# Patient Record
Sex: Female | Born: 1975 | Race: White | Hispanic: No | Marital: Married | Smoking: Never smoker
Health system: Southern US, Community
[De-identification: ages and names within clinical notes are randomized; demographics above are authoritative.]

## PROBLEM LIST (undated history)

## (undated) ENCOUNTER — Emergency Department: Admission: EM | Discharge status: Absent without leave | Payer: BC Managed Care – PPO

## (undated) DIAGNOSIS — F329 Major depressive disorder, single episode, unspecified: Secondary | ICD-10-CM

## (undated) DIAGNOSIS — R259 Unspecified abnormal involuntary movements: Secondary | ICD-10-CM

## (undated) DIAGNOSIS — N183 Chronic kidney disease, stage 3 unspecified: Secondary | ICD-10-CM

## (undated) DIAGNOSIS — H547 Unspecified visual loss: Secondary | ICD-10-CM

## (undated) DIAGNOSIS — R251 Tremor, unspecified: Secondary | ICD-10-CM

## (undated) DIAGNOSIS — G43909 Migraine, unspecified, not intractable, without status migrainosus: Secondary | ICD-10-CM

## (undated) DIAGNOSIS — F32A Depression, unspecified: Secondary | ICD-10-CM

## (undated) DIAGNOSIS — I10 Essential (primary) hypertension: Secondary | ICD-10-CM

## (undated) DIAGNOSIS — E119 Type 2 diabetes mellitus without complications: Secondary | ICD-10-CM

## (undated) HISTORY — PX: OTHER SURGICAL HISTORY: SHX169

## (undated) HISTORY — PX: TUBAL LIGATION: SHX77

## (undated) HISTORY — PX: FRACTURE SURGERY: SHX138

---

## 2004-03-11 ENCOUNTER — Ambulatory Visit: Payer: Self-pay | Admitting: Chiropractic Medicine

## 2005-09-08 ENCOUNTER — Ambulatory Visit: Payer: Self-pay | Admitting: Obstetrics and Gynecology

## 2006-02-20 ENCOUNTER — Encounter: Payer: Self-pay | Admitting: Maternal & Fetal Medicine

## 2006-02-24 ENCOUNTER — Ambulatory Visit: Payer: Self-pay | Admitting: Obstetrics and Gynecology

## 2006-03-01 ENCOUNTER — Ambulatory Visit: Payer: Self-pay | Admitting: Obstetrics and Gynecology

## 2006-03-02 ENCOUNTER — Ambulatory Visit: Payer: Self-pay | Admitting: Obstetrics and Gynecology

## 2006-03-03 ENCOUNTER — Ambulatory Visit: Payer: Self-pay | Admitting: Obstetrics and Gynecology

## 2007-07-23 ENCOUNTER — Encounter: Payer: Self-pay | Admitting: Obstetrics and Gynecology

## 2007-07-30 ENCOUNTER — Encounter: Payer: Self-pay | Admitting: Maternal and Fetal Medicine

## 2007-08-06 ENCOUNTER — Encounter: Payer: Self-pay | Admitting: Maternal & Fetal Medicine

## 2007-08-13 ENCOUNTER — Encounter: Payer: Self-pay | Admitting: Maternal & Fetal Medicine

## 2007-09-03 ENCOUNTER — Encounter: Payer: Self-pay | Admitting: Obstetrics and Gynecology

## 2007-09-27 ENCOUNTER — Encounter: Payer: Self-pay | Admitting: Maternal and Fetal Medicine

## 2007-10-01 ENCOUNTER — Encounter: Payer: Self-pay | Admitting: Maternal & Fetal Medicine

## 2007-10-08 ENCOUNTER — Encounter: Payer: Self-pay | Admitting: Maternal & Fetal Medicine

## 2007-10-15 ENCOUNTER — Encounter: Payer: Self-pay | Admitting: Maternal & Fetal Medicine

## 2007-10-22 ENCOUNTER — Encounter: Payer: Self-pay | Admitting: Maternal & Fetal Medicine

## 2007-11-19 ENCOUNTER — Observation Stay: Payer: Self-pay | Admitting: Obstetrics and Gynecology

## 2008-08-16 ENCOUNTER — Ambulatory Visit: Payer: Self-pay | Admitting: Family Medicine

## 2010-09-03 ENCOUNTER — Emergency Department: Payer: Self-pay | Admitting: Emergency Medicine

## 2011-10-14 DIAGNOSIS — E119 Type 2 diabetes mellitus without complications: Secondary | ICD-10-CM | POA: Insufficient documentation

## 2012-03-12 ENCOUNTER — Emergency Department: Payer: Self-pay | Admitting: Emergency Medicine

## 2012-03-12 LAB — CK TOTAL AND CKMB (NOT AT ARMC): CK, Total: 49 U/L (ref 21–215)

## 2012-03-12 LAB — BASIC METABOLIC PANEL
Anion Gap: 10 (ref 7–16)
BUN: 13 mg/dL (ref 7–18)
Chloride: 101 mmol/L (ref 98–107)
Glucose: 345 mg/dL — ABNORMAL HIGH (ref 65–99)
Osmolality: 284 (ref 275–301)
Potassium: 4.2 mmol/L (ref 3.5–5.1)

## 2012-03-12 LAB — CBC
HCT: 38.2 % (ref 35.0–47.0)
HGB: 13.2 g/dL (ref 12.0–16.0)
MCHC: 34.5 g/dL (ref 32.0–36.0)
RBC: 4.46 10*6/uL (ref 3.80–5.20)
WBC: 8.4 10*3/uL (ref 3.6–11.0)

## 2012-03-12 LAB — TROPONIN I: Troponin-I: <0.02 ng/mL

## 2012-03-13 LAB — URINALYSIS, COMPLETE
Bilirubin,UR: NEGATIVE
Ketone: NEGATIVE
Leukocyte Esterase: NEGATIVE
Nitrite: POSITIVE
Ph: 5 (ref 4.5–8.0)
Protein: NEGATIVE
RBC,UR: NONE SEEN /HPF (ref 0–5)
WBC UR: 1 /HPF (ref 0–5)

## 2012-10-09 ENCOUNTER — Emergency Department: Payer: Self-pay | Admitting: Emergency Medicine

## 2012-10-12 LAB — BETA STREP CULTURE(ARMC)

## 2013-09-23 ENCOUNTER — Emergency Department: Payer: Self-pay | Admitting: Emergency Medicine

## 2013-12-29 ENCOUNTER — Emergency Department: Payer: Self-pay | Admitting: Emergency Medicine

## 2013-12-29 LAB — TROPONIN I: Troponin-I: <0.02 ng/mL

## 2013-12-29 LAB — BASIC METABOLIC PANEL
Anion Gap: 9 (ref 7–16)
BUN: 9 mg/dL (ref 7–18)
CALCIUM: 8.5 mg/dL (ref 8.5–10.1)
CHLORIDE: 106 mmol/L (ref 98–107)
CREATININE: 0.94 mg/dL (ref 0.60–1.30)
Co2: 24 mmol/L (ref 21–32)
EGFR (African American): >60
EGFR (Non-African Amer.): >60
Glucose: 384 mg/dL — ABNORMAL HIGH (ref 65–99)
OSMOLALITY: 292 (ref 275–301)
POTASSIUM: 3.9 mmol/L (ref 3.5–5.1)
Sodium: 139 mmol/L (ref 136–145)

## 2013-12-29 LAB — CBC
HCT: 39.8 % (ref 35.0–47.0)
HGB: 13 g/dL (ref 12.0–16.0)
MCH: 29.2 pg (ref 26.0–34.0)
MCHC: 32.7 g/dL (ref 32.0–36.0)
MCV: 89 fL (ref 80–100)
Platelet: 231 10*3/uL (ref 150–440)
RBC: 4.45 10*6/uL (ref 3.80–5.20)
RDW: 12.9 % (ref 11.5–14.5)
WBC: 8.3 10*3/uL (ref 3.6–11.0)

## 2014-04-23 ENCOUNTER — Ambulatory Visit: Payer: Self-pay | Admitting: Family Medicine

## 2015-05-01 ENCOUNTER — Ambulatory Visit (INDEPENDENT_AMBULATORY_CARE_PROVIDER_SITE_OTHER): Payer: BLUE CROSS/BLUE SHIELD

## 2015-05-01 ENCOUNTER — Ambulatory Visit
Admission: EM | Admit: 2015-05-01 | Discharge: 2015-05-01 | Disposition: A | Discharge status: Home / Self Care | Payer: BLUE CROSS/BLUE SHIELD | Attending: Family Medicine | Admitting: Family Medicine

## 2015-05-01 ENCOUNTER — Encounter: Payer: Self-pay | Admitting: Emergency Medicine

## 2015-05-01 DIAGNOSIS — J019 Acute sinusitis, unspecified: Secondary | ICD-10-CM | POA: Diagnosis not present

## 2015-05-01 DIAGNOSIS — J209 Acute bronchitis, unspecified: Secondary | ICD-10-CM

## 2015-05-01 DIAGNOSIS — J029 Acute pharyngitis, unspecified: Secondary | ICD-10-CM

## 2015-05-01 HISTORY — DX: Essential (primary) hypertension: I10

## 2015-05-01 HISTORY — DX: Type 2 diabetes mellitus without complications: E11.9

## 2015-05-01 LAB — RAPID INFLUENZA A&B ANTIGENS (ARMC ONLY)
INFLUENZA A (ARMC): NOT DETECTED
INFLUENZA B (ARMC): NOT DETECTED

## 2015-05-01 LAB — RAPID STREP SCREEN (MED CTR MEBANE ONLY): STREPTOCOCCUS, GROUP A SCREEN (DIRECT): NEGATIVE

## 2015-05-01 MED ORDER — IPRATROPIUM-ALBUTEROL 0.5-2.5 (3) MG/3ML IN SOLN
3.0000 mL | Freq: Four times a day (QID) | RESPIRATORY_TRACT | Status: DC
  Administered 2015-05-01: 3 mL via RESPIRATORY_TRACT

## 2015-05-01 MED ORDER — SALINE SPRAY 0.65 % NA SOLN: 2.0000 | NASAL | Status: DC

## 2015-05-01 MED ORDER — ALBUTEROL SULFATE HFA 108 (90 BASE) MCG/ACT IN AERS: 1.0000 | INHALATION_SPRAY | Freq: Four times a day (QID) | RESPIRATORY_TRACT | Status: DC | PRN

## 2015-05-01 MED ORDER — FLUTICASONE PROPIONATE 50 MCG/ACT NA SUSP: 1.0000 | Freq: Two times a day (BID) | NASAL | Status: DC

## 2015-05-01 MED ORDER — BENZONATATE 200 MG PO CAPS: 200.0000 mg | ORAL_CAPSULE | Freq: Three times a day (TID) | ORAL | Status: AC | PRN

## 2015-05-01 MED ORDER — DOXYCYCLINE HYCLATE 50 MG PO CAPS: 50.0000 mg | ORAL_CAPSULE | Freq: Two times a day (BID) | ORAL | Status: AC

## 2015-05-01 NOTE — Discharge Instructions (Signed)
Acute Bronchitis Bronchitis is inflammation of the airways that extend from the windpipe into the lungs (bronchi). The inflammation often causes mucus to develop. This leads to a cough, which is the most common symptom of bronchitis.  In acute bronchitis, the condition usually develops suddenly and goes away over time, usually in a couple weeks. Smoking, allergies, and asthma can make bronchitis worse. Repeated episodes of bronchitis may cause further lung problems.  CAUSES Acute bronchitis is most often caused by the same virus that causes a cold. The virus can spread from person to person (contagious) through coughing, sneezing, and touching contaminated objects. SIGNS AND SYMPTOMS  1. Cough.  2. Fever.  3. Coughing up mucus.  4. Body aches.  5. Chest congestion.  6. Chills.  7. Shortness of breath.  8. Sore throat.  DIAGNOSIS  Acute bronchitis is usually diagnosed through a physical exam. Your health care provider will also ask you questions about your medical history. Tests, such as chest X-rays, are sometimes done to rule out other conditions.  TREATMENT  Acute bronchitis usually goes away in a couple weeks. Oftentimes, no medical treatment is necessary. Medicines are sometimes given for relief of fever or cough. Antibiotic medicines are usually not needed but may be prescribed in certain situations. In some cases, an inhaler may be recommended to help reduce shortness of breath and control the cough. A cool mist vaporizer may also be used to help thin bronchial secretions and make it easier to clear the chest.  HOME CARE INSTRUCTIONS  Get plenty of rest.   Drink enough fluids to keep your urine clear or pale yellow (unless you have a medical condition that requires fluid restriction). Increasing fluids may help thin your respiratory secretions (sputum) and reduce chest congestion, and it will prevent dehydration.   Take medicines only as directed by your health care  provider.  If you were prescribed an antibiotic medicine, finish it all even if you start to feel better.  Avoid smoking and secondhand smoke. Exposure to cigarette smoke or irritating chemicals will make bronchitis worse. If you are a smoker, consider using nicotine gum or skin patches to help control withdrawal symptoms. Quitting smoking will help your lungs heal faster.   Reduce the chances of another bout of acute bronchitis by washing your hands frequently, avoiding people with cold symptoms, and trying not to touch your hands to your mouth, nose, or eyes.   Keep all follow-up visits as directed by your health care provider.  SEEK MEDICAL CARE IF: Your symptoms do not improve after 1 week of treatment.  SEEK IMMEDIATE MEDICAL CARE IF:  You develop an increased fever or chills.   You have chest pain.   You have severe shortness of breath.  You have bloody sputum.   You develop dehydration.  You faint or repeatedly feel like you are going to pass out.  You develop repeated vomiting.  You develop a severe headache. MAKE SURE YOU:   Understand these instructions.  Will watch your condition.  Will get help right away if you are not doing well or get worse.   This information is not intended to replace advice given to you by your health care provider. Make sure you discuss any questions you have with your health care provider.   Document Released: 05/26/2004 Document Revised: 05/09/2014 Document Reviewed: 10/09/2012 Elsevier Interactive Patient Education 2016 [employer]  Pharyngitis Pharyngitis is redness, pain, and swelling (inflammation) of your pharynx.  CAUSES  Pharyngitis is usually caused  by infection. Most of the time, these infections are from viruses (viral) and are part of a cold. However, sometimes pharyngitis is caused by bacteria (bacterial). Pharyngitis can also be caused by allergies. Viral pharyngitis may be spread from person to person by  coughing, sneezing, and personal items or utensils (cups, forks, spoons, toothbrushes). Bacterial pharyngitis may be spread from person to person by more intimate contact, such as kissing.  SIGNS AND SYMPTOMS  Symptoms of pharyngitis include:  9. Sore throat.  10. Tiredness (fatigue).  11. Low-grade fever.  12. Headache. 13. Joint pain and muscle aches. 14. Skin rashes. 15. Swollen lymph nodes. 16. Plaque-like film on throat or tonsils (often seen with bacterial pharyngitis). DIAGNOSIS  Your health care provider will ask you questions about your illness and your symptoms. Your medical history, along with a physical exam, is often all that is needed to diagnose pharyngitis. Sometimes, a rapid strep test is done. Other lab tests may also be done, depending on the suspected cause.  TREATMENT  Viral pharyngitis will usually get better in 3-4 days without the use of medicine. Bacterial pharyngitis is treated with medicines that kill germs (antibiotics).  HOME CARE INSTRUCTIONS   Drink enough water and fluids to keep your urine clear or pale yellow.   Only take over-the-counter or prescription medicines as directed by your health care provider:   If you are prescribed antibiotics, make sure you finish them even if you start to feel better.   Do not take aspirin.   Get lots of rest.   Gargle with 8 oz of salt water ( tsp of salt per 1 qt of water) as often as every 1-2 hours to soothe your throat.   Throat lozenges (if you are not at risk for choking) or sprays may be used to soothe your throat. SEEK MEDICAL CARE IF:   You have large, tender lumps in your neck.  You have a rash.  You cough up green, yellow-brown, or bloody spit. SEEK IMMEDIATE MEDICAL CARE IF:   Your neck becomes stiff.  You drool or are unable to swallow liquids.  You vomit or are unable to keep medicines or liquids down.  You have severe pain that does not go away with the use of recommended  medicines.  You have trouble breathing (not caused by a stuffy nose). MAKE SURE YOU:   Understand these instructions.  Will watch your condition.  Will get help right away if you are not doing well or get worse.   This information is not intended to replace advice given to you by your health   Metered Dose Inhaler (No Spacer Used) Inhaled medicines are the basis of treatment for asthma and other breathing problems. Inhaled medicine can only be effective if used properly. Good technique assures that the medicine reaches the lungs. Metered dose inhalers (MDIs) are used to deliver a variety of inhaled medicines. These include quick relief or rescue medicines (such as bronchodilators) and controller medicines (such as corticosteroids). The medicine is delivered by pushing down on a metal canister to release a set amount of spray. If you are using different kinds of inhalers, use your quick relief medicine to open the airways 10-15 minutes before using a steroid, if instructed to do so by your health care provider. If you are unsure which inhalers to use and the order of using them, ask your health care provider, nurse, or respiratory therapist. HOW TO USE THE INHALER 17. Remove the cap from the inhaler. 18.  If you are using the inhaler for the first time, you will need to prime it. Shake the inhaler for 5 seconds and release four puffs into the air, away from your face. Ask your health care provider or pharmacist if you have questions about priming your inhaler. 19. Shake the inhaler for 5 seconds before each breath in (inhalation). 20. Position the inhaler so that the top of the canister faces up. 21. Put your index finger on the top of the medicine canister. Your thumb supports the bottom of the inhaler. 22. Open your mouth. 23. Either place the inhaler between your teeth and place your lips tightly around the mouthpiece, or hold the inhaler 1-2 inches away from your open mouth. If you are  unsure of which technique to use, ask your health care provider. 24. Breathe out (exhale) normally and as completely as possible. 25. Press the canister down with the index finger to release the medicine. 26. At the same time as the canister is pressed, inhale deeply and slowly until your lungs are completely filled. This should take 4-6 seconds. Keep your tongue down. 27. Hold the medicine in your lungs for 5-10 seconds (10 seconds is best). This helps the medicine get into the small airways of your lungs. 28. Breathe out slowly, through pursed lips. Whistling is an example of pursed lips. 29. Wait at least 1 minute between puffs. Continue with the above steps until you have taken the number of puffs your health care provider has ordered. Do not use the inhaler more than your health care provider directs you to. 30. Replace the cap on the inhaler. 31. Follow the directions from your health care provider or the inhaler insert for cleaning the inhaler. If you are using a steroid inhaler, after your last puff, rinse your mouth with water, gargle, and spit out the water. Do not swallow the water. AVOID:  Inhaling before or after starting the spray of medicine. It takes practice to coordinate your breathing with triggering the spray.  Inhaling through the nose (rather than the mouth) when triggering the spray. HOW TO DETERMINE IF YOUR INHALER IS FULL OR NEARLY EMPTY You cannot know when an inhaler is empty by shaking it. Some inhalers are now being made with dose counters. Ask your health care provider for a prescription that has a dose counter if you feel you need that extra help. If your inhaler does not have a counter, ask your health care provider to help you determine the date you need to refill your inhaler. Write the refill date on a calendar or your inhaler canister. Refill your inhaler 7-10 days before it runs out. Be sure to keep an adequate supply of medicine. This includes making sure it has  not expired, and making sure you have a spare inhaler. SEEK MEDICAL CARE IF:  Symptoms are only partially relieved with your inhaler.  You are having trouble using your inhaler.  You experience an increase in phlegm. SEEK IMMEDIATE MEDICAL CARE IF:  You feel little or no relief with your inhalers. You are still wheezing and feeling shortness of breath, tightness in your chest, or both.  You have dizziness, headaches, or a fast heart rate.  You have chills, fever, or night sweats.  There is a noticeable increase in phlegm production, or there is blood in the phlegm. MAKE SURE YOU:  Understand these instructions.  Will watch your condition.  Will get help right away if you are not doing well or get worse.  This information is not intended to replace advice given to you by your health care provider. Make sure you discuss any questions you have with your health care provider.   Document Released: 02/13/2007 Document Revised: 05/09/2014 Document Reviewed: 10/04/2012 Elsevier Interactive Patient Education 2016 [employer] Otitis Media With Effusion Otitis media with effusion is the presence of fluid in the middle ear. This is a common problem in children, which often follows ear infections. It may be present for weeks or longer after the infection. Unlike an acute ear infection, otitis media with effusion refers only to fluid behind the ear drum and not infection. Children with repeated ear and sinus infections and allergy problems are the most likely to get otitis media with effusion. CAUSES  The most frequent cause of the fluid buildup is dysfunction of the eustachian tubes. These are the tubes that drain fluid in the ears to the back of the nose (nasopharynx). SYMPTOMS  32. The main symptom of this condition is hearing loss. As a result, you or your child may: 1. Listen to the TV at a loud volume. 2. Not respond to questions. 3. Ask "what" often when spoken to. 4. Mistake or  confuse one sound or word for another. 33. There may be a sensation of fullness or pressure but usually not pain. DIAGNOSIS   Your health care provider will diagnose this condition by examining you or your child's ears.  Your health care provider may test the pressure in you or your child's ear with a tympanometer.  A hearing test may be conducted if the problem persists. TREATMENT   Treatment depends on the duration and the effects of the effusion.  Antibiotics, decongestants, nose drops, and cortisone-type drugs (tablets or nasal spray) may not be helpful.  Children with persistent ear effusions may have delayed language or behavioral problems. Children at risk for developmental delays in hearing, learning, and speech may require referral to a specialist earlier than children not at risk.  You or your child's health care provider may suggest a referral to an ear, nose, and throat surgeon for treatment. The following may help restore normal hearing:  Drainage of fluid.  Placement of ear tubes (tympanostomy tubes).  Removal of adenoids (adenoidectomy). HOME CARE INSTRUCTIONS   Avoid secondhand smoke.  Infants who are breastfed are less likely to have this condition.  Avoid feeding infants while they are lying flat.  Avoid known environmental allergens.  Avoid people who are sick. SEEK MEDICAL CARE IF:   Hearing is not better in 3 months.  Hearing is worse.  Ear pain.  Drainage from the ear.  Dizziness. MAKE SURE YOU:   Understand these instructions.  Will watch your condition.  Will get help right away if you are not doing well or get worse.   This information is not intended to replace advice given to you by your health care provider. Make sure you discuss any questions you have with your health care provider.   Document Released: 05/26/2004 Document Revised: 05/09/2014 Document Reviewed: 11/13/2012 Elsevier Interactive Patient Education 2016 [employer] Sinusitis, Adult Sinusitis is redness, soreness, and inflammation of the paranasal sinuses. Paranasal sinuses are air pockets within the bones of your face. They are located beneath your eyes, in the middle of your forehead, and above your eyes. In healthy paranasal sinuses, mucus is able to drain out, and air is able to circulate through them by way of your nose. However, when your paranasal sinuses are inflamed, mucus and air  can become trapped. This can allow bacteria and other germs to grow and cause infection. Sinusitis can develop quickly and last only a short time (acute) or continue over a long period (chronic). Sinusitis that lasts for more than 12 weeks is considered chronic. CAUSES Causes of sinusitis include: 34. Allergies. 35. Structural abnormalities, such as displacement of the cartilage that separates your nostrils (deviated septum), which can decrease the air flow through your nose and sinuses and affect sinus drainage. 36. Functional abnormalities, such as when the small hairs (cilia) that line your sinuses and help remove mucus do not work properly or are not present. SIGNS AND SYMPTOMS Symptoms of acute and chronic sinusitis are the same. The primary symptoms are pain and pressure around the affected sinuses. Other symptoms include:  Upper toothache.  Earache.  Headache.  Bad breath.  Decreased sense of smell and taste.  A cough, which worsens when you are lying flat.  Fatigue.  Fever.  Thick drainage from your nose, which often is green and may contain pus (purulent).  Swelling and warmth over the affected sinuses. DIAGNOSIS Your health care provider will perform a physical exam. During your exam, your health care provider may perform any of the following to help determine if you have acute sinusitis or chronic sinusitis:  Look in your nose for signs of abnormal growths in your nostrils (nasal polyps).  Tap over the affected sinus to check for signs of  infection.  View the inside of your sinuses using an imaging device that has a light attached (endoscope). If your health care provider suspects that you have chronic sinusitis, one or more of the following tests may be recommended:  Allergy tests.  Nasal culture. A sample of mucus is taken from your nose, sent to a lab, and screened for bacteria.  Nasal cytology. A sample of mucus is taken from your nose and examined by your health care provider to determine if your sinusitis is related to an allergy. TREATMENT Most cases of acute sinusitis are related to a viral infection and will resolve on their own within 10 days. Sometimes, medicines are prescribed to help relieve symptoms of both acute and chronic sinusitis. These may include pain medicines, decongestants, nasal steroid sprays, or saline sprays. However, for sinusitis related to a bacterial infection, your health care provider will prescribe antibiotic medicines. These are medicines that will help kill the bacteria causing the infection. Rarely, sinusitis is caused by a fungal infection. In these cases, your health care provider will prescribe antifungal medicine. For some cases of chronic sinusitis, surgery is needed. Generally, these are cases in which sinusitis recurs more than 3 times per year, despite other treatments. HOME CARE INSTRUCTIONS  Drink plenty of water. Water helps thin the mucus so your sinuses can drain more easily.  Use a humidifier.  Inhale steam 3-4 times a day (for example, sit in the bathroom with the shower running).  Apply a warm, moist washcloth to your face 3-4 times a day, or as directed by your health care provider.  Use saline nasal sprays to help moisten and clean your sinuses.  Take medicines only as directed by your health care provider.  If you were prescribed either an antibiotic or antifungal medicine, finish it all even if you start to feel better. SEEK IMMEDIATE MEDICAL CARE IF:  You have  increasing pain or severe headaches.  You have nausea, vomiting, or drowsiness.  You have swelling around your face.  You have vision problems.  You  have a stiff neck.  You have difficulty breathing.   This information is not intended to replace advice given to you by your health care provider. Make sure you discuss any questions you have with your health care provider.   Document Released: 04/18/2005 Document Revised: 05/09/2014 Document Reviewed: 05/03/2011 [employer] Interactive Patient Education [employer] care provider. Make sure you discuss any questions you have with your health care provider.   Document Released: 04/18/2005 Document Revised: 02/06/2013 Document Reviewed: 12/24/2012 [employer] Interactive Patient Education [employer]

## 2015-05-01 NOTE — ED Notes (Signed)
Cough, SOB, for 2 days

## 2015-05-01 NOTE — ED Provider Notes (Addendum)
CSN: [id number]     Arrival date & time 05/01/15  1707 History   First MD Initiated Contact with Patient 05/01/15 1850     Chief Complaint  Patient presents with  . Cough   (Consider location/radiation/quality/duration/timing/severity/associated sxs/prior Treatment) HPI Comments: Married caucasian female here for evaluation of shortness of breath after coughing.  FS 250-300 the past couple of mornings.  Usually 150-175 taking insuline as prescribed  Cough nonproductive dry, body aches, frontal headache.  wheezing only today denied asthma history Diarrhea Tuesday now resolvedPSHx BTL, c-sections x 2 works as Conservation officer, nature at [employer] Also on a blood pressure medication not 100% sure but thinks it is lisinopril 10mg  po daily  The history is provided by the patient and a relative.    Past Medical History  Diagnosis Date  . Diabetes mellitus without complication (HCC)   . Hypertension    Past Surgical History  Procedure Laterality Date  . Cesarean section    . Tubiligation     History reviewed. No pertinent family history. Social History  Substance Use Topics  . Smoking status: Never Smoker   . Smokeless tobacco: Never Used  . Alcohol Use: No   OB History    No data available     Review of Systems  Constitutional: Negative for fever, chills, diaphoresis, activity change, appetite change, fatigue and unexpected weight change.  HENT: Positive for congestion, ear pain, nosebleeds, postnasal drip, rhinorrhea, sinus pressure and sore throat. Negative for dental problem, drooling, ear discharge, facial swelling, hearing loss, mouth sores, sneezing, tinnitus, trouble swallowing and voice change.   Eyes: Negative for photophobia, pain, discharge, redness, itching and visual disturbance.  Respiratory: Positive for cough and wheezing. Negative for choking, chest tightness, shortness of breath and stridor.   Cardiovascular: Negative for chest pain, palpitations and leg swelling.  Gastrointestinal:  Positive for diarrhea. Negative for nausea, vomiting, abdominal pain, constipation, blood in stool and abdominal distention.  Endocrine: Negative for cold intolerance and heat intolerance.  Genitourinary: Negative for dysuria, hematuria and difficulty urinating.  Musculoskeletal: Negative for myalgias, back pain, joint swelling, arthralgias, gait problem, neck pain and neck stiffness.  Skin: Negative for color change, pallor, rash and wound.  Allergic/Immunologic: Positive for immunocompromised state. Negative for environmental allergies and food allergies.  Neurological: Positive for headaches. Negative for dizziness, tremors, seizures, syncope, facial asymmetry, speech difficulty, weakness, light-headedness and numbness.  Hematological: Negative for adenopathy. Does not bruise/bleed easily.  Psychiatric/Behavioral: Negative for behavioral problems, confusion, sleep disturbance and agitation.    Allergies  Review of patient's allergies indicates no known allergies.  Home Medications   Prior to Admission medications   Medication Sig Start Date End Date Taking? Authorizing Provider  insulin glargine (LANTUS) 100 UNIT/ML injection Inject into the skin at bedtime.   Yes Historical Provider, MD  insulin lispro (HUMALOG) 100 UNIT/ML injection Inject into the skin 3 (three) times daily before meals.   Yes Historical Provider, MD  albuterol (PROVENTIL HFA;VENTOLIN HFA) 108 (90 Base) MCG/ACT inhaler Inhale 1-2 puffs into the lungs every 6 (six) hours as needed for wheezing or shortness of breath. 05/01/15   Barbaraann Barthel, NP  benzonatate (TESSALON) 200 MG capsule Take 1 capsule (200 mg total) by mouth 3 (three) times daily as needed for cough. 05/01/15 05/15/15  Barbaraann Barthel, NP  doxycycline (VIBRAMYCIN) 50 MG capsule Take 1 capsule (50 mg total) by mouth 2 (two) times daily. 05/01/15 05/10/15  Barbaraann Barthel, NP  fluticasone (FLONASE) 50 MCG/ACT nasal spray Place 1  spray into both nostrils 2  (two) times daily. 05/01/15   Barbaraann Barthel, NP  sodium chloride (OCEAN) 0.65 % SOLN nasal spray Place 2 sprays into both nostrils every 2 (two) hours while awake. 05/01/15 06/01/15  Barbaraann Barthel, NP   Meds Ordered and Administered this Visit   Medications  ipratropium-albuterol (DUONEB) 0.5-2.5 (3) MG/3ML nebulizer solution 3 mL (3 mLs Nebulization Given 05/01/15 1903)    BP 156/79 mmHg  Pulse 76  Temp(Src) 98 F (36.7 C)  Resp 18  Ht 5\' 7"  (1.702 m)  Wt 224 lb 14.4 oz (102.014 kg)  BMI 35.22 kg/m2  SpO2 100%  LMP 04/13/2015 No data found.   Physical Exam  Constitutional: She is oriented to person, place, and time. She appears well-developed and well-nourished. She is active and cooperative.  Non-toxic appearance. She does not have a sickly appearance. She appears ill. No distress.  HENT:  Head: Normocephalic and atraumatic.  Right Ear: Hearing, external ear and ear canal normal. A middle ear effusion is present.  Left Ear: Hearing, external ear and ear canal normal. A middle ear effusion is present.  Nose: Mucosal edema and rhinorrhea present. No nose lacerations, sinus tenderness, nasal deformity, septal deviation or nasal septal hematoma. No epistaxis.  No foreign bodies. Right sinus exhibits frontal sinus tenderness. Right sinus exhibits no maxillary sinus tenderness. Left sinus exhibits frontal sinus tenderness. Left sinus exhibits no maxillary sinus tenderness.  Mouth/Throat: Uvula is midline and mucous membranes are normal. Mucous membranes are not pale, not dry and not cyanotic. She does not have dentures. No oral lesions. No trismus in the jaw. Normal dentition. No dental abscesses, uvula swelling, lacerations or dental caries. Posterior oropharyngeal edema and posterior oropharyngeal erythema present. No oropharyngeal exudate or tonsillar abscesses.  Tonsils enlarged erythema edema 2+ bilaterally; cobblestoning posterior pharynx; nasal congestion yellow discharge  bilaterally turbinates; bilateral TMs with air fluid level slight opacity  Eyes: Conjunctivae, EOM and lids are normal. Pupils are equal, round, and reactive to light. Right eye exhibits no chemosis, no discharge, no exudate and no hordeolum. No foreign body present in the right eye. Left eye exhibits no chemosis, no discharge, no exudate and no hordeolum. No foreign body present in the left eye. Right conjunctiva is not injected. Right conjunctiva has no hemorrhage. Left conjunctiva is not injected. Left conjunctiva has no hemorrhage. No scleral icterus. Right eye exhibits normal extraocular motion and no nystagmus. Left eye exhibits normal extraocular motion and no nystagmus. Right pupil is round and reactive. Left pupil is round and reactive. Pupils are equal.  Neck: Trachea normal and normal range of motion. Neck supple. No tracheal tenderness, no spinous process tenderness and no muscular tenderness present. No rigidity. No tracheal deviation, no edema, no erythema and normal range of motion present. No thyroid mass and no thyromegaly present.  Cardiovascular: Normal rate, regular rhythm, S1 normal, S2 normal, normal heart sounds and intact distal pulses.  PMI is not displaced.  Exam reveals no gallop and no friction rub.   No murmur heard. Pulmonary/Chest: Effort normal. No accessory muscle usage or stridor. No respiratory distress. She has decreased breath sounds in the right lower field and the left lower field. She has no wheezes. She has no rhonchi. She has no rales. She exhibits no tenderness.  Negative egophany all fields; BLL decreased breath sounds prior to duoneb improved after duoneb administration; intermittent cough with talking and deep breathing prior to duoneb  Abdominal: Soft. She exhibits no distension.  Musculoskeletal:  Normal range of motion. She exhibits no edema or tenderness.       Right shoulder: Normal.       Left shoulder: Normal.       Right hip: Normal.       Left hip:  Normal.       Right knee: Normal.       Left knee: Normal.       Cervical back: Normal.       Right hand: Normal.       Left hand: Normal.  Lymphadenopathy:       Head (right side): No submental, no submandibular, no tonsillar, no preauricular, no posterior auricular and no occipital adenopathy present.       Head (left side): No submental, no submandibular, no tonsillar, no preauricular, no posterior auricular and no occipital adenopathy present.    She has no cervical adenopathy.       Right cervical: No superficial cervical, no deep cervical and no posterior cervical adenopathy present.      Left cervical: No superficial cervical, no deep cervical and no posterior cervical adenopathy present.  Neurological: She is alert and oriented to person, place, and time. She has normal strength. She is not disoriented. She displays no atrophy and no tremor. No cranial nerve deficit or sensory deficit. She exhibits normal muscle tone. She displays no seizure activity. Coordination and gait normal. GCS eye subscore is 4. GCS verbal subscore is 5. GCS motor subscore is 6.  Skin: Skin is warm, dry and intact. No abrasion, no bruising, no burn, no ecchymosis, no laceration, no lesion, no petechiae and no rash noted. She is not diaphoretic. No cyanosis or erythema. No pallor. Nails show no clubbing.  Psychiatric: She has a normal mood and affect. Her speech is normal and behavior is normal. Judgment and thought content normal. Cognition and memory are normal.  Nursing note and vitals reviewed.  Daughter in room with patient; urine smell in room; patient wearing mask ED Course  Procedures (including critical care time)  Labs Review Labs Reviewed  R[facility])  RAPID INFLUENZA A&B ANTIGENS ([facility] ONLY)  CULTURE, GROUP A STREP ([facility] ONLY)    Imaging Review Dg Chest 2 View  05/01/2015  CLINICAL DATA:  Pt with chest pain and cough x two days. No hx of trauma, injury, surgery or asthma.  EXAM: CHEST  2 VIEW COMPARISON:  12/29/2013 FINDINGS: The heart size and mediastinal contours are within normal limits. Both lungs are clear. The visualized skeletal structures are unremarkable. IMPRESSION: No active cardiopulmonary disease. Electronically Signed   By: Norva PavlovElizabeth  Brown M.D.   On: 05/01/2015 19:23    1900 duoneb 3ml po administered by CMA Riesa PopeHelen Jeffries, chest xray pending.  1950 patient feeling better after duoneb sp02 100% room air HR 78 cough frequency decreased speaking in full sentences with children and spouse in room.  Discussed rapid flu and strep negative.  Throat culture results typically available in 48 hours will call once available.  Chest xray negative for pneumonia/fluid given copy of radiology report and discussed with patient.  Patient verbalized understanding of information/instructions, agreed with plan of crea and had no further questions at this time.    MDM   1. Acute bronchitis due to Haemophilus influenzae   2. Acute rhinosinusitis   3. Acute pharyngitis, unspecified pharyngitis type    Patient notified rapid strep and flu negative.  Suspect Viral illness: no evidence of invasive bacterial infection, non toxic and well  hydrated.  This is most likely self limiting viral infection.  I do not see where any further testing or imaging is necessary at this time.   I will suggest supportive care, rest, good hygiene and encourage the patient to take adequate fluids.   Notified patient staff will call with culture results once available next 48+ hours.  flonase 1 spray each nostril BID prn, nasal saline 1-2 sprays each nostril prn q2h, tylenol  po QID prn.  Discussed honey with lemon and salt water gargles for comfort also.  The patient is to return to clinic or EMERGENCY ROOM if symptoms worsen or change significantly e.g. fever, lethargy, SOB, wheezing.  Exitcare handout on viral illness given to patient.  Work excuse given 28-30 Dec.  Patient verbalized  agreement and understanding of treatment plan and had no further questions at this time.    Patient does not like nose sprays but sill try flonase 1 spray each nostril BID and nasal saline 2 sprays each nostril q2h prn congestion.  If no improvement x 48 hours use to start doxycycline  po BID x 10 days.  No evidence of systemic bacterial infection, non toxic and well hydrated.  I do not see where any further testing or imaging is necessary at this time.   I will suggest supportive care, rest, good hygiene and encourage the patient to take adequate fluids.  The patient is to return to clinic or EMERGENCY ROOM if symptoms worsen or change significantly.  Exitcare handout on sinusitis given to patient.  Patient verbalized agreement and understanding of treatment plan and had no further questions at this time.   P2:  Hand washing and cover cough  Will cover for atypical bronchitis with doxycycline  po BID.  Tessalon pearles  po TID prn and albuterol 1-2 puffs po q4-6h prn protracted cough/wheezing/chest tightness.  Holding on prednisone at this time due to diabetes on insulin with elevated blood sugars > 200 already.  Bronchitis simple, community acquired, may have started as viral (probably respiratory syncytial, parainfluenza, influenza, or adenovirus), but now evidence of acute purulent bronchitis with resultant bronchial edema and mucus formation.  Viruses are the most common cause of bronchial inflammation in otherwise healthy adults with acute bronchitis.  The appearance of sputum is not predictive of whether a bacterial infection is present.  Purulent sputum is most often caused by viral infections.  There are a small portion of those caused by non-viral agents being Mycoplamsa pneumonia.  Microscopic examination or C&S of sputum in the healthy adult with acute bronchitis is generally not helpful (usually negative or normal respiratory flora) other considerations being cough from upper  respiratory tract infections, sinusitis or allergic syndromes (mild asthma or viral pneumonia).  Differential Diagnosis:  reactive airway disease (asthma, allergic aspergillosis (eosinophilia), chronic bronchitis, respiratory infection (Sinusitis, Common cold, pneumonia), congestive heart failure, reflux esophagitis, bronchogenic tumor, aspiration syndromes and/or exposure irritants/tobacco smoke.  In this case, there is no evidence of any invasive bacterial illness.  Most likely viral etiology so will hold on antibiotic treatment.  Advise supportive care with rest, encourage fluids, good hygiene and watch for any worsening symptoms.  If they were to develop:  come back to the office or go to the emergency room if after hours. Without high fever, severe dyspnea, lack of physical findings or other risk factors, I will hold on CBC at this time.  I discussed that approximately 50% of patients with acute bronchitis have a cough that lasts up to three  weeks, and 25% for over a month.  Tylenol, one to two tablets every four hours as needed for fever or myalgias.   No aspirin.  Patient instructed to follow up in one week or sooner if symptoms worsen. Patient verbalized agreement and understanding of treatment plan and had no further questions at this time.  P2:  hand washing and cover cough  School/work excuse note given to patient for 72 hours as had missed work 28-30 Dec.  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  Has Rx for doxycycline for atypical bronchitis and sinusitis.  Discussed with patient if sore throat not improving with prescribed therapy x 48 hours and strep positive may need to add penicillin to her regimen.    Usually no specific medical treatment is needed if a virus is causing the sore throat.  The throat most often gets better on its own within 5 to 7 days.  Antibiotic medicine does not cure viral pharyngitis.   For acute pharyngitis caused by  bacteria, your healthcare provider will prescribe an antibiotic.  Marland Kitchen Do not smoke.  Marland Kitchen Avoid secondhand smoke and other air pollutants.  . Use a cool mist humidifier to add moisture to the air.  . Get plenty of rest.  . You may want to rest your throat by talking less and eating a diet that is mostly liquid or soft for a day or two.   Marland Kitchen Nonprescription throat lozenges and mouthwashes should help relieve the soreness.   . Gargling with warm saltwater and drinking warm liquids may help.  (You can make a saltwater solution by adding 1/4 teaspoon of salt to 8 ounces, or 240 mL, of warm water.)  . A nonprescription pain reliever such as aspirin, acetaminophen, or ibuprofen may ease general aches and pains.   FOLLOW UP with clinic provider if no improvements in the next 7-10 days.  exitcare handout on pharyngitis given to patient.  Patient verbalized understanding of instructions and agreed with plan of care. P2:  Hand washing and diet.     Barbaraann Barthel, NP 05/01/15 2038  05 May 2015 at 1020 Patient notified via telephone throat culture positive strep agalactiae.  Patient reported she started doxycycline and symptoms improved feeling much better.  Patient verbalized understanding of information/instructions, agreed with plan of care and had no further questions at this time.  Barbaraann Barthel, NP 05/05/15 1021

## 2015-05-06 ENCOUNTER — Telehealth: Payer: Self-pay | Admitting: *Deleted

## 2015-05-06 LAB — CULTURE, GROUP A STREP (THRC)

## 2015-05-06 NOTE — ED Notes (Signed)
Patient called, no answer, voicemail not available to leave message.

## 2015-06-28 ENCOUNTER — Ambulatory Visit
Admission: EM | Admit: 2015-06-28 | Discharge: 2015-06-28 | Disposition: A | Discharge status: Home / Self Care | Payer: BLUE CROSS/BLUE SHIELD | Attending: Family Medicine | Admitting: Family Medicine

## 2015-06-28 ENCOUNTER — Encounter: Payer: Self-pay | Admitting: Gynecology

## 2015-06-28 DIAGNOSIS — L02212 Cutaneous abscess of back [any part, except buttock]: Secondary | ICD-10-CM

## 2015-06-28 MED ORDER — SULFAMETHOXAZOLE-TRIMETHOPRIM 800-160 MG PO TABS: 1.0000 | ORAL_TABLET | Freq: Two times a day (BID) | ORAL | Status: DC

## 2015-06-28 NOTE — ED Provider Notes (Signed)
CSN: [id number]     Arrival date & time 06/28/15  2725 History   First MD Initiated Contact with Patient 06/28/15 1138     Chief Complaint  Patient presents with  . Recurrent Skin Infections   (Consider location/radiation/quality/duration/timing/severity/associated sxs/prior Treatment) Patient is a 40 y.o. female presenting with abscess. The history is provided by the patient.  Abscess Location:  Torso Torso abscess location:  Lower back Abscess quality: fluctuance, painful, redness and warmth   Abscess quality: not draining and not weeping   Red streaking: no   Duration:  3 days Progression:  Worsening Pain details:    Quality:  Throbbing   Severity:  Moderate   Duration:  2 days   Timing:  Constant Chronicity:  New Context: diabetes   Context: not immunosuppression, not injected drug use, not insect bite/sting and not skin injury   Relieved by:  Nothing Ineffective treatments:  Warm water soaks Associated symptoms: no anorexia, no fatigue, no fever, no headaches, no nausea and no vomiting   Risk factors: no family hx of MRSA and no hx of MRSA     Past Medical History  Diagnosis Date  . Diabetes mellitus without complication (HCC)   . Hypertension    Past Surgical History  Procedure Laterality Date  . Cesarean section    . Tubiligation     No family history on file. Social History  Substance Use Topics  . Smoking status: Never Smoker   . Smokeless tobacco: Never Used  . Alcohol Use: No   OB History    No data available     Review of Systems  Constitutional: Negative for fever and fatigue.  Gastrointestinal: Negative for nausea, vomiting and anorexia.  Neurological: Negative for headaches.    Allergies  Review of patient's allergies indicates no known allergies.  Home Medications   Prior to Admission medications   Medication Sig Start Date End Date Taking? Authorizing Provider  albuterol (PROVENTIL HFA;VENTOLIN HFA) 108 (90 Base) MCG/ACT inhaler Inhale  1-2 puffs into the lungs every 6 (six) hours as needed for wheezing or shortness of breath. 05/01/15  Yes Barbaraann Barthel, NP  fluticasone (FLONASE) 50 MCG/ACT nasal spray Place 1 spray into both nostrils 2 (two) times daily. 05/01/15  Yes Barbaraann Barthel, NP  insulin glargine (LANTUS) 100 UNIT/ML injection Inject into the skin at bedtime.   Yes Historical Provider, MD  insulin lispro (HUMALOG) 100 UNIT/ML injection Inject into the skin 3 (three) times daily before meals.   Yes Historical Provider, MD  metFORMIN (GLUCOPHAGE) 500 MG tablet Take 500 mg by mouth 1 day or 1 dose. 06/14/15 06/13/16  Historical Provider, MD  sodium chloride (OCEAN) 0.65 % SOLN nasal spray Place 2 sprays into both nostrils every 2 (two) hours while awake. 05/01/15 06/01/15  Barbaraann Barthel, NP  sulfamethoxazole-trimethoprim (BACTRIM DS,SEPTRA DS) 800-160 MG tablet Take 1 tablet by mouth 2 (two) times daily. 06/28/15   Payton Mccallum, MD  traMADol (ULTRAM) 50 MG tablet Take 1 tablet (50 mg total) by mouth 2 (two) times daily. 06/29/15   Charlesetta Ivory Menshew, PA-C   Meds Ordered and Administered this Visit  Medications - No data to display  BP 138/74 mmHg  Pulse 81  Temp(Src) 98.1 F (36.7 C) (Oral)  Resp 18  Ht  (1.702 m)  Wt 233 lb (105.688 kg)  BMI 36.48 kg/m2  SpO2 97%  LMP 06/03/2015 No data found.   Physical Exam  Constitutional: She appears well-developed and well-nourished.  No distress.  Skin: She is not diaphoretic. There is erythema.  3x7cm subcutaneous, fluctuant mass on left lower back, skin warmth, tenderness to palpation and blanchable erythema  Nursing note and vitals reviewed.   ED Course  Procedures (including critical care time)  Labs Review Labs Reviewed  CULTURE, ROUTINE-ABSCESS    Imaging Review No results found.   Visual Acuity Review  Right Eye Distance:   Left Eye Distance:   Bilateral Distance:    Right Eye Near:   Left Eye Near:    Bilateral Near:          MDM   1. Abscess of back    Discharge Medication List as of 06/28/2015 12:12 PM    START taking these medications   Details  sulfamethoxazole-trimethoprim (BACTRIM DS,SEPTRA DS) 800-160 MG tablet Take 1 tablet by mouth 2 (two) times daily., Starting 06/28/2015, Until Discontinued, Normal       1. diagnosis reviewed with patient; informed consent obtained for I&D; area cleaned, prepped and anesthetized with 1% lidocaine with epinephrine; small skin incision ( approx 3mm) made with #11 blade with subsequent expression of purulent material; sample obtained for culture 2. rx as per orders above; reviewed possible side effects, interactions, risks and benefits  3. Recommend supportive treatment with warm compresses to affected area 4. Follow-up prn if symptoms worsen or don't improve    Payton Mccallum, MD 07/02/15 1333

## 2015-06-28 NOTE — ED Notes (Signed)
Patient c/o boil on back x couple days.

## 2015-06-28 NOTE — Discharge Instructions (Signed)

## 2015-06-29 ENCOUNTER — Encounter: Payer: Self-pay | Admitting: Emergency Medicine

## 2015-06-29 ENCOUNTER — Emergency Department
Admission: EM | Admit: 2015-06-29 | Discharge: 2015-06-29 | Disposition: A | Discharge status: Home / Self Care | Payer: BLUE CROSS/BLUE SHIELD | Attending: Student | Admitting: Student

## 2015-06-29 DIAGNOSIS — E119 Type 2 diabetes mellitus without complications: Secondary | ICD-10-CM | POA: Diagnosis not present

## 2015-06-29 DIAGNOSIS — Z792 Long term (current) use of antibiotics: Secondary | ICD-10-CM | POA: Diagnosis not present

## 2015-06-29 DIAGNOSIS — I10 Essential (primary) hypertension: Secondary | ICD-10-CM | POA: Insufficient documentation

## 2015-06-29 DIAGNOSIS — Z794 Long term (current) use of insulin: Secondary | ICD-10-CM | POA: Diagnosis not present

## 2015-06-29 DIAGNOSIS — Z7951 Long term (current) use of inhaled steroids: Secondary | ICD-10-CM | POA: Insufficient documentation

## 2015-06-29 DIAGNOSIS — Z79899 Other long term (current) drug therapy: Secondary | ICD-10-CM | POA: Insufficient documentation

## 2015-06-29 DIAGNOSIS — L02212 Cutaneous abscess of back [any part, except buttock]: Secondary | ICD-10-CM | POA: Diagnosis present

## 2015-06-29 DIAGNOSIS — L02222 Furuncle of back [any part, except buttock]: Secondary | ICD-10-CM | POA: Insufficient documentation

## 2015-06-29 MED ORDER — TRAMADOL HCL 50 MG PO TABS: 50.0000 mg | ORAL_TABLET | Freq: Two times a day (BID) | ORAL | Status: DC

## 2015-06-29 NOTE — ED Notes (Signed)
States she was seen yesterday with abscess area to lower back  States it was lanced yesterday but no packing noted on arrival. Large red area noted to left lower back .  Having increased pain today

## 2015-06-29 NOTE — ED Notes (Signed)
C/o boil to back. NAD

## 2015-06-29 NOTE — Discharge Instructions (Signed)
Abscess An abscess is an infected area that contains a collection of pus and debris.It can occur in almost any part of the body. An abscess is also known as a furuncle or boil. CAUSES  An abscess occurs when tissue gets infected. This can occur from blockage of oil or sweat glands, infection of hair follicles, or a minor injury to the skin. As the body tries to fight the infection, pus collects in the area and creates pressure under the skin. This pressure causes pain. People with weakened immune systems have difficulty fighting infections and get certain abscesses more often.  SYMPTOMS Usually an abscess develops on the skin and becomes a painful mass that is red, warm, and tender. If the abscess forms under the skin, you may feel a moveable soft area under the skin. Some abscesses break open (rupture) on their own, but most will continue to get worse without care. The infection can spread deeper into the body and eventually into the bloodstream, causing you to feel ill.  DIAGNOSIS  Your caregiver will take your medical history and perform a physical exam. A sample of fluid may also be taken from the abscess to determine what is causing your infection. TREATMENT  Your caregiver may prescribe antibiotic medicines to fight the infection. However, taking antibiotics alone usually does not cure an abscess. Your caregiver may need to make a small cut (incision) in the abscess to drain the pus. In some cases, gauze is packed into the abscess to reduce pain and to continue draining the area. HOME CARE INSTRUCTIONS   Only take over-the-counter or prescription medicines for pain, discomfort, or fever as directed by your caregiver.  If you were prescribed antibiotics, take them as directed. Finish them even if you start to feel better.  If gauze is used, follow your caregiver's directions for changing the gauze.  To avoid spreading the infection:  Keep your draining abscess covered with a  bandage.  Wash your hands well.  Do not share personal care items, towels, or whirlpools with others.  Avoid skin contact with others.  Keep your skin and clothes clean around the abscess.  Keep all follow-up appointments as directed by your caregiver. SEEK MEDICAL CARE IF:   You have increased pain, swelling, redness, fluid drainage, or bleeding.  You have muscle aches, chills, or a general ill feeling.  You have a fever. MAKE SURE YOU:   Understand these instructions.  Will watch your condition.  Will get help right away if you are not doing well or get worse.   This information is not intended to replace advice given to you by your health care provider. Make sure you discuss any questions you have with your health care provider.   Document Released: 01/26/2005 Document Revised: 10/18/2011 Document Reviewed: 07/01/2011 Elsevier Interactive Patient Education 2016 [employer].   Continue to dose the antibiotic as prescribed. Apply warm compresses to promote healing. Follow-up with the general surgeon as scheduled, on Thursday.

## 2015-06-29 NOTE — ED Provider Notes (Signed)
[facility] Emergency Department Provider Note ____________________________________________  Time seen: 1750  I have reviewed the triage vital signs and the nursing notes.  HISTORY  Chief Complaint  Abscess  HPI Vickie Montoya is a 40 y.o. female presents to the ED one day status post local incision and drainage for a abscess to the lower back. Patient was seen and evaluated [place] urgent care yesterday following a three-day onset of a abscess to the lower back.She claims that she was placed on Bactrim and wound culture was collected. She reports today because she was not given any pain medicine and notes some increased discomfort to the area. She rates the pain at 8/10 in triage.  Past Medical History  Diagnosis Date  . Diabetes mellitus without complication (HCC)   . Hypertension     There are no active problems to display for this patient.   Past Surgical History  Procedure Laterality Date  . Cesarean section    . Tubiligation      Current Outpatient Rx  Name  Route  Sig  Dispense  Refill  . albuterol (PROVENTIL HFA;VENTOLIN HFA) 108 (90 Base) MCG/ACT inhaler   Inhalation   Inhale 1-2 puffs into the lungs every 6 (six) hours as needed for wheezing or shortness of breath.   1 Inhaler   0   . fluticasone (FLONASE) 50 MCG/ACT nasal spray   Each Nare   Place 1 spray into both nostrils 2 (two) times daily.   16 g   0   . insulin glargine (LANTUS) 100 UNIT/ML injection   Subcutaneous   Inject into the skin at bedtime.         . insulin lispro (HUMALOG) 100 UNIT/ML injection   Subcutaneous   Inject into the skin 3 (three) times daily before meals.         Marland Kitchen EXPIRED: sodium chloride (OCEAN) 0.65 % SOLN nasal spray   Each Nare   Place 2 sprays into both nostrils every 2 (two) hours while awake.      0   . sulfamethoxazole-trimethoprim (BACTRIM DS,SEPTRA DS) 800-160 MG tablet   Oral   Take 1 tablet by mouth 2 (two) times daily.   20  tablet   0   . traMADol (ULTRAM) 50 MG tablet   Oral   Take 1 tablet (50 mg total) by mouth 2 (two) times daily.   10 tablet   0     Allergies Review of patient's allergies indicates no known allergies.  History reviewed. No pertinent family history.  Social History Social History  Substance Use Topics  . Smoking status: Never Smoker   . Smokeless tobacco: Never Used  . Alcohol Use: No   Review of Systems  Constitutional: Negative for fever. Eyes: Negative for visual changes. ENT: Negative for sore throat. Cardiovascular: Negative for chest pain. Respiratory: Negative for shortness of breath. Gastrointestinal: Negative for abdominal pain, vomiting and diarrhea. Genitourinary: Negative for dysuria. Musculoskeletal: Negative for back pain. Skin: Negative for rash. Abscess/cellulitis as above Neurological: Negative for headaches, focal weakness or numbness. ____________________________________________  PHYSICAL EXAM:  VITAL SIGNS: ED Triage Vitals  Enc Vitals Group     BP 06/29/15 1703 157/92 mmHg     Pulse Rate 06/29/15 1703 87     Resp 06/29/15 1703 20     Temp 06/29/15 1703 98.3 F (36.8 C)     Temp Source 06/29/15 1703 Oral     SpO2 06/29/15 1703 98 %  Weight 06/29/15 1703 234 lb (106.142 kg)     Height 06/29/15 1703  (1.702 m)     Head Cir --      Peak Flow --      Pain Score 06/29/15 1704 8     Pain Loc --      Pain Edu? --      Excl. in GC? --    Constitutional: Alert and oriented. Well appearing and in no distress. Head: Normocephalic and atraumatic. Eyes: Conjunctivae are normal. PERRL. Normal extraocular movements Hematological/Lymphatic/Immunological: No cervical lymphadenopathy. Cardiovascular: Normal rate, regular rhythm.  Respiratory: Normal respiratory effort.  Musculoskeletal: Nontender with normal range of motion in all extremities.  Neurologic:  Normal gait without ataxia. Normal speech and language. No gross focal neurologic  deficits are appreciated. Skin:  Skin is warm, dry and intact. No rash noted. Lower back with a firm, erythematous, deep area of induration, without appreciable fluctuance, pointing, or drainage. Area of firmness measures about 3 x 6 cm Psychiatric: Mood and affect are normal. Patient exhibits appropriate insight and judgment. ____________________________________________  INITIAL IMPRESSION / ASSESSMENT AND PLAN / ED COURSE  Patient with a firm, indurated,local cellulitic lesion to the lower back status post I&D. There is no fluctuance to suggest a central abscess at this time. Patient is on the Bactrim and review of the preliminary culture result reveals staph aureus infection. Given  her presentation, the final result will likely reveal an MRSA as the primary etiology. Bactrim is the appropriate antibiotic in this presentation. Patient is encouraged to apply warm compresses to the area to promote healing. She will follow-up with the general surgeon on Thursday as scheduled. Prescription for Ultram #10 is provided for interim pain relief. A work note suggesting equal and intermittent since that is also provided at the patient's request. ____________________________________________  FINAL CLINICAL IMPRESSION(S) / ED DIAGNOSES  Final diagnoses:  Boil, back      Lissa Hoard, PA-C 06/29/15 1848  Gayla Doss, MD 06/29/15 [id number]

## 2015-07-01 LAB — CULTURE, ROUTINE-ABSCESS: Special Requests: NORMAL

## 2015-07-02 ENCOUNTER — Ambulatory Visit (INDEPENDENT_AMBULATORY_CARE_PROVIDER_SITE_OTHER): Payer: BLUE CROSS/BLUE SHIELD | Admitting: General Surgery

## 2015-07-02 ENCOUNTER — Encounter: Payer: Self-pay | Admitting: General Surgery

## 2015-07-02 VITALS — BP 137/90 | HR 90 | Temp 98.7°F | Ht 67.0 in | Wt 220.0 lb

## 2015-07-02 DIAGNOSIS — L02212 Cutaneous abscess of back [any part, except buttock]: Secondary | ICD-10-CM | POA: Insufficient documentation

## 2015-07-02 NOTE — Patient Instructions (Signed)
Please see follow-up appointments below.  This area will continue to drain, keep a dressing over this area except when showering. When you shower, remove the dressing, take your shower like normal being careful not to pull packing but clean around area with soap and water only. Then reapply a new dressing.   If the packing falls out over the weekend, don't get scared we will replaced this on Monday.  If you develop increased redness, fever >100.5, or Nausea and vomiting; call our office immediately or go to the Emergency Room if we are closed.

## 2015-07-02 NOTE — Progress Notes (Signed)
Patient ID: Vickie Montoya, female   DOB: 14-May-1975, 40 y.o.   MRN: [id number]  CC: Back Abscess   HPI Vickie Montoya is a 40 y.o. female  Presents clinic for evaluation of a left lower back abscess. Patient states been there for approximately one week. She's been seen by urgent care for this we will culture the abscess and placed on antibiotics. She states that the area spontaneously started draining 2 days ago and does feel somewhat better but continues to cause her discomfort. She denies any fevers, chills, nausea, vomiting, diarrhea, constipation. She's had numerous abscesses in the past forward never 1 this bad.  HPI  Past Medical History  Diagnosis Date  . Diabetes mellitus without complication (HCC)   . Hypertension     Past Surgical History  Procedure Laterality Date  . Cesarean section    . Tubiligation    . Tubal ligation      Family History  Problem Relation Age of Onset  . Family history unknown: Yes    Social History Social History  Substance Use Topics  . Smoking status: Never Smoker   . Smokeless tobacco: Never Used  . Alcohol Use: No    No Known Allergies  Current Outpatient Prescriptions  Medication Sig Dispense Refill  . albuterol (PROVENTIL HFA;VENTOLIN HFA) 108 (90 Base) MCG/ACT inhaler Inhale 1-2 puffs into the lungs every 6 (six) hours as needed for wheezing or shortness of breath. 1 Inhaler 0  . fluticasone (FLONASE) 50 MCG/ACT nasal spray Place 1 spray into both nostrils 2 (two) times daily. 16 g 0  . insulin glargine (LANTUS) 100 UNIT/ML injection Inject 60 Units into the skin at bedtime.     . insulin lispro (HUMALOG) 100 UNIT/ML injection Inject into the skin 3 (three) times daily before meals. Sliding scale    . metFORMIN (GLUCOPHAGE) 500 MG tablet Take 500 mg by mouth 1 day or 1 dose.    . sulfamethoxazole-trimethoprim (BACTRIM DS,SEPTRA DS) 800-160 MG tablet Take 1 tablet by mouth 2 (two) times daily. 20 tablet 0  . traMADol (ULTRAM) 50 MG  tablet Take 1 tablet (50 mg total) by mouth 2 (two) times daily. 10 tablet 0   No current facility-administered medications for this visit.     Review of Systems A multi-point review of systems was asked and was negative except for the findings documented in the history of present illness  Physical Exam Blood pressure 137/90, pulse 90, temperature 98.7 F (37.1 C), temperature source Oral, height  (1.702 m), weight 99.791 kg (220 lb), last menstrual period 06/03/2015. CONSTITUTIONAL:  No acute distress. EYES: Pupils are equal, round, and reactive to light, Sclera are non-icteric. EARS, NOSE, MOUTH AND THROAT: The oropharynx is clear. The oral mucosa is pink and moist. Hearing is intact to voice. LYMPH NODES:  Lymph nodes in the neck are normal. RESPIRATORY:  Lungs are clear. There is normal respiratory effort, with equal breath sounds bilaterally, and without pathologic use of accessory muscles. CARDIOVASCULAR: Heart is regular without murmurs, gallops, or rubs. GI: The abdomen is  Large, soft, nontender, and nondistended. There are no palpable masses. There is no hepatosplenomegaly. There are normal bowel sounds in all quadrants. GU: Rectal deferred.   MUSCULOSKELETAL: Normal muscle strength and tone. No cyanosis or edema.   SKIN:  Left lower back abscess visualized. The abscess itself measured approximately 4 x 5 cm and was tensely other was draining areas. Very tender to palpation with blanching erythema. NEUROLOGIC: Motor and sensation  is grossly normal. Cranial nerves are grossly intact. PSYCH:  Oriented to person, place and time. Affect is normal.  Data Reviewed  culture from the prior abscess drainage growing Bactrim sensitive staph aureus I have personally reviewed the patient's imaging, laboratory findings and medical records.    Assessment     left lower back abscess     Plan     40 year old female with a left lower back abscess. Given the presentation of this today  discussed that incision and drainage was indicated. Patient voiced understanding and agreed. Informed consent was obtained prior to drainage.   The area of the abscess was cleaned with Betadine and sterilely draped. Using sterile technique the area was localized using a 50-50 mixture of lidocaine and new tube. Once the area was localized a cruciate incision was made with 11 blade scalpel and copious amount of purulent fluid was able to be expressed. Once no further purulent fluid was express the area was then irrigated with normal saline and then packed with quarter-inch iodoform gauze. A sterile dressing was placed over this. Patient will return to clinic tomorrow for a wound check and partial removal of her packing. Attention return to clinic on Monday morning for additional wound check and for packing removal.   Counseled patient to continue her Bactrim as well as discussed the signs and symptoms of worsening infection and report to urgent care where the emergency room over the weekend should they occur.      Time spent with the patient was 60 minutes, with more than 50% of the time spent in face-to-face education, counseling and care coordination.     Ricarda Frame, MD FACS General Surgeon 07/02/2015, 3:22 PM

## 2015-07-03 ENCOUNTER — Ambulatory Visit: Payer: BLUE CROSS/BLUE SHIELD

## 2015-07-03 VITALS — BP 167/84 | HR 80 | Temp 98.1°F | Ht 67.0 in | Wt 222.4 lb

## 2015-07-03 DIAGNOSIS — Z5189 Encounter for other specified aftercare: Secondary | ICD-10-CM

## 2015-07-06 ENCOUNTER — Ambulatory Visit (INDEPENDENT_AMBULATORY_CARE_PROVIDER_SITE_OTHER): Payer: BLUE CROSS/BLUE SHIELD | Admitting: General Surgery

## 2015-07-06 ENCOUNTER — Encounter: Payer: Self-pay | Admitting: General Surgery

## 2015-07-06 VITALS — BP 161/110 | HR 87 | Temp 98.4°F | Wt 222.0 lb

## 2015-07-06 DIAGNOSIS — L02212 Cutaneous abscess of back [any part, except buttock]: Secondary | ICD-10-CM

## 2015-07-06 MED ORDER — SULFAMETHOXAZOLE-TRIMETHOPRIM 800-160 MG PO TABS: 1.0000 | ORAL_TABLET | Freq: Two times a day (BID) | ORAL | Status: DC

## 2015-07-06 NOTE — Patient Instructions (Addendum)
Please change your dressing daily and as needed. We will see you next week.  Please remember to take four more days of antibiotic which was sent to your pharmacy.

## 2015-07-06 NOTE — Progress Notes (Signed)
Outpatient Surgical Follow Up  07/06/2015  Vickie Montoya is an 40 y.o. female.   Chief Complaint  Patient presents with  . Follow-up    I&D    HPI: 40 year old female returns to clinic for follow-up of recent left lower back abscess I&D. Patient states the area feels much better than it did before the I&D. She did have a subjective fever last night however it is broken. Otherwise she denies any other complaints. She denies any chills, chest pain, shortness of breath, abdominal pain, diarrhea, constipation. She states the packing fell out over the weekend was not replaced. Continues to have some drainage every day.  Past Medical History  Diagnosis Date  . Diabetes mellitus without complication (HCC)   . Hypertension     Past Surgical History  Procedure Laterality Date  . Cesarean section    . Tubiligation    . Tubal ligation      Family History  Problem Relation Age of Onset  . Hyperlipidemia Mother     Social History:  reports that she has never smoked. She has never used smokeless tobacco. She reports that she uses illicit drugs. She reports that she does not drink alcohol.  Allergies: No Known Allergies  Medications reviewed.    ROS A multipoint review of systems was completed, all pertinent positives and negatives are documented in the history of present illness remainder negative.   BP 161/110 mmHg  Pulse 87  Temp(Src) 98.4 F (36.9 C) (Oral)  Wt 100.699 kg (222 lb)  LMP 06/03/2015  Physical Exam  Gen.: No acute distress Chest: Clear to sedation Heart: Regular rhythm Abdomen soft and nontender Skin: Previous I&D site evaluated now with only. I&D hyperemia. No spreading erythema and drainage is serosanguineous. Skin opening measures 5 mm and hyperemia measures 3 mm in all directions around the skin opening.   No results found for this or any previous visit (from the past 48 hour(s)). No results found.  Assessment/Plan:  1. Abscess of lower  back 40 year old female 4 days status post I&D of left lower back abscess. Much improved. Discussed with patient need to extend antibiotics to a total of a 14 day course. She is to continue daily wound care until drainage stops. Discussed leading soap and water run over the area and then bandaging after showers daily basis. Patient voiced understanding we'll follow up in clinic in 1 week for additional wound check.     Ricarda Frameharles Cashius Grandstaff, MD FACS General Surgeon  07/06/2015,8:28 AM

## 2015-07-06 NOTE — Progress Notes (Signed)
Patient seen in office. Redness is much better than Thursday when this procedure was done. Approximately 4 inches of packing removed from incision. Incision has continued to drain nicely. Asked patient to please change this dressing twice daily and when visibly soiled as dressing was soaked upon her arrival. She verbalizes understanding of this. Talked with patient at great length about the importance of hygiene and step by step how I would like her to shower while she has this and after this area has healed in order to not develop another abscess.  She will return to clinic on 3/6 and see Dr. Tonita CongWoodham.  Encouraged to call with any questions or concerns prior to this appointment.

## 2015-07-10 ENCOUNTER — Ambulatory Visit: Payer: Self-pay | Admitting: Surgery

## 2015-07-13 ENCOUNTER — Encounter: Payer: Self-pay | Admitting: Surgery

## 2015-07-13 ENCOUNTER — Ambulatory Visit (INDEPENDENT_AMBULATORY_CARE_PROVIDER_SITE_OTHER): Payer: BLUE CROSS/BLUE SHIELD | Admitting: Surgery

## 2015-07-13 VITALS — BP 156/98 | HR 90 | Temp 98.3°F | Wt 224.0 lb

## 2015-07-13 DIAGNOSIS — L0390 Cellulitis, unspecified: Secondary | ICD-10-CM | POA: Diagnosis not present

## 2015-07-13 DIAGNOSIS — L0291 Cutaneous abscess, unspecified: Secondary | ICD-10-CM

## 2015-07-13 MED ORDER — SULFAMETHOXAZOLE-TRIMETHOPRIM 800-160 MG PO TABS: 1.0000 | ORAL_TABLET | Freq: Two times a day (BID) | ORAL | Status: DC

## 2015-07-13 NOTE — Progress Notes (Signed)
40 year old female with morbid obesity and insulin-dependent diabetes status post an I&D of skin abscesses by Dr. Tonita CongWoodham She is doing well and no fevers no chills  PE: NAD  Are still some induration in the lower back erythema. No evidence of undrained abscesses no evidence of necrotizing infection  A/P assistant cellulitis in the back will give her an round of antibiotics 2 weeks with Bactrim. Previous culture showed MSSA and but given her risk factors and will cover her with Bactrim. She'll have a return appointment with Dr. Tonita CongWoodham in 2 weeks

## 2015-07-22 ENCOUNTER — Ambulatory Visit (INDEPENDENT_AMBULATORY_CARE_PROVIDER_SITE_OTHER): Payer: BLUE CROSS/BLUE SHIELD | Admitting: General Surgery

## 2015-07-22 ENCOUNTER — Encounter: Payer: Self-pay | Admitting: General Surgery

## 2015-07-22 VITALS — BP 169/108 | HR 91 | Temp 98.3°F | Ht 67.0 in | Wt 225.8 lb

## 2015-07-22 DIAGNOSIS — Z5189 Encounter for other specified aftercare: Secondary | ICD-10-CM

## 2015-07-22 NOTE — Progress Notes (Signed)
Outpatient Surgical Follow Up  07/22/2015  Vickie Montoya is an 40 y.o. female.   Chief Complaint  Patient presents with  . Routine Post Op    I&D of Left Lower Back    HPI: Patient returns for follow-up from an I&D of a left lower back abscess. She reports feeling better. She denies any fevers, chills, nausea, vomiting, diarrhea, constipation. The area has had minimal to no drainage from the last several days and is no longer painful.  Past Medical History  Diagnosis Date  . Diabetes mellitus without complication (HCC)   . Hypertension     Past Surgical History  Procedure Laterality Date  . Cesarean section    . Tubiligation    . Tubal ligation      Family History  Problem Relation Age of Onset  . Hyperlipidemia Mother     Social History:  reports that she has never smoked. She has never used smokeless tobacco. She reports that she uses illicit drugs. She reports that she does not drink alcohol.  Allergies: No Known Allergies  Medications reviewed.    ROS Multipoint review of systems was completed, all pertinent positives and negatives are documented within the history of present illness and remainder are negative.   BP 169/108 mmHg  Pulse 91  Temp(Src) 98.3 F (36.8 C) (Oral)  Ht 5\' 7"  (1.702 m)  Wt 102.422 kg (225 lb 12.8 oz)  BMI 35.36 kg/m2  LMP 07/01/2015  Physical Exam Gen.: No acute distress Chest: Clear to auscultation Heart: Regular rate and rhythm Abdomen: Soft, nontender Skin: Left lower quadrant back abscess drainage site examined. No spreading erythema or purulent drainage. There is a moist scab over the area of I&D without evidence of purulence.    No results found for this or any previous visit (from the past 48 hour(s)). No results found.  Assessment/Plan:  1. Wound check, abscess 40 year old female now 3 weeks status post I&D of left lower back abscess. Doing well. Instructed her to complete the antibiotics she's been prescribed.  She'll follow up on an as-needed basis at the area does not continue to improve.     Ricarda Frameharles [name], MD FACS General Surgeon  07/22/2015,10:58 AM

## 2015-07-22 NOTE — Patient Instructions (Addendum)
Your blood pressure has been extremely high the last 2 visits, you will need to get established with a physician. [place] [facility], there number is [phone]. I am not sure if [facility] [place] is taking new patients at this time, but their number is [phone].

## 2015-07-23 ENCOUNTER — Encounter: Payer: Self-pay | Admitting: General Surgery

## 2015-07-23 ENCOUNTER — Telehealth: Payer: Self-pay

## 2015-07-23 NOTE — Telephone Encounter (Signed)
Patient's FMLA Forms from [employer] were filled out and faxed.

## 2015-09-23 ENCOUNTER — Other Ambulatory Visit: Payer: Self-pay

## 2015-09-23 ENCOUNTER — Encounter: Payer: Self-pay | Admitting: Emergency Medicine

## 2015-09-23 ENCOUNTER — Emergency Department
Admission: EM | Admit: 2015-09-23 | Discharge: 2015-09-23 | Disposition: A | Discharge status: Home / Self Care | Payer: BLUE CROSS/BLUE SHIELD | Attending: Emergency Medicine | Admitting: Emergency Medicine

## 2015-09-23 DIAGNOSIS — R739 Hyperglycemia, unspecified: Secondary | ICD-10-CM

## 2015-09-23 DIAGNOSIS — I10 Essential (primary) hypertension: Secondary | ICD-10-CM | POA: Insufficient documentation

## 2015-09-23 DIAGNOSIS — Z7984 Long term (current) use of oral hypoglycemic drugs: Secondary | ICD-10-CM | POA: Diagnosis not present

## 2015-09-23 DIAGNOSIS — E1165 Type 2 diabetes mellitus with hyperglycemia: Secondary | ICD-10-CM | POA: Insufficient documentation

## 2015-09-23 DIAGNOSIS — R42 Dizziness and giddiness: Secondary | ICD-10-CM | POA: Diagnosis present

## 2015-09-23 DIAGNOSIS — Z794 Long term (current) use of insulin: Secondary | ICD-10-CM | POA: Insufficient documentation

## 2015-09-23 DIAGNOSIS — R110 Nausea: Secondary | ICD-10-CM

## 2015-09-23 DIAGNOSIS — Z79899 Other long term (current) drug therapy: Secondary | ICD-10-CM | POA: Diagnosis not present

## 2015-09-23 LAB — BASIC METABOLIC PANEL
ANION GAP: 7 (ref 5–15)
BUN: 15 mg/dL (ref 6–20)
CO2: 27 mmol/L (ref 22–32)
Calcium: 9.3 mg/dL (ref 8.9–10.3)
Chloride: 98 mmol/L — ABNORMAL LOW (ref 101–111)
Creatinine, Ser: 0.89 mg/dL (ref 0.44–1.00)
GFR calc Af Amer: >60 mL/min (ref >60)
GLUCOSE: 524 mg/dL — AB (ref 65–99)
POTASSIUM: 4.4 mmol/L (ref 3.5–5.1)
Sodium: 132 mmol/L — ABNORMAL LOW (ref 135–145)

## 2015-09-23 LAB — CBC
HEMATOCRIT: 38.7 % (ref 35.0–47.0)
HEMOGLOBIN: 12.7 g/dL (ref 12.0–16.0)
MCH: 28.6 pg (ref 26.0–34.0)
MCHC: 32.8 g/dL (ref 32.0–36.0)
MCV: 87.2 fL (ref 80.0–100.0)
Platelets: 221 10*3/uL (ref 150–440)
RBC: 4.44 MIL/uL (ref 3.80–5.20)
RDW: 14.2 % (ref 11.5–14.5)
WBC: 7 10*3/uL (ref 3.6–11.0)

## 2015-09-23 LAB — URINALYSIS COMPLETE WITH MICROSCOPIC (ARMC ONLY)
BACTERIA UA: NONE SEEN
Bilirubin Urine: NEGATIVE
KETONES UR: NEGATIVE mg/dL
LEUKOCYTES UA: NEGATIVE
NITRITE: NEGATIVE
Protein, ur: 100 mg/dL — AB
SPECIFIC GRAVITY, URINE: 1.023 (ref 1.005–1.030)
Squamous Epithelial / LPF: NONE SEEN
pH: 5 (ref 5.0–8.0)

## 2015-09-23 LAB — POCT PREGNANCY, URINE: Preg Test, Ur: NEGATIVE

## 2015-09-23 LAB — GLUCOSE, CAPILLARY
GLUCOSE-CAPILLARY: 475 mg/dL — AB (ref 65–99)
GLUCOSE-CAPILLARY: 470 mg/dL — AB (ref 65–99)
GLUCOSE-CAPILLARY: 299 mg/dL — AB (ref 65–99)
GLUCOSE-CAPILLARY: 267 mg/dL — AB (ref 65–99)
Glucose-Capillary: 243 mg/dL — ABNORMAL HIGH (ref 65–99)

## 2015-09-23 MED ORDER — INSULIN ASPART 100 UNIT/ML ~~LOC~~ SOLN
8.0000 [IU] | Freq: Once | SUBCUTANEOUS | Status: AC
  Administered 2015-09-23: 8 [IU] via INTRAVENOUS
  Filled 2015-09-23: qty 8

## 2015-09-23 MED ORDER — SODIUM CHLORIDE 0.9 % IV BOLUS (SEPSIS)
1000.0000 mL | Freq: Once | INTRAVENOUS | Status: AC
  Administered 2015-09-23: 1000 mL via INTRAVENOUS

## 2015-09-23 MED ORDER — ONDANSETRON HCL 4 MG/2ML IJ SOLN
4.0000 mg | Freq: Once | INTRAMUSCULAR | Status: AC
  Administered 2015-09-23: 4 mg via INTRAVENOUS
  Filled 2015-09-23: qty 2

## 2015-09-23 MED ORDER — ONDANSETRON 4 MG PO TBDP: 4.0000 mg | ORAL_TABLET | Freq: Three times a day (TID) | ORAL | Status: DC | PRN

## 2015-09-23 NOTE — Discharge Instructions (Signed)
Hyperglycemia °Hyperglycemia occurs when the glucose (sugar) in your blood is too high. Hyperglycemia can happen for many reasons, but it most often happens to people who do not know they have diabetes or are not managing their diabetes properly.  °CAUSES  °Whether you have diabetes or not, there are other causes of hyperglycemia. Hyperglycemia can occur when you have diabetes, but it can also occur in other situations that you might not be as aware of, such as: °Diabetes °· If you have diabetes and are having problems controlling your blood glucose, hyperglycemia could occur because of some of the following reasons: °· Not following your meal plan. °· Not taking your diabetes medications or not taking it properly. °· Exercising less or doing less activity than you normally do. °· Being sick. °Pre-diabetes °· This cannot be ignored. Before people develop Type 2 diabetes, they almost always have "pre-diabetes." This is when your blood glucose levels are higher than normal, but not yet high enough to be diagnosed as diabetes. Research has shown that some long-term damage to the body, especially the heart and circulatory system, may already be occurring during pre-diabetes. If you take action to manage your blood glucose when you have pre-diabetes, you may delay or prevent Type 2 diabetes from developing. °Stress °· If you have diabetes, you may be "diet" controlled or on oral medications or insulin to control your diabetes. However, you may find that your blood glucose is higher than usual in the hospital whether you have diabetes or not. This is often referred to as "stress hyperglycemia." Stress can elevate your blood glucose. This happens because of hormones put out by the body during times of stress. If stress has been the cause of your high blood glucose, it can be followed regularly by your caregiver. That way he/she can make sure your hyperglycemia does not continue to get worse or progress to  diabetes. °Steroids °· Steroids are medications that act on the infection fighting system (immune system) to block inflammation or infection. One side effect can be a rise in blood glucose. Most people can produce enough extra insulin to allow for this rise, but for those who cannot, steroids make blood glucose levels go even higher. It is not unusual for steroid treatments to "uncover" diabetes that is developing. It is not always possible to determine if the hyperglycemia will go away after the steroids are stopped. A special blood test called an A1c is sometimes done to determine if your blood glucose was elevated before the steroids were started. °SYMPTOMS °· Thirsty. °· Frequent urination. °· Dry mouth. °· Blurred vision. °· Tired or fatigue. °· Weakness. °· Sleepy. °· Tingling in feet or leg. °DIAGNOSIS  °Diagnosis is made by monitoring blood glucose in one or all of the following ways: °· A1c test. This is a chemical found in your blood. °· Fingerstick blood glucose monitoring. °· Laboratory results. °TREATMENT  °First, knowing the cause of the hyperglycemia is important before the hyperglycemia can be treated. Treatment may include, but is not be limited to: °· Education. °· Change or adjustment in medications. °· Change or adjustment in meal plan. °· Treatment for an illness, infection, etc. °· More frequent blood glucose monitoring. °· Change in exercise plan. °· Decreasing or stopping steroids. °· Lifestyle changes. °HOME CARE INSTRUCTIONS  °· Test your blood glucose as directed. °· Exercise regularly. Your caregiver will give you instructions about exercise. Pre-diabetes or diabetes which comes on with stress is helped by exercising. °· Eat wholesome,   balanced meals. Eat often and at regular, fixed times. Your caregiver or nutritionist will give you a meal plan to guide your sugar intake. °· Being at an ideal weight is important. If needed, losing as little as 10 to 15 pounds may help improve blood  glucose levels. °SEEK MEDICAL CARE IF:  °· You have questions about medicine, activity, or diet. °· You continue to have symptoms (problems such as increased thirst, urination, or weight gain). °SEEK IMMEDIATE MEDICAL CARE IF:  °· You are vomiting or have diarrhea. °· Your breath smells fruity. °· You are breathing faster or slower. °· You are very sleepy or incoherent. °· You have numbness, tingling, or pain in your feet or hands. °· You have chest pain. °· Your symptoms get worse even though you have been following your caregiver's orders. °· If you have any other questions or concerns. °  °This information is not intended to replace advice given to you by your health care provider. Make sure you discuss any questions you have with your health care provider. °  °Document Released: 10/12/2000 Document Revised: 07/11/2011 Document Reviewed: 12/23/2014 °Elsevier Interactive Patient Education ©2016 [employer] ° °Nausea, Adult °Nausea means you feel sick to your stomach or need to throw up (vomit). It may be a sign of a more serious problem. If nausea gets worse, you may throw up. If you throw up a lot, you may lose too much body fluid (dehydration). °HOME CARE  °· Get plenty of rest. °· Ask your doctor how to replace body fluid losses (rehydrate). °· Eat small amounts of food. Sip liquids more often. °· Take all medicines as told by your doctor. °GET HELP RIGHT AWAY IF: °· You have a fever. °· You pass out (faint). °· You keep throwing up or have blood in your throw up. °· You are very weak, have dry lips or a dry mouth, or you are very thirsty (dehydrated). °· You have dark or bloody poop (stool). °· You have very bad chest or belly (abdominal) pain. °· You do not get better after 2 days, or you get worse. °· You have a headache. °MAKE SURE YOU: °· Understand these instructions. °· Will watch your condition. °· Will get help right away if you are not doing well or get worse. °  °This information is not intended to  replace advice given to you by your health care provider. Make sure you discuss any questions you have with your health care provider. °  °Document Released: 04/07/2011 Document Revised: 07/11/2011 Document Reviewed: 04/07/2011 °Elsevier Interactive Patient Education ©2016 [employer] ° °

## 2015-09-23 NOTE — ED Provider Notes (Signed)
[facility] Emergency Department Provider Note  Time seen: 8:49 PM  I have reviewed the triage vital signs and the nursing notes.   HISTORY  Chief Complaint Dizziness and Nausea    HPI Vickie Montoya is a 40 y.o. female with a past medical history of diabetes, hypertension, presents the emergency department with dizziness, lightheadedness occasional nausea and elevated blood glucose. According to the patient for the past 3 or 4 days her blood sugars have been running in the 4-500 range. She has been intermittently nauseated but denies any vomiting. She does state an episode of diarrhea 2 days ago but denies any currently. States she has occasionally felt lightheaded over the past several days. Denies any headache, focal weakness or numbness, upper respiratory symptoms, dysuria, or fever. Describes her nausea is mild currently.     Past Medical History  Diagnosis Date  . Diabetes mellitus without complication (HCC)   . Hypertension     Patient Active Problem List   Diagnosis Date Noted  . Cellulitis and abscess 07/13/2015  . Abscess of lower back 07/02/2015    Past Surgical History  Procedure Laterality Date  . Cesarean section    . Tubiligation    . Tubal ligation      Current Outpatient Rx  Name  Route  Sig  Dispense  Refill  . albuterol (PROVENTIL HFA;VENTOLIN HFA) 108 (90 Base) MCG/ACT inhaler   Inhalation   Inhale 1-2 puffs into the lungs every 6 (six) hours as needed for wheezing or shortness of breath.   1 Inhaler   0   . fluticasone (FLONASE) 50 MCG/ACT nasal spray   Each Nare   Place 1 spray into both nostrils 2 (two) times daily.   16 g   0   . insulin glargine (LANTUS) 100 UNIT/ML injection   Subcutaneous   Inject 60 Units into the skin at bedtime.          . insulin lispro (HUMALOG) 100 UNIT/ML injection   Subcutaneous   Inject into the skin 3 (three) times daily before meals. Sliding scale         . metFORMIN  (GLUCOPHAGE) 500 MG tablet   Oral   Take 500 mg by mouth 1 day or 1 dose.         . sulfamethoxazole-trimethoprim (BACTRIM DS,SEPTRA DS) 800-160 MG tablet   Oral   Take 1 tablet by mouth 2 (two) times daily.   28 tablet   0   . traMADol (ULTRAM) 50 MG tablet   Oral   Take 1 tablet (50 mg total) by mouth 2 (two) times daily.   10 tablet   0     Allergies Review of patient's allergies indicates no known allergies.  Family History  Problem Relation Age of Onset  . Hyperlipidemia Mother     Social History Social History  Substance Use Topics  . Smoking status: Never Smoker   . Smokeless tobacco: Never Used  . Alcohol Use: No    Review of Systems Constitutional: Negative for fever. Cardiovascular: Negative for chest pain. Respiratory: Negative for shortness of breath. Gastrointestinal: Negative for abdominal pain Musculoskeletal: Negative for back pain. Neurological: Negative for headache 10-point ROS otherwise negative.  ____________________________________________   PHYSICAL EXAM:  VITAL SIGNS: ED Triage Vitals  Enc Vitals Group     BP 09/23/15 1813 173/102 mmHg     Pulse Rate 09/23/15 1813 83     Resp 09/23/15 1813 20     Temp  09/23/15 1813 98 F (36.7 C)     Temp Source 09/23/15 1813 Oral     SpO2 09/23/15 1813 100 %     Weight 09/23/15 1813 225 lb (102.059 kg)     Height 09/23/15 1813 5\' 7"  (1.702 m)     Head Cir --      Peak Flow --      Pain Score --      Pain Loc --      Pain Edu? --      Excl. in GC? --     Constitutional: Alert and oriented. Well appearing and in no distress. Eyes: Normal exam ENT   Head: Normocephalic and atraumatic.   Mouth/Throat: Dry mucous membranes. Cardiovascular: Normal rate, regular rhythm. No murmur Respiratory: Normal respiratory effort without tachypnea nor retractions. Breath sounds are clear  Gastrointestinal: Soft and nontender. No distention.   Musculoskeletal: Nontender with normal range of  motion in all extremities.  Neurologic:  Normal speech and language. No gross focal neurologic deficits Skin:  Skin is warm, dry and intact.  Psychiatric: Mood and affect are normal.   ____________________________________________    EKG  EKG reviewed and interpreted by myself shows normal sinus rhythm at 84 bpm, narrow QRS, normal axis, normal intervals, nonspecific but no concerning ST changes.  ____________________________________________   INITIAL IMPRESSION / ASSESSMENT AND PLAN / ED COURSE  Pertinent labs & imaging results that were available during my care of the patient were reviewed by me and considered in my medical decision making (see chart for details).  Patient presents the emergency department with nausea, elevated blood glucose and lightheadedness. Patient's blood glucose is 524 in the emergency department. Labs otherwise within normal limits including an anion gap of 7, urinalysis pending. I suspect the patient's lightheadedness is likely due to her hyperglycemia over the past few days. We will treat with IV insulin, doses Zofran, and 2 L of normal saline. I have added on a hepatic function panel as well as troponin. Patient denies any chest pain or abdominal pain.  Patient's labs are largely within normal limits besides her hyperglycemia. Urinalysis shows red blood cells, patient is currently on her menstrual cycle. Patient states she is feeling much better after fluids. Blood glucose is currently 299. We will give the remaining 500 cc of fluid to complete the 2 L bolus and discharge the patient home with primary care follow-up with Dr. Elease HashimotoMaloney. Patient is agreeable plan.  ____________________________________________   FINAL CLINICAL IMPRESSION(S) / ED DIAGNOSES  Hyperglycemia Nausea   Minna AntisKevin Malvern Kadlec, MD 09/23/15 2251

## 2015-09-23 NOTE — ED Notes (Signed)
Patient to ER for dizziness, lightheadedness, and nausea last few days. Denies any known fevers, but has had chills.

## 2015-11-26 ENCOUNTER — Emergency Department
Admission: EM | Admit: 2015-11-26 | Discharge: 2015-11-26 | Disposition: A | Discharge status: Home / Self Care | Payer: BLUE CROSS/BLUE SHIELD | Attending: Emergency Medicine | Admitting: Emergency Medicine

## 2015-11-26 ENCOUNTER — Encounter: Payer: Self-pay | Admitting: Emergency Medicine

## 2015-11-26 DIAGNOSIS — E1165 Type 2 diabetes mellitus with hyperglycemia: Secondary | ICD-10-CM | POA: Diagnosis not present

## 2015-11-26 DIAGNOSIS — Z794 Long term (current) use of insulin: Secondary | ICD-10-CM | POA: Diagnosis not present

## 2015-11-26 DIAGNOSIS — I10 Essential (primary) hypertension: Secondary | ICD-10-CM | POA: Diagnosis not present

## 2015-11-26 DIAGNOSIS — R739 Hyperglycemia, unspecified: Secondary | ICD-10-CM

## 2015-11-26 DIAGNOSIS — Z79899 Other long term (current) drug therapy: Secondary | ICD-10-CM | POA: Diagnosis not present

## 2015-11-26 DIAGNOSIS — F1290 Cannabis use, unspecified, uncomplicated: Secondary | ICD-10-CM | POA: Insufficient documentation

## 2015-11-26 DIAGNOSIS — R358 Other polyuria: Secondary | ICD-10-CM | POA: Diagnosis present

## 2015-11-26 HISTORY — DX: Depression, unspecified: F32.A

## 2015-11-26 HISTORY — DX: Major depressive disorder, single episode, unspecified: F32.9

## 2015-11-26 LAB — URINALYSIS COMPLETE WITH MICROSCOPIC (ARMC ONLY)
BILIRUBIN URINE: NEGATIVE
Ketones, ur: NEGATIVE mg/dL
LEUKOCYTES UA: NEGATIVE
NITRITE: NEGATIVE
PH: 5 (ref 5.0–8.0)
Protein, ur: 100 mg/dL — AB
Specific Gravity, Urine: 1.022 (ref 1.005–1.030)

## 2015-11-26 LAB — GLUCOSE, CAPILLARY: GLUCOSE-CAPILLARY: 330 mg/dL — AB (ref 65–99)

## 2015-11-26 LAB — CBC
HCT: 40.7 % (ref 35.0–47.0)
HEMOGLOBIN: 13.6 g/dL (ref 12.0–16.0)
MCH: 29.5 pg (ref 26.0–34.0)
MCHC: 33.3 g/dL (ref 32.0–36.0)
MCV: 88.4 fL (ref 80.0–100.0)
Platelets: 266 10*3/uL (ref 150–440)
RBC: 4.61 MIL/uL (ref 3.80–5.20)
RDW: 12.9 % (ref 11.5–14.5)
WBC: 7.7 10*3/uL (ref 3.6–11.0)

## 2015-11-26 LAB — BASIC METABOLIC PANEL
ANION GAP: 9 (ref 5–15)
BUN: 26 mg/dL — ABNORMAL HIGH (ref 6–20)
CALCIUM: 9.4 mg/dL (ref 8.9–10.3)
CO2: 23 mmol/L (ref 22–32)
Chloride: 103 mmol/L (ref 101–111)
Creatinine, Ser: 0.96 mg/dL (ref 0.44–1.00)
GLUCOSE: 343 mg/dL — AB (ref 65–99)
Potassium: 4.1 mmol/L (ref 3.5–5.1)
SODIUM: 135 mmol/L (ref 135–145)

## 2015-11-26 MED ORDER — SODIUM CHLORIDE 0.9 % IV BOLUS (SEPSIS)
1000.0000 mL | INTRAVENOUS | Status: AC
  Administered 2015-11-26: 1000 mL via INTRAVENOUS

## 2015-11-26 MED ORDER — METFORMIN HCL 500 MG PO TABS: 500.0000 mg | ORAL_TABLET | Freq: Two times a day (BID) | ORAL | 2 refills | Status: DC

## 2015-11-26 NOTE — ED Provider Notes (Signed)
[facility] Emergency Department Provider Note  ____________________________________________   First MD Initiated Contact with Patient 11/26/15 1940     (approximate)  I have reviewed the triage vital signs and the nursing notes.   HISTORY  Chief Complaint Hyperglycemia    HPI Vickie Montoya is a 40 y.o. female with uncontrolled diabetes who presents at the recommendation of her new primary care provider, Dr. Donna Bernard, for management of hyperglycemia.  The patient has known diabetes for years but has gone untreated.  Gradually she has lost her vision in the left eye and over the last approx 2 weeks has had pain and decreased vision in the right eye.  She saw an ophthalmologist in [place] who said her diabetes was causing her vision loss and that she needs to have a primary care doctor or endocrinologist manage her diabetes.  She set up an appointment with Dr. Rennis Harding at [facility] primary care in [place].  She had her appointment today and some lab work was performed showing an A1c of greater than 12 and a blood sugar greater than 400.  According to the patient, Dr. Rennis Harding told her that she should go to the emergency department and that we would handle whatever medication she needs and that she will have a follow-up appointment in 3 weeks with their clinic.  Other than her gradual onset vision loss which is severe, he has no symptoms.  Specifically she denies fever/chills, chest pain, shortness of breath, nausea, vomiting, abdominal pain, dysuria.  She does urinate multiple times a day and more than she used to but this has also been gradual in onset.   Past Medical History:  Diagnosis Date  . Depression   . Diabetes mellitus without complication (HCC)   . Hypertension     Patient Active Problem List   Diagnosis Date Noted  . Cellulitis and abscess 07/13/2015  . Abscess of lower back 07/02/2015    Past Surgical History:  Procedure Laterality Date  .  CESAREAN SECTION    . TUBAL LIGATION    . tubiligation      Prior to Admission medications   Medication Sig Start Date End Date Taking? Authorizing Provider  albuterol (PROVENTIL HFA;VENTOLIN HFA) 108 (90 Base) MCG/ACT inhaler Inhale 1-2 puffs into the lungs every 6 (six) hours as needed for wheezing or shortness of breath. 05/01/15  Yes Barbaraann Barthel, NP  insulin glargine (LANTUS) 100 UNIT/ML injection Inject 60 Units into the skin at bedtime.    Yes Historical Provider, MD  insulin lispro (HUMALOG) 100 UNIT/ML injection Inject into the skin 3 (three) times daily before meals. Sliding scale   Yes Historical Provider, MD  lisinopril (PRINIVIL,ZESTRIL) 10 MG tablet Take 10 mg by mouth daily.   Yes Historical Provider, MD  metFORMIN (GLUCOPHAGE) 500 MG tablet Take 1 tablet (500 mg total) by mouth 2 (two) times daily with a meal. 11/26/15   Loleta Rose, MD    Allergies Review of patient's allergies indicates no known allergies.  Family History  Problem Relation Age of Onset  . Hyperlipidemia Mother     Social History Social History  Substance Use Topics  . Smoking status: Never Smoker  . Smokeless tobacco: Never Used  . Alcohol use No    Review of Systems Constitutional: No fever/chills Eyes: Gradual onset vision loss over weeks ENT: No sore throat. Cardiovascular: Denies chest pain. Respiratory: Denies shortness of breath. Gastrointestinal: No abdominal pain.  No nausea, no vomiting.  No diarrhea.  No constipation. Genitourinary: Negative for dysuria. +Polyuria Musculoskeletal: Negative for back pain. Skin: Negative for rash. Neurological: Negative for headaches, focal weakness or numbness.  10-point ROS otherwise negative.  ____________________________________________   PHYSICAL EXAM:  VITAL SIGNS: ED Triage Vitals  Enc Vitals Group     BP 11/26/15 1906 (!) 179/108     Pulse Rate 11/26/15 1706 78     Resp 11/26/15 1706 18     Temp 11/26/15 1706 98.6 F (37  C)     Temp Source 11/26/15 1706 Oral     SpO2 11/26/15 1706 99 %     Weight 11/26/15 1706 220 lb (99.8 kg)     Height 11/26/15 1706  (1.702 m)     Head Circumference --      Peak Flow --      Pain Score 11/26/15 1707 1     Pain Loc --      Pain Edu? --      Excl. in GC? --     Constitutional: Alert and oriented. Well appearing and in no acute distress. Eyes: Her left eye is mostly closed at baseline but her Conjunctivae are normal. PERRL. EOMI. 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 Nose: No congestion/rhinnorhea. Mouth/Throat: Mucous membranes are moist.  Oropharynx non-erythematous. Neck: No stridor.  No meningeal signs.   Cardiovascular: Normal rate, regular rhythm. Good peripheral circulation. Grossly normal heart sounds.   Respiratory: Normal respiratory effort.  No retractions. Lungs CTAB. Gastrointestinal: Soft and nontender. No distention.  Musculoskeletal: No lower extremity tenderness nor edema. No gross deformities of extremities. Neurologic:  Normal speech and language. No gross focal neurologic deficits are appreciated.  Skin:  Skin is warm, dry and intact. No rash noted. Psychiatric: Mood and affect are normal. Speech and behavior are normal.  ____________________________________________   LABS (all labs ordered are listed, but only abnormal results are displayed)  Labs Reviewed  BASIC METABOLIC PANEL - Abnormal; Notable for the following:       Result Value   Glucose, Bld 343 (*)    BUN 26 (*)    All other components within normal limits  URINALYSIS COMPLETEWITH MICROSCOPIC ([facility] ONLY) - Abnormal; Notable for the following:    Color, Urine YELLOW (*)    APPearance HAZY (*)    Glucose, UA >500 (*)    Hgb urine dipstick 2+ (*)    Protein, ur 100 (*)    Bacteria, UA RARE (*)    Squamous Epithelial / LPF 0-5 (*)    All other components within normal limits  GLUCOSE,  CAPILLARY - Abnormal; Notable for the following:    Glucose-Capillary 330 (*)    All other components within normal limits  CBC  CBG MONITORING, ED   ____________________________________________  EKG  None ____________________________________________  RADIOLOGY   No results found.  ____________________________________________   PROCEDURES  Procedure(s) performed:   Procedures   ____________________________________________   INITIAL IMPRESSION / ASSESSMENT AND PLAN / ED COURSE  Pertinent labs & imaging results that were available during my care of the patient were reviewed by me and considered in my medical decision making (see chart for details).  From an emergency medicine perspective, the patient has a reassuring workup.  She is hypertensive but this is likely chronic as well and should be managed by a primary care doctor.  She has no signs or symptoms of hypertensive urgency/emergency and she has no indication of  DKA, HHS, or other emergent complication of uncontrolled diabetes.  I provided her with 1 L of fluids in him starting her on metformin as an outpatient since she was not provided with a prescription by her primary care doctor and does not have a follow-up appointment within 3 weeks.  I encouraged her to continue trying to do the right thing and follow-up with both her primary care doctor and her ophthalmologist.  She and her husband understand and agree with the plan.  Clinical Course    ____________________________________________  FINAL CLINICAL IMPRESSION(S) / ED DIAGNOSES  Final diagnoses:  Type 2 diabetes mellitus with hyperglycemia, without long-term current use of insulin (HCC)  Hyperglycemia     MEDICATIONS GIVEN DURING THIS VISIT:  Medications  sodium chloride 0.9 % bolus 1,000 mL (1,000 mLs Intravenous New Bag/Given 11/26/15 1856)     NEW OUTPATIENT MEDICATIONS STARTED DURING THIS VISIT:  New Prescriptions   METFORMIN (GLUCOPHAGE) 500  MG TABLET    Take 1 tablet (500 mg total) by mouth 2 (two) times daily with a meal.      Note:  This document was prepared using Dragon voice recognition software and may include unintentional dictation errors.    Loleta Rose, MD 11/26/15 2010

## 2015-11-26 NOTE — Discharge Instructions (Signed)
As we discussed, you have no emergent complications associated with your diabetes/hyperglycemia.  Although your blood sugar is elevated, it has been elevated over an extended period of time and you have no evidence of diabetic ketoacidosis, renal dysfunction, hyperglycemic hyperosmolar state, etc.  Your primary care doctor needs to be the one to manage her diabetes as emergency physicians are not experts at outpatient diabetic management.  Since your follow-up appointment is not scheduled for about 3 weeks, we have prescribed metformin which we recommend you take as written starting as soon as possible.  We strongly encourage you to call your primary care doctor back and asked for the next available appointment.  Given the severity of your chronic diabetes, you need to be seen and managed carefully by a primary care doctor.  If she feels he would be best served by an endocrinologist, she may choose to provide a referral, but again your blood work today was reassuring except for your hyperglycemia.  We also recommend that you follow-up with your ophthalmologist in [facility] as planned.    Return to the emergency department if you develop new or worsening symptoms that concern you.

## 2015-11-26 NOTE — ED Triage Notes (Signed)
Patient presents to the ED after visiting her primary care doctor.  Patient states her blood sugar was greater than 400 and her A1c was over 9.  Patient states her physician instructed her to come to the ED.  Patient states she does not check her blood sugar daily and hasn't taken any medication for her blood sugar in "a long time".  Patient states she doesn't know what she is supposed to be taking for her diabetes.  Patient states she was diagnosed with diabetes when she was 21.  Patient states she stopped being able to see out of her left eye on July 16th.  Patient reports recent headaches and fatigue.  Patient denies nausea and vomiting.  Reports urinary frequency.

## 2015-11-26 NOTE — ED Notes (Signed)
Admitting MD at bedside.

## 2015-11-27 DIAGNOSIS — F32A Depression, unspecified: Secondary | ICD-10-CM | POA: Insufficient documentation

## 2015-12-28 ENCOUNTER — Encounter: Payer: Self-pay | Admitting: *Deleted

## 2015-12-28 DIAGNOSIS — Z794 Long term (current) use of insulin: Secondary | ICD-10-CM | POA: Insufficient documentation

## 2015-12-28 DIAGNOSIS — Z79899 Other long term (current) drug therapy: Secondary | ICD-10-CM | POA: Diagnosis not present

## 2015-12-28 DIAGNOSIS — Z5321 Procedure and treatment not carried out due to patient leaving prior to being seen by health care provider: Secondary | ICD-10-CM | POA: Insufficient documentation

## 2015-12-28 DIAGNOSIS — I10 Essential (primary) hypertension: Secondary | ICD-10-CM | POA: Insufficient documentation

## 2015-12-28 DIAGNOSIS — R51 Headache: Secondary | ICD-10-CM | POA: Diagnosis not present

## 2015-12-28 DIAGNOSIS — E119 Type 2 diabetes mellitus without complications: Secondary | ICD-10-CM | POA: Diagnosis not present

## 2015-12-28 DIAGNOSIS — Z7984 Long term (current) use of oral hypoglycemic drugs: Secondary | ICD-10-CM | POA: Diagnosis not present

## 2015-12-28 NOTE — ED Triage Notes (Signed)
Pt to triage via wheelchair.   Pt brought in by ems with a headache.  Pt took tylenol without relief.  Pt has nausea.  Pt alert.  Speech clear.

## 2015-12-28 NOTE — ED Notes (Signed)
No lab or xrays at this time per per dr Zenda Alperswebster.

## 2015-12-29 ENCOUNTER — Emergency Department
Admission: EM | Admit: 2015-12-29 | Discharge: 2015-12-29 | Disposition: A | Discharge status: Home / Self Care | Payer: BLUE CROSS/BLUE SHIELD | Attending: Emergency Medicine | Admitting: Emergency Medicine

## 2015-12-29 NOTE — ED Notes (Signed)
No answer when called from lobby 

## 2016-05-07 ENCOUNTER — Emergency Department (HOSPITAL_COMMUNITY): Payer: BLUE CROSS/BLUE SHIELD

## 2016-05-07 ENCOUNTER — Encounter (HOSPITAL_COMMUNITY): Payer: Self-pay

## 2016-05-07 ENCOUNTER — Emergency Department (HOSPITAL_COMMUNITY)
Admission: EM | Admit: 2016-05-07 | Discharge: 2016-05-07 | Disposition: A | Discharge status: Home / Self Care | Payer: BLUE CROSS/BLUE SHIELD | Attending: Emergency Medicine | Admitting: Emergency Medicine

## 2016-05-07 DIAGNOSIS — Z7982 Long term (current) use of aspirin: Secondary | ICD-10-CM | POA: Diagnosis not present

## 2016-05-07 DIAGNOSIS — Z794 Long term (current) use of insulin: Secondary | ICD-10-CM | POA: Insufficient documentation

## 2016-05-07 DIAGNOSIS — Z79899 Other long term (current) drug therapy: Secondary | ICD-10-CM | POA: Diagnosis not present

## 2016-05-07 DIAGNOSIS — R51 Headache: Secondary | ICD-10-CM | POA: Diagnosis not present

## 2016-05-07 DIAGNOSIS — I10 Essential (primary) hypertension: Secondary | ICD-10-CM | POA: Diagnosis not present

## 2016-05-07 DIAGNOSIS — R519 Headache, unspecified: Secondary | ICD-10-CM

## 2016-05-07 DIAGNOSIS — E119 Type 2 diabetes mellitus without complications: Secondary | ICD-10-CM | POA: Diagnosis not present

## 2016-05-07 LAB — POC URINE PREG, ED: PREG TEST UR: NEGATIVE

## 2016-05-07 LAB — CBG MONITORING, ED: GLUCOSE-CAPILLARY: 140 mg/dL — AB (ref 65–99)

## 2016-05-07 MED ORDER — DIPHENHYDRAMINE HCL 50 MG/ML IJ SOLN
25.0000 mg | Freq: Once | INTRAMUSCULAR | Status: AC
  Administered 2016-05-07: 25 mg via INTRAVENOUS
  Filled 2016-05-07: qty 1

## 2016-05-07 MED ORDER — KETOROLAC TROMETHAMINE 30 MG/ML IJ SOLN
30.0000 mg | Freq: Once | INTRAMUSCULAR | Status: AC
  Administered 2016-05-07: 30 mg via INTRAVENOUS
  Filled 2016-05-07: qty 1

## 2016-05-07 MED ORDER — PROCHLORPERAZINE EDISYLATE 5 MG/ML IJ SOLN
10.0000 mg | Freq: Once | INTRAMUSCULAR | Status: AC
  Administered 2016-05-07: 10 mg via INTRAVENOUS
  Filled 2016-05-07: qty 2

## 2016-05-07 MED ORDER — MAGNESIUM SULFATE 2 GM/50ML IV SOLN
2.0000 g | Freq: Once | INTRAVENOUS | Status: AC
  Administered 2016-05-07: 2 g via INTRAVENOUS
  Filled 2016-05-07: qty 50

## 2016-05-07 MED ORDER — SODIUM CHLORIDE 0.9 % IV BOLUS (SEPSIS)
1000.0000 mL | Freq: Once | INTRAVENOUS | Status: AC
  Administered 2016-05-07: 1000 mL via INTRAVENOUS

## 2016-05-07 MED ORDER — PROCHLORPERAZINE MALEATE 10 MG PO TABS: 10.0000 mg | ORAL_TABLET | Freq: Three times a day (TID) | ORAL | 0 refills | Status: DC | PRN

## 2016-05-07 MED ORDER — DIPHENHYDRAMINE HCL 25 MG PO CAPS
25.0000 mg | ORAL_CAPSULE | Freq: Three times a day (TID) | ORAL | 0 refills | Status: DC | PRN
Start: 2016-05-07 — End: 2016-07-01

## 2016-05-07 NOTE — ED Provider Notes (Signed)
[facility]-EMERGENCY DEPT Provider Note   CSN: [id number] Arrival date & time: 05/07/16 1106     History    Chief Complaint  Patient presents with  . Headache     HPI Vickie Montoya is a 41 y.o. female.  41yo F w/ T2DM, diabetic retinopathy with legal blindness, HTN who p/w headache. One week ago, the patient had a gradual onset of progressively worsening headache located on the left side of her head around her left eye, wrapping around to the back of her head. The headache has been intermittent and severe since it began. She has had headaches before but they do not usually last this long. She denies any head trauma, falls, fevers, or recent illness. No changes from her baseline of vision. No problems ambulating, confusion, speech problems, extremity numbness/weakness. Her blood sugar has been running higher recently but she denies any urinary symptoms or recent illness.   Past Medical History:  Diagnosis Date  . Depression   . Diabetes mellitus without complication (HCC)   . Hypertension      Patient Active Problem List   Diagnosis Date Noted  . Cellulitis and abscess 07/13/2015  . Abscess of lower back 07/02/2015    Past Surgical History:  Procedure Laterality Date  . CESAREAN SECTION    . TUBAL LIGATION    . tubiligation      OB History    No data available        Home Medications    Prior to Admission medications   Medication Sig Start Date End Date Taking? Authorizing Provider  albuterol (PROVENTIL HFA;VENTOLIN HFA) 108 (90 Base) MCG/ACT inhaler Inhale 1-2 puffs into the lungs every 6 (six) hours as needed for wheezing or shortness of breath. 05/01/15  Yes Barbaraann Barthel, NP  aspirin EC 81 MG tablet Take 81 mg by mouth daily.   Yes Historical Provider, MD  FLUoxetine (PROZAC) 10 MG tablet Take 10 mg by mouth daily.   Yes Historical Provider, MD  insulin glargine (LANTUS) 100 UNIT/ML injection Inject 44 Units into the skin at bedtime.    Yes Historical  Provider, MD  lisinopril (PRINIVIL,ZESTRIL) 10 MG tablet Take 10 mg by mouth daily.   Yes Historical Provider, MD  metFORMIN (GLUCOPHAGE-XR) 750 MG 24 hr tablet Take 750 mg by mouth daily with breakfast.   Yes Historical Provider, MD  pravastatin (PRAVACHOL) 40 MG tablet Take 40 mg by mouth daily.   Yes Historical Provider, MD  diphenhydrAMINE (BENADRYL) 25 mg capsule Take 1 capsule (25 mg total) by mouth every 8 (eight) hours as needed (at onset of headache). 05/07/16   Laurence Spates, MD  insulin lispro (HUMALOG) 100 UNIT/ML injection Inject into the skin 3 (three) times daily before meals. Sliding scale    Historical Provider, MD  metFORMIN (GLUCOPHAGE) 500 MG tablet Take 1 tablet (500 mg total) by mouth 2 (two) times daily with a meal. Patient not taking: Reported on 05/07/2016 11/26/15   Loleta Rose, MD  prochlorperazine (COMPAZINE) 10 MG tablet Take 1 tablet (10 mg total) by mouth every 8 (eight) hours as needed (at onset of Nausea and headache). 05/07/16   Laurence Spates, MD      Family History  Problem Relation Age of Onset  . Hyperlipidemia Mother      Social History  Substance Use Topics  . Smoking status: Never Smoker  . Smokeless tobacco: Never Used  . Alcohol use No     Allergies     Patient  has no known allergies.    Review of Systems  10 Systems reviewed and are negative for acute change except as noted in the HPI.   Physical Exam Updated Vital Signs BP (!) 188/107   Pulse 91   Temp 98.7 F (37.1 C) (Oral)   Resp 16   Ht 5\' 7"  (1.702 m)   Wt 235 lb 11.2 oz (106.9 kg)   LMP 04/14/2016   SpO2 98%   BMI 36.92 kg/m   Physical Exam  Constitutional: She is oriented to person, place, and time. She appears well-developed and well-nourished. No distress.  Uncomfortable, holding head, Awake, alert  HENT:  Head: Normocephalic and atraumatic.  Eyes: EOM are normal. Pupils are equal, round, and reactive to light.  L eye w/ scleral injection  Neck:  Neck supple.  Cardiovascular: Normal rate, regular rhythm and normal heart sounds.   No murmur heard. Pulmonary/Chest: Effort normal and breath sounds normal. No respiratory distress.  Abdominal: Soft. Bowel sounds are normal. She exhibits no distension. There is no tenderness.  Musculoskeletal: She exhibits no edema.  Neurological: She is alert and oriented to person, place, and time. She has normal reflexes. She displays normal reflexes. No cranial nerve deficit. She exhibits normal muscle tone.  Fluent speech, difficulty with normal finger-to-nose testing due to poor vision, negative pronator drift, no clonus 5/5 strength and normal sensation x all 4 extremities  Skin: Skin is warm and dry.  Psychiatric: She has a normal mood and affect. Thought content normal.  Nursing note and vitals reviewed.     ED Treatments / Results  Labs (all labs ordered are listed, but only abnormal results are displayed) Labs Reviewed  CBG MONITORING, ED - Abnormal; Notable for the following:       Result Value   Glucose-Capillary 140 (*)    All other components within normal limits  POC URINE PREG, ED     EKG  EKG Interpretation  Date/Time:    Ventricular Rate:    PR Interval:    QRS Duration:   QT Interval:    QTC Calculation:   R Axis:     Text Interpretation:           Radiology Ct Head Wo Contrast  Result Date: 05/07/2016 CLINICAL DATA:  Left-sided headache.  Hypertension and diabetes EXAM: CT HEAD WITHOUT CONTRAST TECHNIQUE: Contiguous axial images were obtained from the base of the skull through the vertex without intravenous contrast. COMPARISON:  None. FINDINGS: Brain: No evidence of acute infarction, hemorrhage, hydrocephalus, extra-axial collection or mass lesion/mass effect. Vascular: No hyperdense vessel or unexpected calcification. Skull: Negative Sinuses/Orbits: Left lens replacement. No orbital lesion. Sinuses are clear. Other: None IMPRESSION: Negative CT head  Electronically Signed   By: Marlan Palauharles  Clark M.D.   On: 05/07/2016 16:25    Procedures Procedures (including critical care time) Procedures  Medications Ordered in ED  Medications  sodium chloride 0.9 % bolus 1,000 mL (0 mLs Intravenous Stopped 05/07/16 1523)  diphenhydrAMINE (BENADRYL) injection 25 mg (25 mg Intravenous Given 05/07/16 1346)  prochlorperazine (COMPAZINE) injection 10 mg (10 mg Intravenous Given 05/07/16 1346)  magnesium sulfate IVPB 2 g 50 mL (0 g Intravenous Stopped 05/07/16 1642)  diphenhydrAMINE (BENADRYL) injection 25 mg (25 mg Intravenous Given 05/07/16 1515)  prochlorperazine (COMPAZINE) injection 10 mg (10 mg Intravenous Given 05/07/16 1515)  ketorolac (TORADOL) 30 MG/ML injection 30 mg (30 mg Intravenous Given 05/07/16 1642)     Initial Impression / Assessment and Plan / ED Course  I  have reviewed the triage vital signs and the nursing notes.  Pertinent labs & imaging results that were available during my care of the patient were reviewed by me and considered in my medical decision making (see chart for details).  Clinical Course    PT w/ 1 week of persistent headache that was gradual in onset. No infectious sx. she was uncomfortable on exam, vital signs notable for hypertension at 164/103. She had a normal neurologic exam. She has poor vision at baseline. She had scleral injection on left eye which she and family state is her baseline since previous eye surgeries for diabetic retinopathy.  Because she has not had any headaches lasting this long, I did obtain a head CT which was negative for acute process. Blood glucose reassuring at 140. Gave the patient the above medications. On reexamination, she stated that her headache was significantly improved from when she arrived. She has had no concerning neurologic symptoms and normal neurologic exam here, and she denies any sudden onset of headache therefore I feel she is safe for discharge. I discussed supportive care and  extensively reviewed return precautions with the patient and her husband including any strokelike symptoms, new eye symptoms, or worsening pain. Patient voiced understanding and was discharged in satisfactory condition.  Final Clinical Impressions(s) / ED Diagnoses   Final diagnoses:  Nonintractable headache, unspecified chronicity pattern, unspecified headache type     New Prescriptions   DIPHENHYDRAMINE (BENADRYL) 25 MG CAPSULE    Take 1 capsule (25 mg total) by mouth every 8 (eight) hours as needed (at onset of headache).   PROCHLORPERAZINE (COMPAZINE) 10 MG TABLET    Take 1 tablet (10 mg total) by mouth every 8 (eight) hours as needed (at onset of Nausea and headache).       Laurence Spates, MD 05/07/16 [phone]

## 2016-05-07 NOTE — Discharge Instructions (Signed)
You may take benadryl and compazine together at onset of headache, along with tylenol or motrin. Return immediately if you have any eye problems or stroke-like symptoms such as weakness/numbness, speech problems, or balance problems.

## 2016-05-07 NOTE — ED Notes (Signed)
Patient transported to CT 

## 2016-05-07 NOTE — ED Notes (Signed)
1L NS started at 1336

## 2016-05-07 NOTE — ED Triage Notes (Signed)
Pt presents with onset of L temporal headache x 1 weeks.  Pt taking tylenol without relief.  +nausea and photophobia; denies h/o migraines.

## 2016-05-08 ENCOUNTER — Telehealth: Payer: Self-pay | Admitting: *Deleted

## 2016-05-08 NOTE — Telephone Encounter (Signed)
Reviewed EDV where pt was seen for Migraine Headaches and given  RX for Benadryl 25mg  po q 8H, and Compazine 10mg  po q 8H. [facility]. Caller very understanding and willing to talk with Pharmacy to work around his dilemma. No further CM needs.

## 2016-07-01 ENCOUNTER — Emergency Department (HOSPITAL_COMMUNITY)
Admission: EM | Admit: 2016-07-01 | Discharge: 2016-07-01 | Disposition: A | Discharge status: Home / Self Care | Payer: BLUE CROSS/BLUE SHIELD | Attending: Emergency Medicine | Admitting: Emergency Medicine

## 2016-07-01 ENCOUNTER — Encounter (HOSPITAL_COMMUNITY): Payer: Self-pay | Admitting: Nurse Practitioner

## 2016-07-01 DIAGNOSIS — I10 Essential (primary) hypertension: Secondary | ICD-10-CM | POA: Insufficient documentation

## 2016-07-01 DIAGNOSIS — E119 Type 2 diabetes mellitus without complications: Secondary | ICD-10-CM | POA: Diagnosis not present

## 2016-07-01 DIAGNOSIS — R112 Nausea with vomiting, unspecified: Secondary | ICD-10-CM | POA: Insufficient documentation

## 2016-07-01 DIAGNOSIS — Z7982 Long term (current) use of aspirin: Secondary | ICD-10-CM | POA: Diagnosis not present

## 2016-07-01 DIAGNOSIS — Z794 Long term (current) use of insulin: Secondary | ICD-10-CM | POA: Diagnosis not present

## 2016-07-01 DIAGNOSIS — R197 Diarrhea, unspecified: Secondary | ICD-10-CM | POA: Diagnosis not present

## 2016-07-01 LAB — CBC WITH DIFFERENTIAL/PLATELET
BASOS ABS: 0 10*3/uL (ref 0.0–0.1)
BASOS PCT: 0 %
EOS ABS: 0 10*3/uL (ref 0.0–0.7)
Eosinophils Relative: 0 %
HCT: 36.9 % (ref 36.0–46.0)
HEMOGLOBIN: 12.6 g/dL (ref 12.0–15.0)
Lymphocytes Relative: 3 %
Lymphs Abs: 0.5 10*3/uL — ABNORMAL LOW (ref 0.7–4.0)
MCH: 29 pg (ref 26.0–34.0)
MCHC: 34.1 g/dL (ref 30.0–36.0)
MCV: 85 fL (ref 78.0–100.0)
MONO ABS: 0.3 10*3/uL (ref 0.1–1.0)
MONOS PCT: 1 %
NEUTROS PCT: 96 %
Neutro Abs: 19.1 10*3/uL — ABNORMAL HIGH (ref 1.7–7.7)
Platelets: 309 10*3/uL (ref 150–400)
RBC: 4.34 MIL/uL (ref 3.87–5.11)
RDW: 12.9 % (ref 11.5–15.5)
WBC: 19.9 10*3/uL — ABNORMAL HIGH (ref 4.0–10.5)

## 2016-07-01 LAB — URINALYSIS, ROUTINE W REFLEX MICROSCOPIC
BILIRUBIN URINE: NEGATIVE
KETONES UR: 20 mg/dL — AB
LEUKOCYTES UA: NEGATIVE
NITRITE: NEGATIVE
PH: 6 (ref 5.0–8.0)
Protein, ur: >=300 mg/dL — AB
Specific Gravity, Urine: 1.012 (ref 1.005–1.030)

## 2016-07-01 LAB — COMPREHENSIVE METABOLIC PANEL
ALBUMIN: 3.3 g/dL — AB (ref 3.5–5.0)
ALT: 17 U/L (ref 14–54)
ANION GAP: 16 — AB (ref 5–15)
AST: 23 U/L (ref 15–41)
Alkaline Phosphatase: 50 U/L (ref 38–126)
BUN: 22 mg/dL — ABNORMAL HIGH (ref 6–20)
CHLORIDE: 103 mmol/L (ref 101–111)
CO2: 17 mmol/L — AB (ref 22–32)
Calcium: 9.2 mg/dL (ref 8.9–10.3)
Creatinine, Ser: 1.13 mg/dL — ABNORMAL HIGH (ref 0.44–1.00)
GFR calc Af Amer: >60 mL/min (ref >60)
GFR calc non Af Amer: 60 mL/min — ABNORMAL LOW (ref >60)
GLUCOSE: 276 mg/dL — AB (ref 65–99)
POTASSIUM: 3.5 mmol/L (ref 3.5–5.1)
SODIUM: 136 mmol/L (ref 135–145)
Total Bilirubin: 1 mg/dL (ref 0.3–1.2)
Total Protein: 7.5 g/dL (ref 6.5–8.1)

## 2016-07-01 LAB — BASIC METABOLIC PANEL
ANION GAP: 11 (ref 5–15)
BUN: 18 mg/dL (ref 6–20)
CALCIUM: 7.5 mg/dL — AB (ref 8.9–10.3)
CO2: 17 mmol/L — AB (ref 22–32)
Chloride: 110 mmol/L (ref 101–111)
Creatinine, Ser: 0.97 mg/dL (ref 0.44–1.00)
GFR calc Af Amer: >60 mL/min (ref >60)
GFR calc non Af Amer: >60 mL/min (ref >60)
GLUCOSE: 277 mg/dL — AB (ref 65–99)
Potassium: 3.8 mmol/L (ref 3.5–5.1)
Sodium: 138 mmol/L (ref 135–145)

## 2016-07-01 LAB — LIPASE, BLOOD: Lipase: 23 U/L (ref 11–51)

## 2016-07-01 LAB — I-STAT BETA HCG BLOOD, ED (MC, WL, AP ONLY): I-stat hCG, quantitative: <5 m[IU]/mL (ref <5)

## 2016-07-01 MED ORDER — ONDANSETRON HCL 4 MG/2ML IJ SOLN
4.0000 mg | Freq: Once | INTRAMUSCULAR | Status: AC
Start: 2016-07-01 — End: 2016-07-01
  Administered 2016-07-01: 4 mg via INTRAVENOUS
  Filled 2016-07-01: qty 2

## 2016-07-01 MED ORDER — SODIUM CHLORIDE 0.9 % IV BOLUS (SEPSIS)
1000.0000 mL | Freq: Once | INTRAVENOUS | Status: AC
  Administered 2016-07-01: 1000 mL via INTRAVENOUS

## 2016-07-01 MED ORDER — DIPHENHYDRAMINE HCL 50 MG/ML IJ SOLN
25.0000 mg | Freq: Once | INTRAMUSCULAR | Status: AC
  Administered 2016-07-01: 25 mg via INTRAVENOUS
  Filled 2016-07-01: qty 1

## 2016-07-01 MED ORDER — GI COCKTAIL ~~LOC~~
30.0000 mL | Freq: Once | ORAL | Status: AC
  Administered 2016-07-01: 30 mL via ORAL
  Filled 2016-07-01: qty 30

## 2016-07-01 MED ORDER — METOCLOPRAMIDE HCL 5 MG/ML IJ SOLN
10.0000 mg | Freq: Once | INTRAMUSCULAR | Status: AC
  Administered 2016-07-01: 10 mg via INTRAVENOUS
  Filled 2016-07-01: qty 2

## 2016-07-01 NOTE — ED Notes (Signed)
RN AT BEDSIDE COLLECTING LABS 

## 2016-07-01 NOTE — ED Provider Notes (Signed)
[facility]-EMERGENCY DEPT Provider Note   CSN: [id number] Arrival date & time: 07/01/16  1603     History   Chief Complaint Chief Complaint  Patient presents with  . N/V/D    HPI Vickie Montoya is a 41 y.o. female.  41 yo F with a cc of n/v/d.  Going on since yesterday.  Denies sick contacts, denies fevers.  Crampy abdominal pain. She denies suspicious food intake.   The history is provided by the patient.  Illness  This is a new problem. The current episode started less than 1 hour ago. The problem occurs constantly. The problem has not changed since onset.Associated symptoms include abdominal pain. Pertinent negatives include no chest pain, no headaches and no shortness of breath. Nothing aggravates the symptoms. Nothing relieves the symptoms. She has tried nothing for the symptoms. The treatment provided no relief.    Past Medical History:  Diagnosis Date  . Depression   . Diabetes mellitus without complication (HCC)   . Hypertension     Patient Active Problem List   Diagnosis Date Noted  . Cellulitis and abscess 07/13/2015  . Abscess of lower back 07/02/2015    Past Surgical History:  Procedure Laterality Date  . CESAREAN SECTION    . TUBAL LIGATION    . tubiligation      OB History    No data available       Home Medications    Prior to Admission medications   Medication Sig Start Date End Date Taking? Authorizing Provider  aspirin EC 81 MG tablet Take 81 mg by mouth at bedtime.    Yes Historical Provider, MD  FLUoxetine (PROZAC) 10 MG tablet Take 10 mg by mouth daily.   Yes Historical Provider, MD  insulin glargine (LANTUS) 100 unit/mL SOPN Inject 50 Units into the skin at bedtime.   Yes Historical Provider, MD  lisinopril (PRINIVIL,ZESTRIL) 20 MG tablet Take 20 mg by mouth at bedtime.   Yes Historical Provider, MD  metFORMIN (GLUCOPHAGE-XR) 750 MG 24 hr tablet Take 750 mg by mouth daily with supper.    Yes Historical Provider, MD  pravastatin (PRAVACHOL)  40 MG tablet Take 40 mg by mouth at bedtime.    Yes Historical Provider, MD  promethazine (PHENERGAN) 12.5 MG tablet Take 12.5 mg by mouth every 6 (six) hours as needed for nausea or vomiting.   Yes Historical Provider, MD  SUMAtriptan (IMITREX) 25 MG tablet Take 25 mg by mouth every 2 (two) hours as needed for migraine.   Yes Historical Provider, MD  metFORMIN (GLUCOPHAGE-XR) 500 MG 24 hr tablet Take 1,000 mg by mouth daily with supper.    Historical Provider, MD    Family History Family History  Problem Relation Age of Onset  . Hyperlipidemia Mother     Social History Social History  Substance Use Topics  . Smoking status: Never Smoker  . Smokeless tobacco: Never Used  . Alcohol use No     Allergies   Patient has no known allergies.   Review of Systems Review of Systems  Constitutional: Negative for chills and fever.  HENT: Negative for congestion and rhinorrhea.   Eyes: Negative for redness and visual disturbance.  Respiratory: Negative for shortness of breath and wheezing.   Cardiovascular: Negative for chest pain and palpitations.  Gastrointestinal: Positive for abdominal pain. Negative for nausea and vomiting.  Genitourinary: Negative for dysuria and urgency.  Musculoskeletal: Negative for arthralgias and myalgias.  Skin: Negative for pallor and wound.  Neurological: Negative  for dizziness and headaches.     Physical Exam Updated Vital Signs BP 167/86 (BP Location: Left Arm)   Pulse 118   Temp 98.2 F (36.8 C) (Oral)   Resp 18   SpO2 98%   Physical Exam  Constitutional: She is oriented to person, place, and time. She appears well-developed and well-nourished. No distress.  HENT:  Head: Normocephalic and atraumatic.  Eyes: EOM are normal. Pupils are equal, round, and reactive to light.  Neck: Normal range of motion. Neck supple.  Cardiovascular: Normal rate and regular rhythm.  Exam reveals no gallop and no friction rub.   No murmur  heard. Pulmonary/Chest: Effort normal. She has no wheezes. She has no rales.  Abdominal: Soft. She exhibits no distension and no mass. There is tenderness (mild diffuse upper). There is no guarding.  Musculoskeletal: She exhibits no edema or tenderness.  Neurological: She is alert and oriented to person, place, and time.  Skin: Skin is warm and dry. She is not diaphoretic.  Psychiatric: She has a normal mood and affect. Her behavior is normal.  Nursing note and vitals reviewed.    ED Treatments / Results  Labs (all labs ordered are listed, but only abnormal results are displayed) Labs Reviewed  CBC WITH DIFFERENTIAL/PLATELET - Abnormal; Notable for the following:       Result Value   WBC 19.9 (*)    Neutro Abs 19.1 (*)    Lymphs Abs 0.5 (*)    All other components within normal limits  COMPREHENSIVE METABOLIC PANEL - Abnormal; Notable for the following:    CO2 17 (*)    Glucose, Bld 276 (*)    BUN 22 (*)    Creatinine, Ser 1.13 (*)    Albumin 3.3 (*)    GFR calc non Af Amer 60 (*)    Anion gap 16 (*)    All other components within normal limits  URINALYSIS, ROUTINE W REFLEX MICROSCOPIC - Abnormal; Notable for the following:    APPearance HAZY (*)    Glucose, UA >=500 (*)    Hgb urine dipstick LARGE (*)    Ketones, ur 20 (*)    Protein, ur >=300 (*)    Bacteria, UA RARE (*)    Squamous Epithelial / LPF 0-5 (*)    All other components within normal limits  BASIC METABOLIC PANEL - Abnormal; Notable for the following:    CO2 17 (*)    Glucose, Bld 277 (*)    Calcium 7.5 (*)    All other components within normal limits  LIPASE, BLOOD  I-STAT BETA HCG BLOOD, ED ([facility], [facility], [facility] ONLY)    EKG  EKG Interpretation None       Radiology No results found.  Procedures Procedures (including critical care time)  Medications Ordered in ED Medications  sodium chloride 0.9 % bolus 1,000 mL (0 mLs Intravenous Stopped 07/01/16 1919)  ondansetron (ZOFRAN) injection 4 mg (4 mg  Intravenous Given 07/01/16 1719)  gi cocktail (Maalox,Lidocaine,Donnatal) (30 mLs Oral Given 07/01/16 1702)  metoCLOPramide (REGLAN) injection 10 mg (10 mg Intravenous Given 07/01/16 1920)  diphenhydrAMINE (BENADRYL) injection 25 mg (25 mg Intravenous Given 07/01/16 1917)  sodium chloride 0.9 % bolus 1,000 mL (0 mLs Intravenous Stopped 07/01/16 2109)  sodium chloride 0.9 % bolus 1,000 mL (1,000 mLs Intravenous New Bag/Given 07/01/16 2109)     Initial Impression / Assessment and Plan / ED Course  I have reviewed the triage vital signs and the nursing notes.  Pertinent labs &  imaging results that were available during my care of the patient were reviewed by me and considered in my medical decision making (see chart for details).     41 yo F with a cc of n/v/d. Check labs. Give fluids antiemetics.    Patient was given repeat fluids and antiemetics due to continued vomiting. Initial labs concerning for an anion gap acidosis. Patient had trace ketones in her urine. Repeated with closure of the gap. Patient feeling better able to tolerate by mouth. Discharge home.  10:10 PM:  I have discussed the diagnosis/risks/treatment options with the patient and family and believe the pt to be eligible for discharge home to follow-up with PCP. We also discussed returning to the ED immediately if new or worsening sx occur. We discussed the sx which are most concerning (e.g., sudden worsening pain, fever, inability to tolerate by mouth) that necessitate immediate return. Medications administered to the patient during their visit and any new prescriptions provided to the patient are listed below.  Medications given during this visit Medications  sodium chloride 0.9 % bolus 1,000 mL (0 mLs Intravenous Stopped 07/01/16 1919)  ondansetron (ZOFRAN) injection 4 mg (4 mg Intravenous Given 07/01/16 1719)  gi cocktail (Maalox,Lidocaine,Donnatal) (30 mLs Oral Given 07/01/16 1702)  metoCLOPramide (REGLAN) injection 10 mg (10 mg  Intravenous Given 07/01/16 1920)  diphenhydrAMINE (BENADRYL) injection 25 mg (25 mg Intravenous Given 07/01/16 1917)  sodium chloride 0.9 % bolus 1,000 mL (0 mLs Intravenous Stopped 07/01/16 2109)  sodium chloride 0.9 % bolus 1,000 mL (1,000 mLs Intravenous New Bag/Given 07/01/16 2109)     The patient appears reasonably screen and/or stabilized for discharge and I doubt any other medical condition or other [facility] requiring further screening, evaluation, or treatment in the ED at this time prior to discharge.   Final Clinical Impressions(s) / ED Diagnoses   Final diagnoses:  Nausea vomiting and diarrhea    New Prescriptions New Prescriptions   No medications on file     Melene Plan, DO 07/01/16 2210

## 2016-07-01 NOTE — ED Triage Notes (Signed)
Pt is presented for evaluation of "non-stop N/V/D" with onset of 24 hrs ago. Hx of DM, HTN and Migraines. Zofran 4 mg IV from EMS and Phenergan 50 PO have not helped, pt is still actively vomiting.

## 2016-08-24 ENCOUNTER — Other Ambulatory Visit: Payer: Self-pay | Admitting: Medical Oncology

## 2016-08-24 DIAGNOSIS — Z1231 Encounter for screening mammogram for malignant neoplasm of breast: Secondary | ICD-10-CM

## 2016-09-05 ENCOUNTER — Ambulatory Visit: Admission: RE | Admit: 2016-09-05 | Payer: BLUE CROSS/BLUE SHIELD | Source: Ambulatory Visit

## 2016-09-19 DIAGNOSIS — E7800 Pure hypercholesterolemia, unspecified: Secondary | ICD-10-CM | POA: Insufficient documentation

## 2016-09-19 DIAGNOSIS — R809 Proteinuria, unspecified: Secondary | ICD-10-CM | POA: Insufficient documentation

## 2017-01-26 ENCOUNTER — Emergency Department: Payer: BLUE CROSS/BLUE SHIELD

## 2017-01-26 ENCOUNTER — Emergency Department
Admission: EM | Admit: 2017-01-26 | Discharge: 2017-01-26 | Disposition: A | Discharge status: Home / Self Care | Payer: BLUE CROSS/BLUE SHIELD | Attending: Emergency Medicine | Admitting: Emergency Medicine

## 2017-01-26 ENCOUNTER — Encounter: Payer: Self-pay | Admitting: Emergency Medicine

## 2017-01-26 DIAGNOSIS — R739 Hyperglycemia, unspecified: Secondary | ICD-10-CM

## 2017-01-26 DIAGNOSIS — N390 Urinary tract infection, site not specified: Secondary | ICD-10-CM | POA: Insufficient documentation

## 2017-01-26 DIAGNOSIS — Z794 Long term (current) use of insulin: Secondary | ICD-10-CM | POA: Diagnosis not present

## 2017-01-26 DIAGNOSIS — E1165 Type 2 diabetes mellitus with hyperglycemia: Secondary | ICD-10-CM | POA: Diagnosis not present

## 2017-01-26 DIAGNOSIS — Z79899 Other long term (current) drug therapy: Secondary | ICD-10-CM | POA: Diagnosis not present

## 2017-01-26 DIAGNOSIS — R531 Weakness: Secondary | ICD-10-CM | POA: Diagnosis present

## 2017-01-26 DIAGNOSIS — R112 Nausea with vomiting, unspecified: Secondary | ICD-10-CM | POA: Diagnosis not present

## 2017-01-26 DIAGNOSIS — Z7982 Long term (current) use of aspirin: Secondary | ICD-10-CM | POA: Diagnosis not present

## 2017-01-26 LAB — COMPREHENSIVE METABOLIC PANEL
ALBUMIN: 3.3 g/dL — AB (ref 3.5–5.0)
ALK PHOS: 53 U/L (ref 38–126)
ALT: 18 U/L (ref 14–54)
ANION GAP: 16 — AB (ref 5–15)
AST: 25 U/L (ref 15–41)
BILIRUBIN TOTAL: 0.6 mg/dL (ref 0.3–1.2)
BUN: 23 mg/dL — AB (ref 6–20)
CHLORIDE: 104 mmol/L (ref 101–111)
CO2: 16 mmol/L — AB (ref 22–32)
CREATININE: 1.07 mg/dL — AB (ref 0.44–1.00)
Calcium: 9.1 mg/dL (ref 8.9–10.3)
GFR calc Af Amer: >60 mL/min (ref >60)
GFR calc non Af Amer: >60 mL/min (ref >60)
Glucose, Bld: 321 mg/dL — ABNORMAL HIGH (ref 65–99)
POTASSIUM: 3.9 mmol/L (ref 3.5–5.1)
Sodium: 136 mmol/L (ref 135–145)
Total Protein: 7.7 g/dL (ref 6.5–8.1)

## 2017-01-26 LAB — GLUCOSE, CAPILLARY
Glucose-Capillary: 325 mg/dL — ABNORMAL HIGH (ref 65–99)
Glucose-Capillary: 234 mg/dL — ABNORMAL HIGH (ref 65–99)

## 2017-01-26 LAB — URINALYSIS, ROUTINE W REFLEX MICROSCOPIC
BILIRUBIN URINE: NEGATIVE
Glucose, UA: >=500 mg/dL — AB
Ketones, ur: 20 mg/dL — AB
LEUKOCYTES UA: NEGATIVE
Nitrite: NEGATIVE
PH: 5 (ref 5.0–8.0)
Protein, ur: >=300 mg/dL — AB
Specific Gravity, Urine: 1.018 (ref 1.005–1.030)

## 2017-01-26 LAB — BASIC METABOLIC PANEL
ANION GAP: 10 (ref 5–15)
BUN: 21 mg/dL — ABNORMAL HIGH (ref 6–20)
CHLORIDE: 108 mmol/L (ref 101–111)
CO2: 20 mmol/L — AB (ref 22–32)
Calcium: 8 mg/dL — ABNORMAL LOW (ref 8.9–10.3)
Creatinine, Ser: 1.05 mg/dL — ABNORMAL HIGH (ref 0.44–1.00)
GFR calc Af Amer: >60 mL/min (ref >60)
GLUCOSE: 255 mg/dL — AB (ref 65–99)
POTASSIUM: 3.5 mmol/L (ref 3.5–5.1)
Sodium: 138 mmol/L (ref 135–145)

## 2017-01-26 LAB — CBC
HEMATOCRIT: 31 % — AB (ref 35.0–47.0)
HEMOGLOBIN: 10.3 g/dL — AB (ref 12.0–16.0)
MCH: 25.8 pg — AB (ref 26.0–34.0)
MCHC: 33.2 g/dL (ref 32.0–36.0)
MCV: 77.9 fL — AB (ref 80.0–100.0)
Platelets: 436 10*3/uL (ref 150–440)
RBC: 3.98 MIL/uL (ref 3.80–5.20)
RDW: 16.6 % — AB (ref 11.5–14.5)
WBC: 12 10*3/uL — ABNORMAL HIGH (ref 3.6–11.0)

## 2017-01-26 LAB — LIPASE, BLOOD: Lipase: 39 U/L (ref 11–51)

## 2017-01-26 LAB — SALICYLATE LEVEL

## 2017-01-26 MED ORDER — INSULIN ASPART 100 UNIT/ML ~~LOC~~ SOLN
6.0000 [IU] | Freq: Once | SUBCUTANEOUS | Status: AC
  Administered 2017-01-26: 6 [IU] via INTRAVENOUS
  Filled 2017-01-26: qty 1

## 2017-01-26 MED ORDER — PROMETHAZINE HCL 25 MG/ML IJ SOLN
25.0000 mg | Freq: Once | INTRAMUSCULAR | Status: AC
  Administered 2017-01-26: 25 mg via INTRAVENOUS
  Filled 2017-01-26: qty 1

## 2017-01-26 MED ORDER — MORPHINE SULFATE (PF) 4 MG/ML IV SOLN
4.0000 mg | Freq: Once | INTRAVENOUS | Status: AC
  Administered 2017-01-26: 4 mg via INTRAVENOUS
  Filled 2017-01-26: qty 1

## 2017-01-26 MED ORDER — ONDANSETRON HCL 4 MG/2ML IJ SOLN
4.0000 mg | Freq: Once | INTRAMUSCULAR | Status: AC
  Administered 2017-01-26: 4 mg via INTRAVENOUS
  Filled 2017-01-26: qty 2

## 2017-01-26 MED ORDER — CEPHALEXIN 500 MG PO CAPS: 500.0000 mg | ORAL_CAPSULE | Freq: Three times a day (TID) | ORAL | 0 refills | Status: DC

## 2017-01-26 MED ORDER — CEPHALEXIN 500 MG PO CAPS
500.0000 mg | ORAL_CAPSULE | Freq: Once | ORAL | Status: AC
  Administered 2017-01-26: 500 mg via ORAL
  Filled 2017-01-26: qty 1

## 2017-01-26 MED ORDER — SODIUM CHLORIDE 0.9 % IV BOLUS (SEPSIS)
1000.0000 mL | Freq: Once | INTRAVENOUS | Status: AC
  Administered 2017-01-26: 1000 mL via INTRAVENOUS

## 2017-01-26 NOTE — ED Triage Notes (Signed)
Pt comes into the ED via [facility] from home c/o hyperglycemia, N/V, abd pain.  Patient has been taking medications as prescribed, but noticed her BS has been increasing.  Denies any chest pain, shortness of breath, or dizziness.

## 2017-01-26 NOTE — ED Provider Notes (Signed)
[facility] Emergency Department Provider Note  Time seen: 3:28 PM  I have reviewed the triage vital signs and the nursing notes.   HISTORY  Chief Complaint Hyperglycemia    HPI FRAIDA VELDMAN is a 41 y.o. female With a past medical history of depression, hypertension, diabetes, presents to the emergency department for elevated blood glucose nausea and vomiting. According to the patient for the past 3 days she has not been feeling well, been feeling weak with nausea and vomiting fairly diffuse upper abdominal discomfort. Denies any fever, cough or congestion. Denies any chest pain or shortness of breath. Patient denies any prior history of DKA. States her blood sugar has been running high at home. She has been taking her medications and insulin as prescribed and has not missed any doses per patient.  Past Medical History:  Diagnosis Date  . Depression   . Diabetes mellitus without complication (HCC)   . Hypertension     Patient Active Problem List   Diagnosis Date Noted  . Cellulitis and abscess 07/13/2015  . Abscess of lower back 07/02/2015    Past Surgical History:  Procedure Laterality Date  . CESAREAN SECTION    . TUBAL LIGATION    . tubiligation      Prior to Admission medications   Medication Sig Start Date End Date Taking? Authorizing Provider  aspirin EC 81 MG tablet Take 81 mg by mouth at bedtime.     [provider]  FLUoxetine (PROZAC) 10 MG tablet Take 10 mg by mouth daily.    [provider]  insulin glargine (LANTUS) 100 unit/mL SOPN Inject 50 Units into the skin at bedtime.    [provider]  lisinopril (PRINIVIL,ZESTRIL) 20 MG tablet Take 20 mg by mouth at bedtime.    [provider]  metFORMIN (GLUCOPHAGE-XR) 500 MG 24 hr tablet Take 1,000 mg by mouth daily with supper.    [provider]  metFORMIN (GLUCOPHAGE-XR) 750 MG 24 hr tablet Take 750 mg by mouth daily with supper.     [provider]  pravastatin (PRAVACHOL) 40 MG tablet Take 40 mg by mouth at bedtime.     [provider]  promethazine (PHENERGAN) 12.5 MG tablet Take 12.5 mg by mouth every 6 (six) hours as needed for nausea or vomiting.    [provider]  SUMAtriptan (IMITREX) 25 MG tablet Take 25 mg by mouth every 2 (two) hours as needed for migraine.    [provider]    No Known Allergies  Family History  Problem Relation Age of Onset  . Hyperlipidemia Mother     Social History Social History  Substance Use Topics  . Smoking status: Never Smoker  . Smokeless tobacco: Never Used  . Alcohol use No    Review of Systems Constitutional: Negative for fever. Cardiovascular: Negative for chest pain. Respiratory: Negative for shortness of breath. Gastrointestinal: mild upper abdominal pain. Positive for nausea and vomiting. Negative for diarrhea. Genitourinary: Negative for dysuria. Musculoskeletal: Negative for back pain. Neurological: Negative for headache All other ROS negative  ____________________________________________   PHYSICAL EXAM:  Constitutional: Alert and oriented. somewhat weak/somnolent appearing, but is able to answer questions and follow commands appropriately. Eyes: Normal exam ENT   Head: Normocephalic and atraumatic   Mouth/Throat: somewhat dry mucous membranes. Cardiovascular: Normal rate, regular rhythm. No murmur Respiratory: Normal respiratory effort without tachypnea nor retractions. Breath sounds are clear  Gastrointestinal: Soft and nontender. No distention.   Musculoskeletal: Nontender  with normal range of motion in all extremities.  Neurologic:  Normal speech and language. No gross focal neurologic deficits  Skin:  Skin is warm, dry and intact.  Psychiatric: Mood and affect are normal.   ____________________________________________     RADIOLOGY  normal chest  x-ray  ____________________________________________   INITIAL IMPRESSION / ASSESSMENT AND PLAN / ED COURSE  Pertinent labs & imaging results that were available during my care of the patient were reviewed by me and considered in my medical decision making (see chart for details).  patient presents to the emergency department for hyperglycemia, nausea and vomiting. At this time differential would include hyperglycemia, HHS, DKA, gastroenteritis, enteritis, biliary disease, pancreatitis. We will check labs including LFTs and lipase. We will check a VBG begin IV hydration and treat the patient's nausea.  the ABG most consistent with respiratory alkalosis. Patient does have a mild anion gap of 16. Blood glucose 321. We will continue the IV hydration, nausea medication, treat pain.  patient states she is feeling tired after the medications but is feeling much better. No longer nauseated. We will continue the IV hydration and repeat a VBG and BMP. Overall patient appears much improved from presentation.  salicylate level is normal. Chest x-ray is normal.  pending repeat VBG and BMP  Repeat labs show normalization of anion gap. VBG shows pH is 7.33. Patient states she feels normal. Patient is drinking without difficulty. Urinalysis does show urinary tract infection, urine culture has been added on and we will cover with Keflex. I discussed supportive care at home including plenty of non-sugary fluids and good diet control and insulin adherence. Patient is agreeable plan. I also discussed return precautions. Patient already has a follow-up appointment or morning with her doctor.  ____________________________________________   FINAL CLINICAL IMPRESSION(S) / ED DIAGNOSES  hyperglycemia Nausea vomiting UTI   Minna Antis, MD 01/26/17 2019

## 2017-01-26 NOTE — ED Notes (Signed)
Patient assisted on bedpan.  Unsuccessful attempt at this time.  Will retry at patient's request.  Family at bedside, and patient in NAD.

## 2017-01-28 ENCOUNTER — Encounter: Payer: Self-pay | Admitting: Emergency Medicine

## 2017-01-28 ENCOUNTER — Emergency Department: Payer: BLUE CROSS/BLUE SHIELD

## 2017-01-28 ENCOUNTER — Emergency Department
Admission: EM | Admit: 2017-01-28 | Discharge: 2017-01-28 | Disposition: A | Discharge status: Home / Self Care | Payer: BLUE CROSS/BLUE SHIELD | Attending: Emergency Medicine | Admitting: Emergency Medicine

## 2017-01-28 DIAGNOSIS — E119 Type 2 diabetes mellitus without complications: Secondary | ICD-10-CM | POA: Diagnosis not present

## 2017-01-28 DIAGNOSIS — R112 Nausea with vomiting, unspecified: Secondary | ICD-10-CM | POA: Diagnosis not present

## 2017-01-28 DIAGNOSIS — Z794 Long term (current) use of insulin: Secondary | ICD-10-CM | POA: Diagnosis not present

## 2017-01-28 DIAGNOSIS — Z7982 Long term (current) use of aspirin: Secondary | ICD-10-CM | POA: Diagnosis not present

## 2017-01-28 DIAGNOSIS — I10 Essential (primary) hypertension: Secondary | ICD-10-CM | POA: Diagnosis not present

## 2017-01-28 DIAGNOSIS — Z79899 Other long term (current) drug therapy: Secondary | ICD-10-CM | POA: Insufficient documentation

## 2017-01-28 DIAGNOSIS — G43C0 Periodic headache syndromes in child or adult, not intractable: Secondary | ICD-10-CM | POA: Diagnosis not present

## 2017-01-28 DIAGNOSIS — R51 Headache: Secondary | ICD-10-CM | POA: Diagnosis present

## 2017-01-28 HISTORY — DX: Migraine, unspecified, not intractable, without status migrainosus: G43.909

## 2017-01-28 LAB — URINALYSIS, COMPLETE (UACMP) WITH MICROSCOPIC
BILIRUBIN URINE: NEGATIVE
GLUCOSE, UA: 50 mg/dL — AB
KETONES UR: 5 mg/dL — AB
LEUKOCYTES UA: NEGATIVE
NITRITE: NEGATIVE
PH: 7 (ref 5.0–8.0)
Specific Gravity, Urine: 1.011 (ref 1.005–1.030)

## 2017-01-28 LAB — COMPREHENSIVE METABOLIC PANEL
ALK PHOS: 47 U/L (ref 38–126)
ALT: 14 U/L (ref 14–54)
ANION GAP: 10 (ref 5–15)
AST: 25 U/L (ref 15–41)
Albumin: 3 g/dL — ABNORMAL LOW (ref 3.5–5.0)
BILIRUBIN TOTAL: 0.5 mg/dL (ref 0.3–1.2)
BUN: 12 mg/dL (ref 6–20)
CALCIUM: 8.3 mg/dL — AB (ref 8.9–10.3)
CO2: 21 mmol/L — ABNORMAL LOW (ref 22–32)
Chloride: 108 mmol/L (ref 101–111)
Creatinine, Ser: 0.96 mg/dL (ref 0.44–1.00)
GFR calc non Af Amer: >60 mL/min (ref >60)
Glucose, Bld: 148 mg/dL — ABNORMAL HIGH (ref 65–99)
Potassium: 3.1 mmol/L — ABNORMAL LOW (ref 3.5–5.1)
SODIUM: 139 mmol/L (ref 135–145)
TOTAL PROTEIN: 7 g/dL (ref 6.5–8.1)

## 2017-01-28 LAB — CBC
HEMATOCRIT: 32.7 % — AB (ref 35.0–47.0)
Hemoglobin: 10.7 g/dL — ABNORMAL LOW (ref 12.0–16.0)
MCH: 26 pg (ref 26.0–34.0)
MCHC: 32.8 g/dL (ref 32.0–36.0)
MCV: 79.4 fL — ABNORMAL LOW (ref 80.0–100.0)
Platelets: 501 10*3/uL — ABNORMAL HIGH (ref 150–440)
RBC: 4.12 MIL/uL (ref 3.80–5.20)
RDW: 16.6 % — AB (ref 11.5–14.5)
WBC: 17 10*3/uL — AB (ref 3.6–11.0)

## 2017-01-28 LAB — LIPASE, BLOOD: LIPASE: 31 U/L (ref 11–51)

## 2017-01-28 LAB — POCT PREGNANCY, URINE: PREG TEST UR: NEGATIVE

## 2017-01-28 LAB — GLUCOSE, CAPILLARY: Glucose-Capillary: 139 mg/dL — ABNORMAL HIGH (ref 65–99)

## 2017-01-28 MED ORDER — ONDANSETRON HCL 4 MG/2ML IJ SOLN
4.0000 mg | Freq: Once | INTRAMUSCULAR | Status: AC | PRN
  Administered 2017-01-28: 4 mg via INTRAVENOUS

## 2017-01-28 MED ORDER — ONDANSETRON HCL 4 MG/2ML IJ SOLN
INTRAMUSCULAR | Status: AC
  Filled 2017-01-28: qty 2

## 2017-01-28 MED ORDER — DIPHENHYDRAMINE HCL 50 MG/ML IJ SOLN
25.0000 mg | Freq: Once | INTRAMUSCULAR | Status: AC
  Administered 2017-01-28: 25 mg via INTRAVENOUS
  Filled 2017-01-28: qty 1

## 2017-01-28 MED ORDER — PROCHLORPERAZINE EDISYLATE 5 MG/ML IJ SOLN
10.0000 mg | Freq: Once | INTRAMUSCULAR | Status: AC
  Administered 2017-01-28: 10 mg via INTRAVENOUS
  Filled 2017-01-28: qty 2

## 2017-01-28 MED ORDER — ONDANSETRON HCL 4 MG/2ML IJ SOLN
INTRAMUSCULAR | Status: AC
  Administered 2017-01-28: 4 mg via INTRAVENOUS
  Filled 2017-01-28: qty 2

## 2017-01-28 MED ORDER — MAGNESIUM SULFATE 4 GM/100ML IV SOLN
4.0000 g | Freq: Once | INTRAVENOUS | Status: AC
  Administered 2017-01-28: 4 g via INTRAVENOUS
  Filled 2017-01-28: qty 100

## 2017-01-28 MED ORDER — SODIUM CHLORIDE 0.9 % IV BOLUS (SEPSIS)
1000.0000 mL | Freq: Once | INTRAVENOUS | Status: AC
  Administered 2017-01-28: 1000 mL via INTRAVENOUS

## 2017-01-28 MED ORDER — SUMATRIPTAN SUCCINATE 50 MG PO TABS: 50.0000 mg | ORAL_TABLET | Freq: Once | ORAL | 0 refills | Status: DC | PRN

## 2017-01-28 MED ORDER — VALPROATE SODIUM 500 MG/5ML IV SOLN
500.0000 mg | Freq: Once | INTRAVENOUS | Status: AC
  Administered 2017-01-28: 500 mg via INTRAVENOUS
  Filled 2017-01-28: qty 5

## 2017-01-28 MED ORDER — FENTANYL CITRATE (PF) 100 MCG/2ML IJ SOLN
50.0000 ug | Freq: Once | INTRAMUSCULAR | Status: AC
  Administered 2017-01-28: 50 ug via INTRAVENOUS
  Filled 2017-01-28: qty 2

## 2017-01-28 MED ORDER — KETOROLAC TROMETHAMINE 30 MG/ML IJ SOLN
15.0000 mg | Freq: Once | INTRAMUSCULAR | Status: AC
  Administered 2017-01-28: 15 mg via INTRAVENOUS
  Filled 2017-01-28: qty 1

## 2017-01-28 MED ORDER — BUTALBITAL-APAP-CAFFEINE 50-325-40 MG PO TABS: 1.0000 | ORAL_TABLET | Freq: Four times a day (QID) | ORAL | 0 refills | Status: AC | PRN

## 2017-01-28 MED ORDER — BUTALBITAL-APAP-CAFFEINE 50-325-40 MG PO TABS
2.0000 | ORAL_TABLET | Freq: Once | ORAL | Status: AC
  Administered 2017-01-28: 2 via ORAL
  Filled 2017-01-28: qty 2

## 2017-01-28 MED ORDER — ONDANSETRON HCL 4 MG/2ML IJ SOLN
4.0000 mg | Freq: Once | INTRAMUSCULAR | Status: AC
  Administered 2017-01-28: 4 mg via INTRAVENOUS

## 2017-01-28 NOTE — ED Provider Notes (Signed)
[facility] Emergency Department Provider Note  ____________________________________________   First MD Initiated Contact with Patient 01/28/17 1101     (approximate)  I have reviewed the triage vital signs and the nursing notes.   HISTORY  Chief Complaint Headache and Emesis    HPI Vickie Montoya is a 41 y.o. female who comes to the emergency department with severe throbbing right-sided headache that began gradually at 8 AM this morning and is similar to previous migraine headaches although she has never had a migraine headache this intense. It is associated with nausea, vomiting, and photophobia. History is somewhat challenging to obtain on arrival as the patient is extremely uncomfortable appearing, retching, vomiting, and extremely uncomfortable.   Past Medical History:  Diagnosis Date  . Depression   . Diabetes mellitus without complication (HCC)   . Hypertension   . Migraine     Patient Active Problem List   Diagnosis Date Noted  . Cellulitis and abscess 07/13/2015  . Abscess of lower back 07/02/2015    Past Surgical History:  Procedure Laterality Date  . CESAREAN SECTION    . TUBAL LIGATION    . tubiligation      Prior to Admission medications   Medication Sig Start Date End Date Taking? Authorizing Provider  aspirin EC 81 MG tablet Take 81 mg by mouth at bedtime.     [provider]  butalbital-acetaminophen-caffeine (FIORICET, ESGIC) [id number] MG tablet Take 1-2 tablets by mouth every 6 (six) hours as needed for headache. 01/28/17 01/28/18  Merrily Brittle, MD  cephALEXin (KEFLEX) 500 MG capsule Take 1 capsule (500 mg total) by mouth 3 (three) times daily. 01/26/17   Minna Antis, MD  FLUoxetine (PROZAC) 10 MG tablet Take 10 mg by mouth daily.    [provider]  insulin detemir (LEVEMIR) 100 UNIT/ML injection Inject 49 Units into the skin at bedtime.    [provider]  lisinopril (PRINIVIL,ZESTRIL)  20 MG tablet Take 20 mg by mouth at bedtime.    [provider]  metFORMIN (GLUCOPHAGE-XR) 500 MG 24 hr tablet Take 1,000 mg by mouth daily with supper.    [provider]  pravastatin (PRAVACHOL) 40 MG tablet Take 40 mg by mouth at bedtime.     [provider]  promethazine (PHENERGAN) 12.5 MG tablet Take 12.5 mg by mouth every 6 (six) hours as needed for nausea or vomiting.    [provider]  propranolol ER (INDERAL LA) 60 MG 24 hr capsule Take 60 mg by mouth daily.    [provider]  SUMAtriptan (IMITREX) 25 MG tablet Take 25 mg by mouth every 2 (two) hours as needed for migraine.    [provider]  SUMAtriptan (IMITREX) 50 MG tablet Take 1 tablet (50 mg total) by mouth once as needed for migraine. May repeat in 2 hours if headache persists or recurs. 01/28/17 01/29/18  Merrily Brittle, MD    Allergies Patient has no known allergies.  Family History  Problem Relation Age of Onset  . Hyperlipidemia Mother     Social History Social History  Substance Use Topics  . Smoking status: Never Smoker  . Smokeless tobacco: Never Used  . Alcohol use No    Review of Systems Constitutional: No fever/chills Eyes: No visual changes. ENT: No sore throat. Cardiovascular: Denies chest pain. Respiratory: Denies shortness of breath. Gastrointestinal: No abdominal pain.  Positive for nausea, positive for vomiting.  No diarrhea.  No constipation. Genitourinary: Negative for dysuria. Musculoskeletal:  Negative for back pain. Skin: Negative for rash. Neurological: Positive for headaches   ____________________________________________   PHYSICAL EXAM:  VITAL SIGNS: ED Triage Vitals [01/28/17 1047]  Enc Vitals Group     BP (!) 219/98     Pulse Rate 95     Resp (!) 22     Temp      Temp src      SpO2 100 %     Weight 230 lb (104.3 kg)     Height  (1.676 m)     Head Circumference      Peak Flow      Pain Score 10     Pain Loc        Pain Edu?      Excl. in GC?     Constitutional: Appears miserable retching and vomiting and uncomfortable appearing Eyes: PERRL on the right eye 4 mm and brisk left eye has significant cataract Head: Atraumatic. Nose: No congestion/rhinnorhea. Mouth/Throat: No trismus Neck: No stridor.  No meningismus Cardiovascular: Normal rate, regular rhythm. Grossly normal heart sounds.  Good peripheral circulation. Respiratory: Normal respiratory effort.  No retractions. Lungs CTAB and moving good air Gastrointestinal: Soft nontender Musculoskeletal: No lower extremity edema   Neurologic:  . No gross focal neurologic deficits are appreciated. Skin:  Skin is warm, dry and intact. No rash noted. Psychiatric: Appears extremely uncomfortable    ____________________________________________   DIFFERENTIAL includes but not limited to  Migraine headache, subarachnoid hemorrhage, meningitis, encephalitis ____________________________________________   LABS (all labs ordered are listed, but only abnormal results are displayed)  Labs Reviewed  CBC - Abnormal; Notable for the following:       Result Value   WBC 17.0 (*)    Hemoglobin 10.7 (*)    HCT 32.7 (*)    MCV 79.4 (*)    RDW 16.6 (*)    Platelets 501 (*)    All other components within normal limits  URINALYSIS, COMPLETE (UACMP) WITH MICROSCOPIC - Abnormal; Notable for the following:    Color, Urine YELLOW (*)    APPearance CLEAR (*)    Glucose, UA 50 (*)    Hgb urine dipstick MODERATE (*)    Ketones, ur 5 (*)    Protein, ur >=300 (*)    Bacteria, UA RARE (*)    Squamous Epithelial / LPF 0-5 (*)    All other components within normal limits  GLUCOSE, CAPILLARY - Abnormal; Notable for the following:    Glucose-Capillary 139 (*)    All other components within normal limits  COMPREHENSIVE METABOLIC PANEL - Abnormal; Notable for the following:    Potassium 3.1 (*)    CO2 21 (*)    Glucose, Bld 148 (*)    Calcium 8.3 (*)     Albumin 3.0 (*)    All other components within normal limits  LIPASE, BLOOD  POCT PREGNANCY, URINE  POC URINE PREG, ED    blood work reviewed and interpreted by [employer] with no signs of infection. Blood work largely unremarkable aside from low albumin suggestive of poor nutrition status elevated white count is nonspecific and likely secondary to pain __________________________________________  EKG  Blood work reviewed and interpreted by me is largely unremarkable no evidence of urinary tract infection elevated white count is nonspecific and likely secondary to stress and pain ____________________________________________  RADIOLOGY  head CT reviewed by me no acute disease noted ____________________________________________   PROCEDURES  Procedure(s) performed: o  Procedures  Critical Care performed: no  Observation:  no ____________________________________________   INITIAL IMPRESSION / ASSESSMENT AND PLAN / ED COURSE  Pertinent labs & imaging results that were available during my care of the patient were reviewed by me and considered in my medical decision making (see chart for details).  The patient arrives miserable appearing with a slightly elevated respiratory rate and hypertensive.  the onset of her headache was roughly 3 hours prior to arrival. She does report that it is gradual onset, unilateral, and feels similar to previous migraines. Given the severity of the pain in that she is within the 6 hour window indicated by the [facility] paper uncomfortable obtaining a CT scan noncontrast and if negative she will be negative for subarachnoid hemorrhage.     ----------------------------------------- 12:57 PM on 01/28/2017 -----------------------------------------  Fortunately the patient's head CT is negative however she persists with significant pain. She is alert he received Toradol, prochlorperazine, and diphenhydramine. At this point I'll progress on to secondary  headache agents with Depakote, magnesium, as well as a low dose of fentanyl. ____________________________________________   The patient feels markedly improved after her cocktails medications however not completely so. Given reassurance and she remains neurologically intact. This point is medically stable for outpatient management with a trial of Fioricet and Imitrex. She verbalizes understanding and agreement with the plan.  FINAL CLINICAL IMPRESSION(S) / ED DIAGNOSES  Final diagnoses:  Periodic headache syndrome, not intractable      NEW MEDICATIONS STARTED DURING THIS VISIT:  Discharge Medication List as of 01/28/2017  2:58 PM    START taking these medications   Details  butalbital-acetaminophen-caffeine (FIORICET, ESGIC) 50-325-40 MG tablet Take 1-2 tablets by mouth every 6 (six) hours as needed for headache., Starting Sat 01/28/2017, Until Sun 01/28/2018, Print    !! SUMAtriptan (IMITREX) 50 MG tablet Take 1 tablet (50 mg total) by mouth once as needed for migraine. May repeat in 2 hours if headache persists or recurs., Starting Sat 01/28/2017, Until Mon 01/29/2018, Print     !! - Potential duplicate medications found. Please discuss with provider.       Note:  This document was prepared using Dragon voice recognition software and may include unintentional dictation errors.     Merrily Brittle, MD 01/28/17 1535

## 2017-01-28 NOTE — ED Notes (Signed)
EDP at bedside  

## 2017-01-28 NOTE — Discharge Instructions (Signed)
Please make sure you remain well-hydrated and add over-the-counter magnesium to your regimen twice a day to help prevent migraine headaches in the future. Make an appointment to follow-up with her primary care provider next week for recheck. Return to the emergency department sooner for any concerns.  It was a pleasure to take care of you today, and thank you for coming to our emergency department.  If you have any questions or concerns before leaving please ask the nurse to grab me and I'm more than happy to go through your aftercare instructions again.  If you were prescribed any opioid pain medication today such as Norco, Vicodin, Percocet, morphine, hydrocodone, or oxycodone please make sure you do not drive when you are taking this medication as it can alter your ability to drive safely.  If you have any concerns once you are home that you are not improving or are in fact getting worse before you can make it to your follow-up appointment, please do not hesitate to call 911 and come back for further evaluation.  Merrily Brittle, MD  Results for orders placed or performed during the hospital encounter of 01/28/17  CBC  Result Value Ref Range   WBC 17.0 (H) 3.6 - 11.0 K/uL   RBC 4.12 3.80 - 5.20 MIL/uL   Hemoglobin 10.7 (L) 12.0 - 16.0 g/dL   HCT 16.1 (L) 09.6 - 04.5 %   MCV 79.4 (L) 80.0 - 100.0 fL   MCH 26.0 26.0 - 34.0 pg   MCHC 32.8 32.0 - 36.0 g/dL   RDW 40.9 (H) 81.1 - 91.4 %   Platelets 501 (H) 150 - 440 K/uL  Urinalysis, Complete w Microscopic  Result Value Ref Range   Color, Urine YELLOW (A) YELLOW   APPearance CLEAR (A) CLEAR   Specific Gravity, Urine 1.011 1.005 - 1.030   pH 7.0 5.0 - 8.0   Glucose, UA 50 (A) NEGATIVE mg/dL   Hgb urine dipstick MODERATE (A) NEGATIVE   Bilirubin Urine NEGATIVE NEGATIVE   Ketones, ur 5 (A) NEGATIVE mg/dL   Protein, ur >=782 (A) NEGATIVE mg/dL   Nitrite NEGATIVE NEGATIVE   Leukocytes, UA NEGATIVE NEGATIVE   RBC / HPF TOO NUMEROUS TO COUNT  0 - 5 RBC/hpf   WBC, UA 0-5 0 - 5 WBC/hpf   Bacteria, UA RARE (A) NONE SEEN   Squamous Epithelial / LPF 0-5 (A) NONE SEEN  Glucose, capillary  Result Value Ref Range   Glucose-Capillary 139 (H) 65 - 99 mg/dL  Comprehensive metabolic panel  Result Value Ref Range   Sodium 139 135 - 145 mmol/L   Potassium 3.1 (L) 3.5 - 5.1 mmol/L   Chloride 108 101 - 111 mmol/L   CO2 21 (L) 22 - 32 mmol/L   Glucose, Bld 148 (H) 65 - 99 mg/dL   BUN 12 6 - 20 mg/dL   Creatinine, Ser 9.56 0.44 - 1.00 mg/dL   Calcium 8.3 (L) 8.9 - 10.3 mg/dL   Total Protein 7.0 6.5 - 8.1 g/dL   Albumin 3.0 (L) 3.5 - 5.0 g/dL   AST 25 15 - 41 U/L   ALT 14 14 - 54 U/L   Alkaline Phosphatase 47 38 - 126 U/L   Total Bilirubin 0.5 0.3 - 1.2 mg/dL   GFR calc non Af Amer >60 >60 mL/min   GFR calc Af Amer >60 >60 mL/min   Anion gap 10 5 - 15  Lipase, blood  Result Value Ref Range   Lipase 31 11 -  51 U/L  Pregnancy, urine POC  Result Value Ref Range   Preg Test, Ur NEGATIVE NEGATIVE   Ct Head Wo Contrast  Result Date: 01/28/2017 CLINICAL DATA:  Worst headache of life.  History of migraines. EXAM: CT HEAD WITHOUT CONTRAST TECHNIQUE: Contiguous axial images were obtained from the base of the skull through the vertex without intravenous contrast. COMPARISON:  05/07/2016 FINDINGS: Brain: Gray-white differentiation is maintained. No CT evidence of acute large territory infarct. No intraparenchymal or extra-axial mass or hemorrhage. Normal size and configuration of the ventricles and the basilar cisterns. No midline shift. Vascular: No hyperdense vessel or unexpected calcification. Skull: No displaced calvarial fracture. Sinuses/Orbits: There is underpneumatization of the right frontal sinus. The remaining paranasal sinuses and mastoid air cells are normally aerated. Other: Regional soft tissues appear normal. IMPRESSION: Negative noncontrast head CT. Electronically Signed   By: Simonne Come M.D.   On: 01/28/2017 13:04   Dg Chest  Portable 1 View  Result Date: 01/26/2017 CLINICAL DATA:  Tachypnea EXAM: PORTABLE CHEST 1 VIEW COMPARISON:  Chest radiograph 04/30/2014 FINDINGS: The heart size and mediastinal contours are within normal limits. Both lungs are clear. The visualized skeletal structures are unremarkable. IMPRESSION: No active disease. Electronically Signed   By: Deatra Robinson M.D.   On: 01/26/2017 17:12

## 2017-01-28 NOTE — ED Triage Notes (Signed)
Patient to ER for c/o severe headache. States this headache is worst headache she has ever had. Has h/o migraines with similar type of pain, but "never this bad". Patient constantly vomiting in triage. Patient was seen two days ago in ER for hyperglycemia.

## 2017-01-28 NOTE — ED Notes (Signed)
EDP at bedside for update 

## 2017-01-29 LAB — URINE CULTURE: Culture: >=100000 — AB

## 2017-01-30 LAB — BLOOD GAS, VENOUS
Acid-base deficit: 2.9 mmol/L — ABNORMAL HIGH (ref 0.0–2.0)
Acid-base deficit: 5.5 mmol/L — ABNORMAL HIGH (ref 0.0–2.0)
BICARBONATE: 18 mmol/L — AB (ref 20.0–28.0)
BICARBONATE: 20 mmol/L (ref 20.0–28.0)
FIO2: 0.21
O2 SAT: UNDETERMINED %
O2 SAT: UNDETERMINED %
PATIENT TEMPERATURE: 37
PO2 VEN: UNDETERMINED mmHg (ref 32.0–45.0)
Patient temperature: 37
pCO2, Ven: 21 mmHg — ABNORMAL LOW (ref 44.0–60.0)
pCO2, Ven: 38 mmHg — ABNORMAL LOW (ref 44.0–60.0)
pH, Ven: 7.54 — ABNORMAL HIGH (ref 7.250–7.430)
pH, Ven: 7.33 (ref 7.250–7.430)
pO2, Ven: UNDETERMINED mmHg (ref 32.0–45.0)

## 2017-02-01 ENCOUNTER — Emergency Department (HOSPITAL_COMMUNITY)
Admission: EM | Admit: 2017-02-01 | Discharge: 2017-02-01 | Disposition: A | Discharge status: Home / Self Care | Payer: BLUE CROSS/BLUE SHIELD | Attending: Emergency Medicine | Admitting: Emergency Medicine

## 2017-02-01 ENCOUNTER — Encounter (HOSPITAL_COMMUNITY): Payer: Self-pay | Admitting: Emergency Medicine

## 2017-02-01 DIAGNOSIS — I10 Essential (primary) hypertension: Secondary | ICD-10-CM | POA: Diagnosis not present

## 2017-02-01 DIAGNOSIS — Z79899 Other long term (current) drug therapy: Secondary | ICD-10-CM | POA: Diagnosis not present

## 2017-02-01 DIAGNOSIS — R519 Headache, unspecified: Secondary | ICD-10-CM

## 2017-02-01 DIAGNOSIS — Z794 Long term (current) use of insulin: Secondary | ICD-10-CM | POA: Insufficient documentation

## 2017-02-01 DIAGNOSIS — E119 Type 2 diabetes mellitus without complications: Secondary | ICD-10-CM | POA: Diagnosis not present

## 2017-02-01 DIAGNOSIS — Z7982 Long term (current) use of aspirin: Secondary | ICD-10-CM | POA: Diagnosis not present

## 2017-02-01 DIAGNOSIS — R51 Headache: Secondary | ICD-10-CM | POA: Diagnosis present

## 2017-02-01 LAB — I-STAT CHEM 8, ED
BUN: 16 mg/dL (ref 6–20)
CHLORIDE: 100 mmol/L — AB (ref 101–111)
CREATININE: 1.1 mg/dL — AB (ref 0.44–1.00)
Calcium, Ion: 1.05 mmol/L — ABNORMAL LOW (ref 1.15–1.40)
GLUCOSE: 137 mg/dL — AB (ref 65–99)
HCT: 29 % — ABNORMAL LOW (ref 36.0–46.0)
Hemoglobin: 9.9 g/dL — ABNORMAL LOW (ref 12.0–15.0)
POTASSIUM: 3 mmol/L — AB (ref 3.5–5.1)
Sodium: 138 mmol/L (ref 135–145)
TCO2: 27 mmol/L (ref 22–32)

## 2017-02-01 LAB — CBG MONITORING, ED: GLUCOSE-CAPILLARY: 151 mg/dL — AB (ref 65–99)

## 2017-02-01 LAB — I-STAT BETA HCG BLOOD, ED (MC, WL, AP ONLY): I-stat hCG, quantitative: <5 m[IU]/mL (ref <5)

## 2017-02-01 MED ORDER — PROCHLORPERAZINE EDISYLATE 5 MG/ML IJ SOLN
10.0000 mg | Freq: Once | INTRAMUSCULAR | Status: AC
  Administered 2017-02-01: 10 mg via INTRAVENOUS
  Filled 2017-02-01: qty 2

## 2017-02-01 MED ORDER — DIPHENHYDRAMINE HCL 50 MG/ML IJ SOLN
12.5000 mg | Freq: Once | INTRAMUSCULAR | Status: AC
  Administered 2017-02-01: 12.5 mg via INTRAVENOUS
  Filled 2017-02-01: qty 1

## 2017-02-01 MED ORDER — SODIUM CHLORIDE 0.9 % IV BOLUS (SEPSIS)
1000.0000 mL | Freq: Once | INTRAVENOUS | Status: AC
  Administered 2017-02-01: 1000 mL via INTRAVENOUS

## 2017-02-01 MED ORDER — PROCHLORPERAZINE MALEATE 10 MG PO TABS: 10.0000 mg | ORAL_TABLET | Freq: Two times a day (BID) | ORAL | 0 refills | Status: DC | PRN

## 2017-02-01 MED ORDER — MAGNESIUM SULFATE 2 GM/50ML IV SOLN
2.0000 g | Freq: Once | INTRAVENOUS | Status: AC
  Administered 2017-02-01: 2 g via INTRAVENOUS
  Filled 2017-02-01: qty 50

## 2017-02-01 NOTE — ED Provider Notes (Signed)
[facility]-EMERGENCY DEPT Provider Note   CSN: [id number] Arrival date & time: 02/01/17  1115     History   Chief Complaint Chief Complaint  Patient presents with  . Headache   HPI   Blood pressure (!) 168/79, pulse (!) 58, temperature 98.8 F (37.1 C), temperature source Oral, resp. rate 19, height  (1.676 m), last menstrual period 01/02/2017, SpO2 94 %.  Vickie Montoya is a 41 y.o. female complaining of acute severe left periorbital radiating to the right forehead headache this morning upon waking, she states is consistent with prior headache exacerbations over the course of the last year. She was six-hour window within the last week which was reassuring. She states that the headache is similar to prior. She's been taking Fioricet and Imitrex with little relief. 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 She denies headache exacerbation with Valsalva. She states that she needs a definitive diagnosis as to what is causing these headaches today. Has appointment with neurology in 2 weeks.   Past Medical History:  Diagnosis Date  . Depression   . Diabetes mellitus without complication (HCC)   . Hypertension   . Migraine     Patient Active Problem List   Diagnosis Date Noted  . Cellulitis and abscess 07/13/2015  . Abscess of lower back 07/02/2015    Past Surgical History:  Procedure Laterality Date  . CESAREAN SECTION    . TUBAL LIGATION    . tubiligation      OB History    No data available       Home Medications    Prior to Admission medications   Medication Sig Start Date End Date Taking? Authorizing Provider  aspirin EC 81 MG tablet Take 81 mg by mouth at bedtime.    Yes [provider]  cephALEXin (KEFLEX) 500 MG capsule Take 1 capsule (500 mg total) by mouth 3 (three) times daily. 01/26/17  Yes Minna Antis, MD  FLUoxetine (PROZAC) 10 MG tablet  Take 10 mg by mouth daily.   Yes [provider]  insulin detemir (LEVEMIR) 100 UNIT/ML injection Inject 49 Units into the skin at bedtime.   Yes [provider]  lisinopril (PRINIVIL,ZESTRIL) 20 MG tablet Take 20 mg by mouth at bedtime.   Yes [provider]  metFORMIN (GLUCOPHAGE-XR) 500 MG 24 hr tablet Take 1,000 mg by mouth daily with supper.   Yes [provider]  pravastatin (PRAVACHOL) 40 MG tablet Take 40 mg by mouth at bedtime.    Yes [provider]  promethazine (PHENERGAN) 12.5 MG tablet Take 12.5 mg by mouth every 6 (six) hours as needed for nausea or vomiting.   Yes [provider]  propranolol ER (INDERAL LA) 60 MG 24 hr capsule Take 60 mg by mouth daily.   Yes [provider]  SUMAtriptan (IMITREX) 50 MG tablet Take 1 tablet (50 mg total) by mouth once as needed for migraine. May repeat in 2 hours if headache persists or recurs. 01/28/17 01/29/18 Yes Merrily Brittle, MD  butalbital-acetaminophen-caffeine (FIORICET, ESGIC) [phone] MG tablet Take 1-2 tablets by mouth every 6 (six) hours as needed for headache. 01/28/17 01/28/18  Merrily Brittle, MD  prochlorperazine (COMPAZINE) 10 MG tablet Take 1 tablet (10 mg total) by mouth 2 (two) times daily as needed for nausea or vomiting (Nausea ). 02/01/17   Xander Jutras, Mardella Layman    Family History Family History  Problem Relation Age of Onset  . Hyperlipidemia Mother     Social History Social History  Substance Use Topics  . Smoking status: Never Smoker  . Smokeless tobacco: Never Used  . Alcohol use No     Allergies   Patient has no known allergies.   Review of Systems Review of Systems  A complete review of systems was obtained and all systems are negative except as noted in the HPI and PMH.   Physical Exam Updated Vital Signs BP (!) 183/76   Pulse 62   Temp 98.8 F (37.1 C) (Oral)   Resp 15   Ht  (1.676 m)   LMP 01/02/2017 (Approximate) Comment:  neg preg test 01/28/17   SpO2 99%   Physical Exam  Constitutional: She is oriented to person, place, and time. She appears well-developed and well-nourished.  HENT:  Head: Normocephalic and atraumatic.  Mouth/Throat: Oropharynx is clear and moist.  Eyes: Pupils are equal, round, and reactive to light. Conjunctivae and EOM are normal.  No TTP of maxillary or frontal sinuses  No TTP or induration of temporal arteries bilaterally  Neck: Normal range of motion. Neck supple.  FROM to C-spine. Pt can touch chin to chest without discomfort. No TTP of midline cervical spine.   Cardiovascular: Normal rate, regular rhythm and intact distal pulses.   Pulmonary/Chest: Effort normal and breath sounds normal. No respiratory distress. She has no wheezes. She has no rales. She exhibits no tenderness.  Abdominal: Soft. Bowel sounds are normal. There is no tenderness.  Musculoskeletal: Normal range of motion. She exhibits no edema or tenderness.  Neurological: She is alert and oriented to person, place, and time. No cranial nerve deficit.  II-Visual fields grossly intact. III/IV/VI-Extraocular movements intact.  Pupils reactive bilaterally. V/VII-Smile symmetric, equal eyebrow raise,  facial sensation intact VIII- Hearing grossly intact IX/X-Normal gag XI-bilateral shoulder shrug XII-midline tongue extension Motor: 5/5 bilaterally with normal tone and bulk Cerebellar: Normal finger-to-nose  and normal heel-to-shin test.   Romberg negative Ambulates with a coordinated gait   Nursing note and vitals reviewed.    ED Treatments / Results  Labs (all labs ordered are listed, but only abnormal results are displayed) Labs Reviewed  CBG MONITORING, ED - Abnormal; Notable for the following:       Result Value   Glucose-Capillary 151 (*)    All other components within normal limits  I-STAT CHEM 8, ED - Abnormal; Notable for the following:    Potassium 3.0 (*)    Chloride 100 (*)    Creatinine, Ser  1.10 (*)    Glucose, Bld 137 (*)    Calcium, Ion 1.05 (*)    Hemoglobin 9.9 (*)    HCT 29.0 (*)    All other components within normal limits  I-STAT BETA HCG BLOOD, ED ([facility], [facility], [facility] ONLY)  I-STAT CHEM 8, ED    EKG  EKG Interpretation  Date/Time:  Wednesday February 01 2017 11:17:22 EDT Ventricular Rate:  57 PR Interval:    QRS Duration: 101 QT Interval:  465 QTC Calculation: 453 R Axis:   56 Text Interpretation:  Sinus rhythm since last tracing no significant change Confirmed by Rolan Bucco [phone]) on 02/01/2017 11:20:31 AM       Radiology No results found.  Procedures Procedures (including critical care time)  Medications Ordered in ED Medications  sodium chloride 0.9 % bolus 1,000 mL (0 mLs Intravenous Stopped 02/01/17 1442)  magnesium sulfate IVPB 2 g 50 mL (0 g Intravenous  Stopped 02/01/17 1321)  prochlorperazine (COMPAZINE) injection 10 mg (10 mg Intravenous Given 02/01/17 1207)  diphenhydrAMINE (BENADRYL) injection 12.5 mg (12.5 mg Intravenous Given 02/01/17 1207)     Initial Impression / Assessment and Plan / ED Course  I have reviewed the triage vital signs and the nursing notes.  Pertinent labs & imaging results that were available during my care of the patient were reviewed by me and considered in my medical decision making (see chart for details).     Vitals:   02/01/17 1145 02/01/17 1230 02/01/17 1300 02/01/17 1437  BP: (!) 179/80 (!) 197/99 (!) 185/93 (!) 183/76  Pulse: (!) 58 62 61 62  Resp: Temp:      TempSrc:      SpO2: 97% 97% 95% 99%  Height:        Medications  sodium chloride 0.9 % bolus 1,000 mL (0 mLs Intravenous Stopped 02/01/17 1442)  magnesium sulfate IVPB 2 g 50 mL (0 g Intravenous Stopped 02/01/17 1321)  prochlorperazine (COMPAZINE) injection 10 mg (10 mg Intravenous Given 02/01/17 1207)  diphenhydrAMINE (BENADRYL) injection 12.5 mg (12.5 mg Intravenous Given 02/01/17 1207)    NIEMA CARRARA is 41 y.o. female presenting  with HA. Presentation is like pts typical HA, Recent CT with no abnormalityand non concerning for [facility], ICH, Meningitis, or temporal arteritis. Pt is afebrile with no focal neuro deficits, nuchal rigidity, or change in vision. Pt is to follow up with PCP to discuss prophylactic medication. Pt verbalizes understanding and is agreeable with plan to dc.  Evaluation does not show pathology that would require ongoing emergent intervention or inpatient treatment. Pt is hemodynamically stable and mentating appropriately. Discussed findings and plan with patient/guardian, who agrees with care plan. All questions answered. Return precautions discussed and outpatient follow up given.      Final Clinical Impressions(s) / ED Diagnoses   Final diagnoses:  Nonintractable headache, unspecified chronicity pattern, unspecified headache type    New Prescriptions Discharge Medication List as of 02/01/2017  2:24 PM    START taking these medications   Details  prochlorperazine (COMPAZINE) 10 MG tablet Take 1 tablet (10 mg total) by mouth 2 (two) times daily as needed for nausea or vomiting (Nausea )., Starting Wed 02/01/2017, Print         Alexiz Sustaita, [place], PA-C 02/01/17 1517    Rolan Bucco, MD 02/02/17 [phone]

## 2017-02-01 NOTE — Discharge Instructions (Signed)
Please follow with your primary care doctor in the next 2 days for a check-up. They must obtain records for further management.  ° °Do not hesitate to return to the Emergency Department for any new, worsening or concerning symptoms.  ° °

## 2017-02-01 NOTE — ED Triage Notes (Signed)
Pt here from home with c/o h/a and nausea , pt has history of migraines and a possible aneurysm , pt received  of Zofran and 50 of fentanyl

## 2017-02-02 LAB — I-STAT CHEM 8, ED
BUN: 14 mg/dL (ref 6–20)
CHLORIDE: 100 mmol/L — AB (ref 101–111)
CREATININE: 1.1 mg/dL — AB (ref 0.44–1.00)
Calcium, Ion: 1.05 mmol/L — ABNORMAL LOW (ref 1.15–1.40)
GLUCOSE: 146 mg/dL — AB (ref 65–99)
HEMATOCRIT: 28 % — AB (ref 36.0–46.0)
HEMOGLOBIN: 9.5 g/dL — AB (ref 12.0–15.0)
POTASSIUM: 3 mmol/L — AB (ref 3.5–5.1)
Sodium: 138 mmol/L (ref 135–145)
TCO2: 27 mmol/L (ref 22–32)

## 2017-02-08 DIAGNOSIS — G44221 Chronic tension-type headache, intractable: Secondary | ICD-10-CM | POA: Insufficient documentation

## 2017-02-16 DIAGNOSIS — H4310 Vitreous hemorrhage, unspecified eye: Secondary | ICD-10-CM | POA: Insufficient documentation

## 2017-02-16 DIAGNOSIS — R4182 Altered mental status, unspecified: Secondary | ICD-10-CM | POA: Insufficient documentation

## 2017-02-16 DIAGNOSIS — N179 Acute kidney failure, unspecified: Secondary | ICD-10-CM | POA: Insufficient documentation

## 2017-02-16 DIAGNOSIS — H547 Unspecified visual loss: Secondary | ICD-10-CM | POA: Insufficient documentation

## 2017-02-16 DIAGNOSIS — D649 Anemia, unspecified: Secondary | ICD-10-CM | POA: Insufficient documentation

## 2017-06-06 ENCOUNTER — Other Ambulatory Visit: Payer: Self-pay | Admitting: Medical Oncology

## 2017-06-06 ENCOUNTER — Ambulatory Visit
Admission: RE | Admit: 2017-06-06 | Discharge: 2017-06-06 | Disposition: A | Discharge status: Home / Self Care | Payer: BLUE CROSS/BLUE SHIELD | Source: Ambulatory Visit | Attending: Medical Oncology | Admitting: Medical Oncology

## 2017-06-06 DIAGNOSIS — R05 Cough: Secondary | ICD-10-CM

## 2017-06-06 DIAGNOSIS — H547 Unspecified visual loss: Secondary | ICD-10-CM | POA: Insufficient documentation

## 2017-06-06 DIAGNOSIS — N184 Chronic kidney disease, stage 4 (severe): Secondary | ICD-10-CM | POA: Insufficient documentation

## 2017-06-06 DIAGNOSIS — R053 Chronic cough: Secondary | ICD-10-CM

## 2017-06-06 DIAGNOSIS — N183 Chronic kidney disease, stage 3 unspecified: Secondary | ICD-10-CM | POA: Insufficient documentation

## 2018-05-23 ENCOUNTER — Emergency Department
Admission: EM | Admit: 2018-05-23 | Discharge: 2018-05-23 | Disposition: A | Discharge status: Home / Self Care | Payer: BLUE CROSS/BLUE SHIELD | Attending: Emergency Medicine | Admitting: Emergency Medicine

## 2018-05-23 ENCOUNTER — Emergency Department: Payer: BLUE CROSS/BLUE SHIELD

## 2018-05-23 DIAGNOSIS — E119 Type 2 diabetes mellitus without complications: Secondary | ICD-10-CM | POA: Diagnosis not present

## 2018-05-23 DIAGNOSIS — R0602 Shortness of breath: Secondary | ICD-10-CM

## 2018-05-23 DIAGNOSIS — J984 Other disorders of lung: Secondary | ICD-10-CM

## 2018-05-23 DIAGNOSIS — I10 Essential (primary) hypertension: Secondary | ICD-10-CM | POA: Diagnosis not present

## 2018-05-23 DIAGNOSIS — R079 Chest pain, unspecified: Secondary | ICD-10-CM | POA: Insufficient documentation

## 2018-05-23 LAB — BASIC METABOLIC PANEL
ANION GAP: 11 (ref 5–15)
BUN: 27 mg/dL — AB (ref 6–20)
CHLORIDE: 105 mmol/L (ref 98–111)
CO2: 21 mmol/L — ABNORMAL LOW (ref 22–32)
Calcium: 9.8 mg/dL (ref 8.9–10.3)
Creatinine, Ser: 1.64 mg/dL — ABNORMAL HIGH (ref 0.44–1.00)
GFR calc non Af Amer: 38 mL/min — ABNORMAL LOW (ref >60)
GFR, EST AFRICAN AMERICAN: 44 mL/min — AB (ref >60)
Glucose, Bld: 255 mg/dL — ABNORMAL HIGH (ref 70–99)
POTASSIUM: 4.9 mmol/L (ref 3.5–5.1)
SODIUM: 137 mmol/L (ref 135–145)

## 2018-05-23 LAB — CBC WITH DIFFERENTIAL/PLATELET
ABS IMMATURE GRANULOCYTES: 0.03 10*3/uL (ref 0.00–0.07)
BASOS ABS: 0.1 10*3/uL (ref 0.0–0.1)
BASOS PCT: 0 %
Eosinophils Absolute: 0.2 10*3/uL (ref 0.0–0.5)
Eosinophils Relative: 2 %
HCT: 29.5 % — ABNORMAL LOW (ref 36.0–46.0)
HEMOGLOBIN: 9 g/dL — AB (ref 12.0–15.0)
IMMATURE GRANULOCYTES: 0 %
Lymphocytes Relative: 19 %
Lymphs Abs: 2.3 10*3/uL (ref 0.7–4.0)
MCH: 23.7 pg — AB (ref 26.0–34.0)
MCHC: 30.5 g/dL (ref 30.0–36.0)
MCV: 77.8 fL — ABNORMAL LOW (ref 80.0–100.0)
Monocytes Absolute: 0.9 10*3/uL (ref 0.1–1.0)
Monocytes Relative: 7 %
NEUTROS ABS: 8.8 10*3/uL — AB (ref 1.7–7.7)
NEUTROS PCT: 72 %
PLATELETS: 480 10*3/uL — AB (ref 150–400)
RBC: 3.79 MIL/uL — AB (ref 3.87–5.11)
RDW: 16.7 % — ABNORMAL HIGH (ref 11.5–15.5)
WBC: 12.2 10*3/uL — AB (ref 4.0–10.5)
nRBC: 0 % (ref 0.0–0.2)

## 2018-05-23 LAB — INFLUENZA PANEL BY PCR (TYPE A & B)
INFLBPCR: NEGATIVE
Influenza A By PCR: NEGATIVE

## 2018-05-23 LAB — FIBRIN DERIVATIVES D-DIMER (ARMC ONLY): Fibrin derivatives D-dimer (ARMC): 670.06 ng/mL (FEU) — ABNORMAL HIGH (ref 0.00–499.00)

## 2018-05-23 LAB — TROPONIN I

## 2018-05-23 LAB — BRAIN NATRIURETIC PEPTIDE: B NATRIURETIC PEPTIDE 5: 109 pg/mL — AB (ref 0.0–100.0)

## 2018-05-23 MED ORDER — AZITHROMYCIN 250 MG PO TABS: ORAL_TABLET | ORAL | 0 refills | Status: DC

## 2018-05-23 MED ORDER — PIPERACILLIN-TAZOBACTAM 3.375 G IVPB 30 MIN: 3.3750 g | Freq: Once | INTRAVENOUS | Status: DC

## 2018-05-23 MED ORDER — HYDROCOD POLST-CPM POLST ER 10-8 MG/5ML PO SUER: 5.0000 mL | Freq: Two times a day (BID) | ORAL | 0 refills | Status: DC

## 2018-05-23 MED ORDER — LORAZEPAM 2 MG/ML IJ SOLN
0.5000 mg | Freq: Once | INTRAMUSCULAR | Status: AC
  Administered 2018-05-23: 0.5 mg via INTRAVENOUS
  Filled 2018-05-23: qty 1

## 2018-05-23 MED ORDER — SODIUM CHLORIDE 0.9 % IV SOLN
Freq: Once | INTRAVENOUS | Status: AC
  Administered 2018-05-23: 22:00:00 via INTRAVENOUS

## 2018-05-23 MED ORDER — KETOROLAC TROMETHAMINE 30 MG/ML IJ SOLN
30.0000 mg | Freq: Once | INTRAMUSCULAR | Status: AC
  Administered 2018-05-23: 30 mg via INTRAVENOUS
  Filled 2018-05-23: qty 1

## 2018-05-23 MED ORDER — VANCOMYCIN HCL IN DEXTROSE 1-5 GM/200ML-% IV SOLN: 1000.0000 mg | Freq: Once | INTRAVENOUS | Status: DC

## 2018-05-23 MED ORDER — IOHEXOL 350 MG/ML SOLN
75.0000 mL | Freq: Once | INTRAVENOUS | Status: AC | PRN
  Administered 2018-05-23: 60 mL via INTRAVENOUS

## 2018-05-23 NOTE — ED Provider Notes (Signed)
[facility] Emergency Department Provider Note       Time seen: ----------------------------------------- 7:49 PM on 05/23/2018 -----------------------------------------   I have reviewed the triage vital signs and the nursing notes.  HISTORY   Chief Complaint Shortness of Breath and Chest Pain   HPI Vickie Montoya is a 43 y.o. female with a history of depression, diabetes, hypertension, migraines who presents to the ED for shortness of breath with right-sided rib pain for the past few days.  Patient has had upper respiratory symptoms she states, has had fever and chills as well.  She denies vomiting or diarrhea.  Past Medical History:  Diagnosis Date  . Depression   . Diabetes mellitus without complication (HCC)   . Hypertension   . Migraine     Patient Active Problem List   Diagnosis Date Noted  . Cellulitis and abscess 07/13/2015  . Abscess of lower back 07/02/2015    Past Surgical History:  Procedure Laterality Date  . CESAREAN SECTION    . TUBAL LIGATION    . tubiligation      Allergies Patient has no known allergies.  Social History Social History   Tobacco Use  . Smoking status: Never Smoker  . Smokeless tobacco: Never Used  Substance Use Topics  . Alcohol use: No  . Drug use: Yes    Types: Marijuana   Review of Systems Constitutional: Positive for fever and chills Cardiovascular: Positive for chest pain Respiratory: Positive for shortness of breath and cough Gastrointestinal: Negative for abdominal pain, vomiting and diarrhea. Musculoskeletal: Negative for back pain. Skin: Negative for rash. Neurological: Negative for headaches, focal weakness or numbness.  All systems negative/normal/unremarkable except as stated in the HPI  ____________________________________________   PHYSICAL EXAM:  VITAL SIGNS: ED Triage Vitals  Enc Vitals Group     BP 05/23/18 1925 (!) 161/74     Pulse Rate 05/23/18 1925 77     Resp  05/23/18 1925 (!) 21     Temp 05/23/18 1925 98.3 F (36.8 C)     Temp src --      SpO2 05/23/18 1925 100 %     Weight 05/23/18 1929 272 lb (123.4 kg)     Height 05/23/18 1929 5\' 7"  (1.702 m)     Head Circumference --      Peak Flow --      Pain Score 05/23/18 1928 7     Pain Loc --      Pain Edu? --      Excl. in [place]? --    Constitutional: Alert and oriented.  No acute distress Eyes: Conjunctivae are normal. Normal extraocular movements. ENT      Head: Normocephalic and atraumatic.      Nose: No congestion/rhinnorhea.      Mouth/Throat: Mucous membranes are moist.      Neck: No stridor. Cardiovascular: Normal rate, regular rhythm. No murmurs, rubs, or gallops. Respiratory: Tachypnea with clear breath sounds Gastrointestinal: Soft and nontender. Normal bowel sounds Musculoskeletal: Nontender with normal range of motion in extremities. No lower extremity tenderness nor edema. Neurologic:  Normal speech and language. No gross focal neurologic deficits are appreciated.  Skin:  Skin is warm, dry and intact. No rash noted. Psychiatric: Anxious mood and affect ____________________________________________  EKG: Interpreted by me.  Sinus rhythm the rate of 79 bpm, low voltage, normal axis, normal QT  ____________________________________________  ED COURSE:  As part of my medical decision making, I reviewed the following data within the electronic medical  record:  History obtained from family if available, nursing notes, old chart and ekg, as well as notes from prior ED visits. Patient presented for shortness of breath, we will assess with labs and imaging as indicated at this time.   Procedures ____________________________________________   LABS (pertinent positives/negatives)  Labs Reviewed  CBC WITH DIFFERENTIAL/PLATELET - Abnormal; Notable for the following components:      Result Value   WBC 12.2 (*)    RBC 3.79 (*)    Hemoglobin 9.0 (*)    HCT 29.5 (*)    MCV 77.8 (*)     MCH 23.7 (*)    RDW 16.7 (*)    Platelets 480 (*)    Neutro Abs 8.8 (*)    All other components within normal limits  BASIC METABOLIC PANEL - Abnormal; Notable for the following components:   CO2 21 (*)    Glucose, Bld 255 (*)    BUN 27 (*)    Creatinine, Ser 1.64 (*)    GFR calc non Af Amer 38 (*)    GFR calc Af Amer 44 (*)    All other components within normal limits  BRAIN NATRIURETIC PEPTIDE - Abnormal; Notable for the following components:   B Natriuretic Peptide 109.0 (*)    All other components within normal limits  FIBRIN DERIVATIVES D-DIMER ([facility] ONLY) - Abnormal; Notable for the following components:   Fibrin derivatives D-dimer ([facility]) 670.06 (*)    All other components within normal limits  TROPONIN I  INFLUENZA PANEL BY PCR (TYPE A & B)    RADIOLOGY Images were viewed by me  Chest x-ray IMPRESSION: 1. Negative for acute pulmonary embolus. 2. Borderline to mild cardiomegaly with small pericardial effusion 3. Mild mosaic attenuation, could be secondary to small airways disease. ____________________________________________   DIFFERENTIAL DIAGNOSIS   Anxiety, bronchitis, influenza, pneumonia, PE  FINAL ASSESSMENT AND PLAN  Dyspnea, small airway disease   Plan: The patient had presented for shortness of breath and right-sided rib pain. Patient's labs are overall reassuring and stable from her prior. Patient's imaging was negative on x-ray for acute process, CT revealed mild mosaic attenuation.  She will be placed on a Z-Pak.  She is cleared for outpatient follow-up.   Ulice Dash, MD    Note: This note was generated in part or whole with voice recognition software. Voice recognition is usually quite accurate but there are transcription errors that can and very often do occur. I apologize for any typographical errors that were not detected and corrected.     Emily Filbert, MD 05/23/18 2325

## 2018-05-23 NOTE — ED Triage Notes (Signed)
Pt from home with SOB/RIGHT SIDE RIB PAIN  For the last few days .

## 2018-05-23 NOTE — ED Notes (Signed)
Pt to xray at this time.

## 2018-05-27 DIAGNOSIS — R0602 Shortness of breath: Secondary | ICD-10-CM | POA: Insufficient documentation

## 2018-05-27 DIAGNOSIS — R0789 Other chest pain: Secondary | ICD-10-CM | POA: Insufficient documentation

## 2018-05-28 DIAGNOSIS — M546 Pain in thoracic spine: Secondary | ICD-10-CM | POA: Insufficient documentation

## 2018-06-12 ENCOUNTER — Encounter: Payer: Self-pay | Admitting: Emergency Medicine

## 2018-06-12 ENCOUNTER — Emergency Department
Admission: EM | Admit: 2018-06-12 | Discharge: 2018-06-12 | Disposition: A | Discharge status: Home / Self Care | Payer: BLUE CROSS/BLUE SHIELD | Attending: Student in an Organized Health Care Education/Training Program | Admitting: Student in an Organized Health Care Education/Training Program

## 2018-06-12 ENCOUNTER — Emergency Department: Payer: BLUE CROSS/BLUE SHIELD

## 2018-06-12 ENCOUNTER — Other Ambulatory Visit: Payer: Self-pay

## 2018-06-12 DIAGNOSIS — Z7982 Long term (current) use of aspirin: Secondary | ICD-10-CM | POA: Diagnosis not present

## 2018-06-12 DIAGNOSIS — I10 Essential (primary) hypertension: Secondary | ICD-10-CM | POA: Diagnosis not present

## 2018-06-12 DIAGNOSIS — J111 Influenza due to unidentified influenza virus with other respiratory manifestations: Secondary | ICD-10-CM | POA: Insufficient documentation

## 2018-06-12 DIAGNOSIS — E119 Type 2 diabetes mellitus without complications: Secondary | ICD-10-CM | POA: Diagnosis not present

## 2018-06-12 DIAGNOSIS — Z79899 Other long term (current) drug therapy: Secondary | ICD-10-CM | POA: Insufficient documentation

## 2018-06-12 DIAGNOSIS — R05 Cough: Secondary | ICD-10-CM | POA: Diagnosis present

## 2018-06-12 LAB — CBC WITH DIFFERENTIAL/PLATELET
Abs Immature Granulocytes: 0.03 10*3/uL (ref 0.00–0.07)
Basophils Absolute: 0 10*3/uL (ref 0.0–0.1)
Basophils Relative: 1 %
Eosinophils Absolute: 0.1 10*3/uL (ref 0.0–0.5)
Eosinophils Relative: 1 %
HCT: 26.3 % — ABNORMAL LOW (ref 36.0–46.0)
Hemoglobin: 8 g/dL — ABNORMAL LOW (ref 12.0–15.0)
Immature Granulocytes: 0 %
Lymphocytes Relative: 4 %
Lymphs Abs: 0.3 10*3/uL — ABNORMAL LOW (ref 0.7–4.0)
MCH: 24 pg — ABNORMAL LOW (ref 26.0–34.0)
MCHC: 30.4 g/dL (ref 30.0–36.0)
MCV: 79 fL — ABNORMAL LOW (ref 80.0–100.0)
MONOS PCT: 6 %
Monocytes Absolute: 0.5 10*3/uL (ref 0.1–1.0)
Neutro Abs: 7.6 10*3/uL (ref 1.7–7.7)
Neutrophils Relative %: 88 %
Platelets: 382 10*3/uL (ref 150–400)
RBC: 3.33 MIL/uL — ABNORMAL LOW (ref 3.87–5.11)
RDW: 16.3 % — ABNORMAL HIGH (ref 11.5–15.5)
WBC: 8.6 10*3/uL (ref 4.0–10.5)
nRBC: 0 % (ref 0.0–0.2)

## 2018-06-12 LAB — COMPREHENSIVE METABOLIC PANEL
ALT: 11 U/L (ref 0–44)
AST: 22 U/L (ref 15–41)
Albumin: 3 g/dL — ABNORMAL LOW (ref 3.5–5.0)
Alkaline Phosphatase: 43 U/L (ref 38–126)
Anion gap: 7 (ref 5–15)
BUN: 30 mg/dL — AB (ref 6–20)
CALCIUM: 8.2 mg/dL — AB (ref 8.9–10.3)
CO2: 19 mmol/L — ABNORMAL LOW (ref 22–32)
Chloride: 109 mmol/L (ref 98–111)
Creatinine, Ser: 1.6 mg/dL — ABNORMAL HIGH (ref 0.44–1.00)
GFR calc Af Amer: 46 mL/min — ABNORMAL LOW (ref >60)
GFR calc non Af Amer: 39 mL/min — ABNORMAL LOW (ref >60)
Glucose, Bld: 178 mg/dL — ABNORMAL HIGH (ref 70–99)
Potassium: 4.7 mmol/L (ref 3.5–5.1)
Sodium: 135 mmol/L (ref 135–145)
Total Bilirubin: 0.8 mg/dL (ref 0.3–1.2)
Total Protein: 7.1 g/dL (ref 6.5–8.1)

## 2018-06-12 MED ORDER — OSELTAMIVIR PHOSPHATE 30 MG PO CAPS
30.0000 mg | ORAL_CAPSULE | Freq: Once | ORAL | Status: AC
  Administered 2018-06-12: 30 mg via ORAL
  Filled 2018-06-12 (×2): qty 1

## 2018-06-12 MED ORDER — ONDANSETRON HCL 4 MG PO TABS: 4.0000 mg | ORAL_TABLET | Freq: Every day | ORAL | 0 refills | Status: DC | PRN

## 2018-06-12 MED ORDER — SODIUM CHLORIDE 0.9 % IV BOLUS
500.0000 mL | Freq: Once | INTRAVENOUS | Status: AC
  Administered 2018-06-12: 500 mL via INTRAVENOUS

## 2018-06-12 MED ORDER — PROMETHAZINE HCL 25 MG/ML IJ SOLN
12.5000 mg | Freq: Four times a day (QID) | INTRAMUSCULAR | Status: DC | PRN
  Administered 2018-06-12: 12.5 mg via INTRAVENOUS
  Filled 2018-06-12: qty 1

## 2018-06-12 MED ORDER — OSELTAMIVIR PHOSPHATE 30 MG PO CAPS: 30.0000 mg | ORAL_CAPSULE | Freq: Two times a day (BID) | ORAL | 0 refills | Status: AC

## 2018-06-12 NOTE — ED Notes (Signed)
Pt's oxygen remained over 95% with ambulation

## 2018-06-12 NOTE — ED Provider Notes (Signed)
[facility] Emergency Department Provider Note    First MD Initiated Contact with Patient 06/12/18 0805     (approximate)  I have reviewed the triage vital signs and the nursing notes.   HISTORY  Chief Complaint Cough; Generalized Body Aches; and Nausea    HPI Vickie Montoya is a 43 y.o. female presents to the ER for evaluation of 24 hours of flulike illness.  She is been having congestion dry cough nausea chills.  Her husband was recently started on Tamiflu after being diagnosed with influenza.  She denies any specific pain.  Does have a mild headache.  She does not smoke.  Does have a remote history of bronchitis but does not wear oxygen at home denies any history of chronic bronchitis or asthma.    Past Medical History:  Diagnosis Date  . Depression   . Diabetes mellitus without complication (HCC)   . Hypertension   . Migraine    Family History  Problem Relation Age of Onset  . Hyperlipidemia Mother    Past Surgical History:  Procedure Laterality Date  . CESAREAN SECTION    . TUBAL LIGATION    . tubiligation     Patient Active Problem List   Diagnosis Date Noted  . Cellulitis and abscess 07/13/2015  . Abscess of lower back 07/02/2015      Prior to Admission medications   Medication Sig Start Date End Date Taking? Authorizing Provider  aspirin EC 81 MG tablet Take 81 mg by mouth at bedtime.     [provider]  azithromycin (ZITHROMAX Z-PAK) 250 MG tablet Take 2 tablets (500 mg) on  Day 1,  followed by 1 tablet (250 mg) once daily on Days 2 through 5. 05/23/18   Emily FilbertWilliams, Jonathan E, MD  cephALEXin (KEFLEX) 500 MG capsule Take 1 capsule (500 mg total) by mouth 3 (three) times daily. 01/26/17   Minna AntisPaduchowski, Kevin, MD  chlorpheniramine-HYDROcodone (TUSSIONEX PENNKINETIC ER) 10-8 MG/5ML SUER Take 5 mLs by mouth 2 (two) times daily. 05/23/18   Emily FilbertWilliams, Jonathan E, MD  FLUoxetine (PROZAC) 10 MG tablet Take 10 mg by mouth daily.     [provider]  insulin detemir (LEVEMIR) 100 UNIT/ML injection Inject 49 Units into the skin at bedtime.    [provider]  lisinopril (PRINIVIL,ZESTRIL) 20 MG tablet Take 20 mg by mouth at bedtime.    [provider]  metFORMIN (GLUCOPHAGE-XR) 500 MG 24 hr tablet Take 1,000 mg by mouth daily with supper.    [provider]  pravastatin (PRAVACHOL) 40 MG tablet Take 40 mg by mouth at bedtime.     [provider]  prochlorperazine (COMPAZINE) 10 MG tablet Take 1 tablet (10 mg total) by mouth 2 (two) times daily as needed for nausea or vomiting (Nausea ). 02/01/17   Pisciotta, Joni ReiningNicole, PA-C  promethazine (PHENERGAN) 12.5 MG tablet Take 12.5 mg by mouth every 6 (six) hours as needed for nausea or vomiting.    [provider]  propranolol ER (INDERAL LA) 60 MG 24 hr capsule Take 60 mg by mouth daily.    [provider]  SUMAtriptan (IMITREX) 50 MG tablet Take 1 tablet (50 mg total) by mouth once as needed for migraine. May repeat in 2 hours if headache persists or recurs. 01/28/17 01/29/18  Merrily Brittleifenbark, Neil, MD    Allergies Patient has no known allergies.    Social History Social History   Tobacco Use  . Smoking status: Never Smoker  .  Smokeless tobacco: Never Used  Substance Use Topics  . Alcohol use: No  . Drug use: Yes    Types: Marijuana    Review of Systems Patient denies headaches, rhinorrhea, blurry vision, numbness, shortness of breath, chest pain, edema, cough, abdominal pain, nausea, vomiting, diarrhea, dysuria, fevers, rashes or hallucinations unless otherwise stated above in HPI. ____________________________________________   PHYSICAL EXAM:  VITAL SIGNS: Vitals:   06/12/18 0802  BP: (!) 172/74  Pulse: 95  Resp: 20  Temp: 99.9 F (37.7 C)  SpO2: 98%    Constitutional: Alert and oriented.  Eyes: Conjunctivae are normal.  Head: Atraumatic. Nose: No congestion/rhinnorhea. Mouth/Throat: Mucous membranes  are moist.   Neck: No stridor. Painless ROM.  Cardiovascular: Normal rate, regular rhythm. Grossly normal heart sounds.  Good peripheral circulation. Respiratory: Normal respiratory effort.  No retractions. Lungs CTAB. Gastrointestinal: Soft and nontender. No distention. No abdominal bruits. No CVA tenderness. Genitourinary:  Musculoskeletal: No lower extremity tenderness nor edema.  No joint effusions. Neurologic:  Normal speech and language. No gross focal neurologic deficits are appreciated. No facial droop Skin:  Skin is warm, dry and intact. No rash noted. Psychiatric: Mood and affect are normal. Speech and behavior are normal.  ____________________________________________   LABS (all labs ordered are listed, but only abnormal results are displayed)  Results for orders placed or performed during the hospital encounter of 06/12/18 (from the past 24 hour(s))  CBC with Differential/Platelet     Status: Abnormal   Collection Time: 06/12/18  8:21 AM  Result Value Ref Range   WBC 8.6 4.0 - 10.5 K/uL   RBC 3.33 (L) 3.87 - 5.11 MIL/uL   Hemoglobin 8.0 (L) 12.0 - 15.0 g/dL   HCT 17.4 (L) 94.4 - 96.7 %   MCV 79.0 (L) 80.0 - 100.0 fL   MCH 24.0 (L) 26.0 - 34.0 pg   MCHC 30.4 30.0 - 36.0 g/dL   RDW 59.1 (H) 63.8 - 46.6 %   Platelets 382 150 - 400 K/uL   nRBC 0.0 0.0 - 0.2 %   Neutrophils Relative % 88 %   Neutro Abs 7.6 1.7 - 7.7 K/uL   Lymphocytes Relative 4 %   Lymphs Abs 0.3 (L) 0.7 - 4.0 K/uL   Monocytes Relative 6 %   Monocytes Absolute 0.5 0.1 - 1.0 K/uL   Eosinophils Relative 1 %   Eosinophils Absolute 0.1 0.0 - 0.5 K/uL   Basophils Relative 1 %   Basophils Absolute 0.0 0.0 - 0.1 K/uL   Immature Granulocytes 0 %   Abs Immature Granulocytes 0.03 0.00 - 0.07 K/uL  Comprehensive metabolic panel     Status: Abnormal   Collection Time: 06/12/18  8:21 AM  Result Value Ref Range   Sodium 135 135 - 145 mmol/L   Potassium 4.7 3.5 - 5.1 mmol/L   Chloride 109 98 - 111 mmol/L    CO2 19 (L) 22 - 32 mmol/L   Glucose, Bld 178 (H) 70 - 99 mg/dL   BUN 30 (H) 6 - 20 mg/dL   Creatinine, Ser 5.99 (H) 0.44 - 1.00 mg/dL   Calcium 8.2 (L) 8.9 - 10.3 mg/dL   Total Protein 7.1 6.5 - 8.1 g/dL   Albumin 3.0 (L) 3.5 - 5.0 g/dL   AST 22 15 - 41 U/L   ALT 11 0 - 44 U/L   Alkaline Phosphatase 43 38 - 126 U/L   Total Bilirubin 0.8 0.3 - 1.2 mg/dL   GFR calc non Af Amer 39 (L) >  60 mL/min   GFR calc Af Amer 46 (L) >60 mL/min   Anion gap 7 5 - 15   ____________________________________________ ____________________________________________  RADIOLOGY  I personally reviewed all radiographic images ordered to evaluate for the above acute complaints and reviewed radiology reports and findings.  These findings were personally discussed with the patient.  Please see medical record for radiology report.  ____________________________________________   PROCEDURES  Procedure(s) performed:  Procedures    Critical Care performed: no ____________________________________________   INITIAL IMPRESSION / ASSESSMENT AND PLAN / ED COURSE  Pertinent labs & imaging results that were available during my care of the patient were reviewed by me and considered in my medical decision making (see chart for details).   DDX: cough, flu, congestion, pna, dehydration  Vickie Montoya is a 43 y.o. who presents to the ED with symptoms as described above.  Presentation most consistent with flu.  Will check blood work as well as chest x-ray.  Plan on empirically treating for flu given known sick contact with household member diagnosed with flu.  She also just started symptoms so would be a good candidate for.  Clinical Course as of Jun 12 1002  Tue Jun 12, 2018  0912 Blood work at patient's baseline.  Likely influenza.  Will start Tamiflu.  Will ambulate to evaluate if patient is hypoxic.   [PR]  1002 Patient able to ambulate without any hypoxia.  No acute distress.  Will be discharged home on Tamiflu  as well as nausea medication.   [PR]    Clinical Course User Index [PR] Willy Eddyobinson, Arnola Crittendon, MD     As part of my medical decision making, I reviewed the following data within the electronic MEDICAL RECORD NUMBER Nursing notes reviewed and incorporated, Labs reviewed, notes from prior ED visits and [place] Controlled Substance Database   ____________________________________________   FINAL CLINICAL IMPRESSION(S) / ED DIAGNOSES  Final diagnoses:  Influenza      NEW MEDICATIONS STARTED DURING THIS VISIT:  New Prescriptions   No medications on file     Note:  This document was prepared using Dragon voice recognition software and may include unintentional dictation errors.    Willy Eddyobinson, Fawn Desrocher, MD 06/12/18 1006

## 2018-06-12 NOTE — ED Notes (Signed)
Husband called for transportation home.  

## 2018-06-12 NOTE — ED Notes (Signed)
Pt's daughter came in room and states she called her dad who now states he cannot pick up pt.

## 2018-06-12 NOTE — ED Triage Notes (Signed)
PT arrived via ems from home with complaints of generalized body aches, productive cough, and nausea that started yesterday.

## 2018-07-30 DIAGNOSIS — E782 Mixed hyperlipidemia: Secondary | ICD-10-CM | POA: Insufficient documentation

## 2018-08-06 DIAGNOSIS — IMO0002 Reserved for concepts with insufficient information to code with codable children: Secondary | ICD-10-CM | POA: Insufficient documentation

## 2018-08-10 DIAGNOSIS — E1142 Type 2 diabetes mellitus with diabetic polyneuropathy: Secondary | ICD-10-CM | POA: Insufficient documentation

## 2018-08-10 DIAGNOSIS — N183 Chronic kidney disease, stage 3 unspecified: Secondary | ICD-10-CM | POA: Insufficient documentation

## 2018-08-10 DIAGNOSIS — E785 Hyperlipidemia, unspecified: Secondary | ICD-10-CM | POA: Insufficient documentation

## 2018-10-08 DIAGNOSIS — H4312 Vitreous hemorrhage, left eye: Secondary | ICD-10-CM | POA: Insufficient documentation

## 2018-10-08 DIAGNOSIS — E113521 Type 2 diabetes mellitus with proliferative diabetic retinopathy with traction retinal detachment involving the macula, right eye: Secondary | ICD-10-CM | POA: Insufficient documentation

## 2018-10-08 DIAGNOSIS — E083592 Diabetes mellitus due to underlying condition with proliferative diabetic retinopathy without macular edema, left eye: Secondary | ICD-10-CM | POA: Insufficient documentation

## 2019-01-14 ENCOUNTER — Emergency Department: Payer: BC Managed Care – PPO

## 2019-01-14 ENCOUNTER — Other Ambulatory Visit: Payer: Self-pay

## 2019-01-14 ENCOUNTER — Emergency Department
Admission: EM | Admit: 2019-01-14 | Discharge: 2019-01-14 | Disposition: A | Discharge status: Home / Self Care | Payer: BC Managed Care – PPO | Attending: Emergency Medicine | Admitting: Emergency Medicine

## 2019-01-14 ENCOUNTER — Encounter: Payer: Self-pay | Admitting: Emergency Medicine

## 2019-01-14 DIAGNOSIS — Z7982 Long term (current) use of aspirin: Secondary | ICD-10-CM | POA: Insufficient documentation

## 2019-01-14 DIAGNOSIS — Y939 Activity, unspecified: Secondary | ICD-10-CM | POA: Insufficient documentation

## 2019-01-14 DIAGNOSIS — F1210 Cannabis abuse, uncomplicated: Secondary | ICD-10-CM | POA: Diagnosis not present

## 2019-01-14 DIAGNOSIS — S93402A Sprain of unspecified ligament of left ankle, initial encounter: Secondary | ICD-10-CM | POA: Insufficient documentation

## 2019-01-14 DIAGNOSIS — Z79899 Other long term (current) drug therapy: Secondary | ICD-10-CM | POA: Insufficient documentation

## 2019-01-14 DIAGNOSIS — Y999 Unspecified external cause status: Secondary | ICD-10-CM | POA: Diagnosis not present

## 2019-01-14 DIAGNOSIS — S92352A Displaced fracture of fifth metatarsal bone, left foot, initial encounter for closed fracture: Secondary | ICD-10-CM | POA: Insufficient documentation

## 2019-01-14 DIAGNOSIS — W19XXXA Unspecified fall, initial encounter: Secondary | ICD-10-CM | POA: Diagnosis not present

## 2019-01-14 DIAGNOSIS — E119 Type 2 diabetes mellitus without complications: Secondary | ICD-10-CM | POA: Insufficient documentation

## 2019-01-14 DIAGNOSIS — Y929 Unspecified place or not applicable: Secondary | ICD-10-CM | POA: Insufficient documentation

## 2019-01-14 DIAGNOSIS — Z7984 Long term (current) use of oral hypoglycemic drugs: Secondary | ICD-10-CM | POA: Insufficient documentation

## 2019-01-14 DIAGNOSIS — I10 Essential (primary) hypertension: Secondary | ICD-10-CM | POA: Insufficient documentation

## 2019-01-14 DIAGNOSIS — S99912A Unspecified injury of left ankle, initial encounter: Secondary | ICD-10-CM | POA: Diagnosis present

## 2019-01-14 MED ORDER — FENTANYL CITRATE (PF) 100 MCG/2ML IJ SOLN
25.0000 ug | Freq: Once | INTRAMUSCULAR | Status: AC
  Administered 2019-01-14: 25 ug via INTRAVENOUS
  Filled 2019-01-14: qty 2

## 2019-01-14 MED ORDER — OXYCODONE-ACETAMINOPHEN 7.5-325 MG PO TABS: 1.0000 | ORAL_TABLET | Freq: Four times a day (QID) | ORAL | 0 refills | Status: DC | PRN

## 2019-01-14 NOTE — ED Triage Notes (Signed)
Presents via ems  S/p fall  States she was trying to get up and her foot was asleep  Felt a snap to left ankle

## 2019-01-14 NOTE — ED Provider Notes (Signed)
[facility] Emergency Department Provider Note   ____________________________________________   First MD Initiated Contact with Patient 01/14/19 1250     (approximate)  I have reviewed the triage vital signs and the nursing notes.   HISTORY  Chief Complaint Fall and Ankle Pain   HPI Vickie Montoya is a 43 y.o. female presents to the ED via EMS after a fall.  Patient states that she was getting up and her foot fell asleep.  She states she felt a sudden snap to her left ankle and was unable to bear weight.  She denies any head injury or loss of consciousness.  Patient was given fentanyl 100 mg IV in route via EMS to the ED.  Currently she rates her pain as a 10/10.       Past Medical History:  Diagnosis Date  . Depression   . Diabetes mellitus without complication (HCC)   . Hypertension   . Migraine     Patient Active Problem List   Diagnosis Date Noted  . Cellulitis and abscess 07/13/2015  . Abscess of lower back 07/02/2015    Past Surgical History:  Procedure Laterality Date  . CESAREAN SECTION    . TUBAL LIGATION    . tubiligation      Prior to Admission medications   Medication Sig Start Date End Date Taking? Authorizing Provider  aspirin EC 81 MG tablet Take 81 mg by mouth at bedtime.     [provider]  FLUoxetine (PROZAC) 10 MG tablet Take 10 mg by mouth daily.    [provider]  insulin detemir (LEVEMIR) 100 UNIT/ML injection Inject 49 Units into the skin at bedtime.    [provider]  lisinopril (PRINIVIL,ZESTRIL) 20 MG tablet Take 20 mg by mouth at bedtime.    [provider]  metFORMIN (GLUCOPHAGE-XR) 500 MG 24 hr tablet Take 1,000 mg by mouth daily with supper.    [provider]  ondansetron (ZOFRAN) 4 MG tablet Take 1 tablet (4 mg total) by mouth daily as needed for nausea or vomiting. 06/12/18 06/12/19  Willy Eddyobinson, Patrick, MD  oxyCODONE-acetaminophen (PERCOCET) 7.5-325 MG tablet  Take 1 tablet by mouth every 6 (six) hours as needed for severe pain. 01/14/19 01/14/20  Tommi RumpsSummers,  L, PA-C  pravastatin (PRAVACHOL) 40 MG tablet Take 40 mg by mouth at bedtime.     [provider]  prochlorperazine (COMPAZINE) 10 MG tablet Take 1 tablet (10 mg total) by mouth 2 (two) times daily as needed for nausea or vomiting (Nausea ). 02/01/17   Pisciotta, Joni ReiningNicole, PA-C  promethazine (PHENERGAN) 12.5 MG tablet Take 12.5 mg by mouth every 6 (six) hours as needed for nausea or vomiting.    [provider]  propranolol ER (INDERAL LA) 60 MG 24 hr capsule Take 60 mg by mouth daily.    [provider]  SUMAtriptan (IMITREX) 50 MG tablet Take 1 tablet (50 mg total) by mouth once as needed for migraine. May repeat in 2 hours if headache persists or recurs. 01/28/17 01/29/18  Merrily Brittleifenbark, Neil, MD    Allergies Patient has no known allergies.  Family History  Problem Relation Age of Onset  . Hyperlipidemia Mother     Social History Social History   Tobacco Use  . Smoking status: Never Smoker  . Smokeless tobacco: Never Used  Substance Use Topics  . Alcohol use: No  . Drug use: Yes    Types: Marijuana    Review of Systems Constitutional: No  fever/chills Eyes: No visual changes. ENT: No trauma. Cardiovascular: Denies chest pain. Respiratory: Denies shortness of breath. Gastrointestinal: No abdominal pain.  No nausea, no vomiting. Musculoskeletal: Positive for left foot and ankle pain. Skin: Negative for rash. Neurological: Negative for headaches, focal weakness or numbness. ___________________________________________   PHYSICAL EXAM:  VITAL SIGNS: ED Triage Vitals  Enc Vitals Group     BP      Pulse      Resp      Temp      Temp src      SpO2      Weight      Height      Head Circumference      Peak Flow      Pain Score      Pain Loc      Pain Edu?      Excl. in [place]?     Constitutional: Alert and oriented. Well appearing and in no acute  distress. Eyes: Conjunctivae are normal.  Head: Atraumatic. Nose: No trauma. Neck: No stridor.   Cardiovascular: Normal rate, regular rhythm. Grossly normal heart sounds.  Good peripheral circulation. Respiratory: Normal respiratory effort.  No retractions. Lungs CTAB. Musculoskeletal: On examination of the left foot and ankle there is skin is intact and no ecchymosis or abrasions were noted.  There is marked tenderness on palpation of the fifth metatarsal with soft tissue edema.  There is also some soft tissue edema noted around the lateral malleolus.  Patient is markedly tender in general to palpation of the ankle and foot.  Motor sensory function intact.  Capillary refills less than 3 seconds. Neurologic:  Normal speech and language. No gross focal neurologic deficits are appreciated. No gait instability. Skin:  Skin is warm, dry and intact.  No ecchymosis or abrasions seen. Psychiatric: Mood and affect are normal. Speech and behavior are normal.  ____________________________________________   LABS (all labs ordered are listed, but only abnormal results are displayed)  Labs Reviewed - No data to display   RADIOLOGY  Official radiology report(s): Dg Ankle Complete Left  Result Date: 01/14/2019 CLINICAL DATA:  43 year old female with fall and left ankle injury. EXAM: LEFT ANKLE COMPLETE - 3+ VIEW COMPARISON:  None. FINDINGS: Minimally displaced fracture of the fifth metatarsal. Dedicated foot radiograph is recommended for better evaluation of the foot. No displaced fracture or dislocation noted at the ankle. There is faint linear lucency of the lateral malleolus which may be artifactual. A tiny nondisplaced avulsion injury is not excluded. Clinical correlation is recommended. There is soft tissue swelling over the lateral malleolus. IMPRESSION: 1. Minimally displaced fracture of the fifth metatarsal. 2. Linear lucency in the lateral malleolus may be artifactual or represent a nondisplaced  avulsion fracture. Clinical correlation is recommended. Electronically Signed   By: Anner Crete M.D.   On: 01/14/2019 13:27   Dg Foot Complete Left  Result Date: 01/14/2019 CLINICAL DATA:  Left foot pain after a fall today. Initial encounter. EXAM: LEFT FOOT - COMPLETE 3+ VIEW COMPARISON:  None. FINDINGS: The patient has an acute fracture of the diaphysis of the fifth metatarsal. The fracture extends from the medial cortex of the proximal diaphysis exiting the lateral cortex distally. There is slight medial displacement of the distal fracture fragment. No other acute abnormality. Plantar and dorsal calcaneal spurring noted. IMPRESSION: Acute diaphyseal fracture left fifth metatarsal as described. Electronically Signed   By: Inge Rise M.D.   On: 01/14/2019 13:56    ____________________________________________   PROCEDURES  Procedure(s) performed (including Critical Care):  Procedures  Posterior OCL was applied to the patient's left foot by [facility] ED tech.  He had difficulty getting the patient to hold her foot at an angle correctly.  While OCL material was still pliable I held pressure so that she was not dropping her foot.  ____________________________________________   INITIAL IMPRESSION / ASSESSMENT AND PLAN / ED COURSE  As part of my medical decision making, I reviewed the following data within the electronic MEDICAL RECORD NUMBER Notes from prior ED visits and [place] Controlled Substance Database  43 year old female presents to the ED via EMS after a fall that occurred at home.  Patient has vision difficulties and also she felt that her foot was asleep.  X-rays showed fracture of the fifth metatarsal and a questionable small avulsion fracture of the lateral malleolus.  Patient and husband assure me that they have a walker at home that she will be able to use.  She is made aware that she cannot weight-bear on her foot and she is to call make an appoint with the orthopedist.  She is to  leave the [facility] splint on until her appointment.  She is strongly encouraged to ice and elevate to reduce swelling.  A prescription for Percocet was sent to her pharmacy.  She was also warned that this could cause drowsiness and increase her risk for falling.  ____________________________________________   FINAL CLINICAL IMPRESSION(S) / ED DIAGNOSES  Final diagnoses:  Closed displaced fracture of fifth metatarsal bone of left foot, initial encounter  Sprain of left ankle, unspecified ligament, initial encounter     ED Discharge Orders         Ordered    oxyCODONE-acetaminophen (PERCOCET) 7.5-325 MG tablet  Every 6 hours PRN     01/14/19 1538           Note:  This document was prepared using Dragon voice recognition software and may include unintentional dictation errors.    Tommi Rumps, PA-C 01/14/19 1643    Chesley Noon, MD 01/16/19 [phone]

## 2019-01-14 NOTE — Discharge Instructions (Addendum)
Call make an appointment with Dr. Marry Guan who is the orthopedist on call.  Ice and elevation until you are seen by the orthopedist and leave the splint on until then.  Take pain medication only as directed.  This medication could cause drowsiness and increase your risk for fall injuries.  Do not weight-bear on your left foot and use your walker anytime you are up for added support.

## 2019-01-17 ENCOUNTER — Other Ambulatory Visit: Payer: Self-pay

## 2019-01-17 ENCOUNTER — Other Ambulatory Visit
Admission: RE | Admit: 2019-01-17 | Discharge: 2019-01-17 | Disposition: A | Discharge status: Home / Self Care | Payer: BC Managed Care – PPO | Source: Ambulatory Visit | Attending: Podiatry | Admitting: Podiatry

## 2019-01-17 ENCOUNTER — Other Ambulatory Visit: Payer: Self-pay | Admitting: Podiatry

## 2019-01-17 DIAGNOSIS — Z20828 Contact with and (suspected) exposure to other viral communicable diseases: Secondary | ICD-10-CM | POA: Diagnosis present

## 2019-01-17 DIAGNOSIS — Z01812 Encounter for preprocedural laboratory examination: Secondary | ICD-10-CM | POA: Diagnosis not present

## 2019-01-18 ENCOUNTER — Ambulatory Visit: Payer: BC Managed Care – PPO | Admitting: Anesthesiology

## 2019-01-18 ENCOUNTER — Ambulatory Visit
Admission: RE | Admit: 2019-01-18 | Discharge: 2019-01-18 | Disposition: A | Discharge status: Home / Self Care | Payer: BC Managed Care – PPO | Attending: Podiatry | Admitting: Podiatry

## 2019-01-18 ENCOUNTER — Encounter
Admission: RE | Disposition: A | Discharge status: Home / Self Care | Payer: Self-pay | Source: Home / Self Care | Attending: Podiatry

## 2019-01-18 ENCOUNTER — Encounter: Payer: Self-pay | Admitting: Emergency Medicine

## 2019-01-18 DIAGNOSIS — H548 Legal blindness, as defined in USA: Secondary | ICD-10-CM | POA: Insufficient documentation

## 2019-01-18 DIAGNOSIS — Z7982 Long term (current) use of aspirin: Secondary | ICD-10-CM | POA: Insufficient documentation

## 2019-01-18 DIAGNOSIS — M329 Systemic lupus erythematosus, unspecified: Secondary | ICD-10-CM | POA: Insufficient documentation

## 2019-01-18 DIAGNOSIS — Z6841 Body Mass Index (BMI) 40.0 and over, adult: Secondary | ICD-10-CM | POA: Insufficient documentation

## 2019-01-18 DIAGNOSIS — Z794 Long term (current) use of insulin: Secondary | ICD-10-CM | POA: Diagnosis not present

## 2019-01-18 DIAGNOSIS — S92352A Displaced fracture of fifth metatarsal bone, left foot, initial encounter for closed fracture: Secondary | ICD-10-CM | POA: Insufficient documentation

## 2019-01-18 DIAGNOSIS — E119 Type 2 diabetes mellitus without complications: Secondary | ICD-10-CM | POA: Insufficient documentation

## 2019-01-18 DIAGNOSIS — I10 Essential (primary) hypertension: Secondary | ICD-10-CM | POA: Insufficient documentation

## 2019-01-18 DIAGNOSIS — Z79899 Other long term (current) drug therapy: Secondary | ICD-10-CM | POA: Insufficient documentation

## 2019-01-18 DIAGNOSIS — W19XXXA Unspecified fall, initial encounter: Secondary | ICD-10-CM | POA: Insufficient documentation

## 2019-01-18 HISTORY — PX: ORIF TOE FRACTURE: SHX5032

## 2019-01-18 HISTORY — DX: Unspecified visual loss: H54.7

## 2019-01-18 LAB — GLUCOSE, CAPILLARY
Glucose-Capillary: 136 mg/dL — ABNORMAL HIGH (ref 70–99)
Glucose-Capillary: 69 mg/dL — ABNORMAL LOW (ref 70–99)
Glucose-Capillary: 155 mg/dL — ABNORMAL HIGH (ref 70–99)

## 2019-01-18 LAB — URINE DRUG SCREEN, QUALITATIVE (ARMC ONLY)
Amphetamines, Ur Screen: NOT DETECTED
Barbiturates, Ur Screen: NOT DETECTED
Benzodiazepine, Ur Scrn: NOT DETECTED
Cannabinoid 50 Ng, Ur ~~LOC~~: POSITIVE — AB
Cocaine Metabolite,Ur ~~LOC~~: NOT DETECTED
MDMA (Ecstasy)Ur Screen: NOT DETECTED
Methadone Scn, Ur: NOT DETECTED
Opiate, Ur Screen: NOT DETECTED
Phencyclidine (PCP) Ur S: NOT DETECTED
Tricyclic, Ur Screen: NOT DETECTED

## 2019-01-18 LAB — SARS CORONAVIRUS 2 (TAT 6-24 HRS): SARS Coronavirus 2: NEGATIVE

## 2019-01-18 LAB — POCT PREGNANCY, URINE: Preg Test, Ur: NEGATIVE

## 2019-01-18 SURGERY — OPEN REDUCTION INTERNAL FIXATION (ORIF) METATARSAL (TOE) FRACTURE
Anesthesia: General | Site: Toe | Laterality: Left

## 2019-01-18 MED ORDER — ONDANSETRON HCL 4 MG PO TABS: 4.0000 mg | ORAL_TABLET | Freq: Four times a day (QID) | ORAL | Status: DC | PRN

## 2019-01-18 MED ORDER — ONDANSETRON HCL 4 MG PO TABS: 4.0000 mg | ORAL_TABLET | Freq: Every day | ORAL | 1 refills | Status: AC | PRN

## 2019-01-18 MED ORDER — ONDANSETRON HCL 4 MG/2ML IJ SOLN: 4.0000 mg | Freq: Once | INTRAMUSCULAR | Status: DC | PRN

## 2019-01-18 MED ORDER — MIDAZOLAM HCL 2 MG/2ML IJ SOLN
INTRAMUSCULAR | Status: AC
  Filled 2019-01-18: qty 2

## 2019-01-18 MED ORDER — FENTANYL CITRATE (PF) 100 MCG/2ML IJ SOLN
INTRAMUSCULAR | Status: DC | PRN
  Administered 2019-01-18: 100 ug via INTRAVENOUS

## 2019-01-18 MED ORDER — BUPIVACAINE LIPOSOME 1.3 % IJ SUSP
INTRAMUSCULAR | Status: AC
  Filled 2019-01-18: qty 20

## 2019-01-18 MED ORDER — LIDOCAINE HCL (PF) 1 % IJ SOLN
INTRAMUSCULAR | Status: AC
  Filled 2019-01-18: qty 30

## 2019-01-18 MED ORDER — BUPIVACAINE LIPOSOME 1.3 % IJ SUSP
INTRAMUSCULAR | Status: DC | PRN
  Administered 2019-01-18 (×2): 10 mL

## 2019-01-18 MED ORDER — FAMOTIDINE 20 MG PO TABS
ORAL_TABLET | ORAL | Status: AC
  Filled 2019-01-18: qty 1

## 2019-01-18 MED ORDER — DEXAMETHASONE SODIUM PHOSPHATE 10 MG/ML IJ SOLN
INTRAMUSCULAR | Status: DC | PRN
  Administered 2019-01-18: 5 mg via INTRAVENOUS

## 2019-01-18 MED ORDER — LIDOCAINE-EPINEPHRINE 1 %-1:100000 IJ SOLN
INTRAMUSCULAR | Status: AC
  Filled 2019-01-18: qty 1

## 2019-01-18 MED ORDER — HYDROCODONE-ACETAMINOPHEN 5-325 MG PO TABS: 1.0000 | ORAL_TABLET | Freq: Four times a day (QID) | ORAL | 0 refills | Status: DC | PRN

## 2019-01-18 MED ORDER — FENTANYL CITRATE (PF) 250 MCG/5ML IJ SOLN
INTRAMUSCULAR | Status: AC
  Filled 2019-01-18: qty 5

## 2019-01-18 MED ORDER — BUPIVACAINE HCL (PF) 0.5 % IJ SOLN
INTRAMUSCULAR | Status: AC
  Filled 2019-01-18: qty 30

## 2019-01-18 MED ORDER — ROCURONIUM BROMIDE 100 MG/10ML IV SOLN
INTRAVENOUS | Status: DC | PRN
  Administered 2019-01-18: 30 mg via INTRAVENOUS

## 2019-01-18 MED ORDER — HYDROCODONE-ACETAMINOPHEN 5-325 MG PO TABS
ORAL_TABLET | ORAL | Status: AC
  Filled 2019-01-18: qty 1

## 2019-01-18 MED ORDER — SUCCINYLCHOLINE CHLORIDE 20 MG/ML IJ SOLN
INTRAMUSCULAR | Status: DC | PRN
  Administered 2019-01-18: 120 mg via INTRAVENOUS

## 2019-01-18 MED ORDER — SODIUM CHLORIDE 0.9 % IV SOLN: INTRAVENOUS | Status: DC

## 2019-01-18 MED ORDER — CEFAZOLIN SODIUM-DEXTROSE 2-4 GM/100ML-% IV SOLN
2.0000 g | INTRAVENOUS | Status: AC
  Administered 2019-01-18: 11:00:00 2 g via INTRAVENOUS

## 2019-01-18 MED ORDER — MIDAZOLAM HCL 2 MG/2ML IJ SOLN
INTRAMUSCULAR | Status: DC | PRN
  Administered 2019-01-18: 2 mg via INTRAVENOUS

## 2019-01-18 MED ORDER — HYDROCODONE-ACETAMINOPHEN 5-325 MG PO TABS
1.0000 | ORAL_TABLET | Freq: Once | ORAL | Status: AC
  Administered 2019-01-18: 14:00:00 1 via ORAL

## 2019-01-18 MED ORDER — ONDANSETRON HCL 4 MG/2ML IJ SOLN: 4.0000 mg | Freq: Four times a day (QID) | INTRAMUSCULAR | Status: DC | PRN

## 2019-01-18 MED ORDER — LIDOCAINE HCL (CARDIAC) PF 100 MG/5ML IV SOSY
PREFILLED_SYRINGE | INTRAVENOUS | Status: DC | PRN
  Administered 2019-01-18: 100 mg via INTRAVENOUS

## 2019-01-18 MED ORDER — PROPOFOL 10 MG/ML IV BOLUS
INTRAVENOUS | Status: DC | PRN
  Administered 2019-01-18: 150 mg via INTRAVENOUS

## 2019-01-18 MED ORDER — METOCLOPRAMIDE HCL 10 MG PO TABS: 5.0000 mg | ORAL_TABLET | Freq: Three times a day (TID) | ORAL | Status: DC | PRN

## 2019-01-18 MED ORDER — POVIDONE-IODINE 7.5 % EX SOLN
Freq: Once | CUTANEOUS | Status: DC
  Filled 2019-01-18: qty 118

## 2019-01-18 MED ORDER — ONDANSETRON HCL 4 MG/2ML IJ SOLN
INTRAMUSCULAR | Status: DC | PRN
  Administered 2019-01-18: 4 mg via INTRAVENOUS

## 2019-01-18 MED ORDER — FAMOTIDINE 20 MG PO TABS
20.0000 mg | ORAL_TABLET | Freq: Once | ORAL | Status: AC
  Administered 2019-01-18: 20 mg via ORAL

## 2019-01-18 MED ORDER — CEFAZOLIN SODIUM-DEXTROSE 2-4 GM/100ML-% IV SOLN
INTRAVENOUS | Status: AC
  Filled 2019-01-18: qty 100

## 2019-01-18 MED ORDER — EPHEDRINE SULFATE 50 MG/ML IJ SOLN
INTRAMUSCULAR | Status: DC | PRN
  Administered 2019-01-18: 7.5 mg via INTRAVENOUS
  Administered 2019-01-18: 10 mg via INTRAVENOUS

## 2019-01-18 MED ORDER — ROCURONIUM BROMIDE 50 MG/5ML IV SOLN
INTRAVENOUS | Status: AC
  Filled 2019-01-18: qty 1

## 2019-01-18 MED ORDER — DEXTROSE-NACL 5-0.9 % IV SOLN
INTRAVENOUS | Status: DC
  Administered 2019-01-18: 09:00:00 via INTRAVENOUS

## 2019-01-18 MED ORDER — SUGAMMADEX SODIUM 200 MG/2ML IV SOLN
INTRAVENOUS | Status: AC
  Filled 2019-01-18: qty 4

## 2019-01-18 MED ORDER — FENTANYL CITRATE (PF) 100 MCG/2ML IJ SOLN: 25.0000 ug | INTRAMUSCULAR | Status: DC | PRN

## 2019-01-18 MED ORDER — LIDOCAINE HCL (PF) 2 % IJ SOLN
INTRAMUSCULAR | Status: AC
  Filled 2019-01-18: qty 10

## 2019-01-18 MED ORDER — METOCLOPRAMIDE HCL 5 MG/ML IJ SOLN: 5.0000 mg | Freq: Three times a day (TID) | INTRAMUSCULAR | Status: DC | PRN

## 2019-01-18 SURGICAL SUPPLY — 52 items
BENZOIN TINCTURE PRP APPL 2/3 (GAUZE/BANDAGES/DRESSINGS) ×2 IMPLANT
BIT DRILL 100X2XQC STRL (BIT) ×1 IMPLANT
BIT DRILL QC 2.0X100 (BIT) ×1
BIT DRILL QC 2.4X100 (BIT) ×2 IMPLANT
BIT DRL 100X2XQC STRL (BIT) ×1
BLADE SURG 15 STRL LF DISP TIS (BLADE) ×2 IMPLANT
BLADE SURG 15 STRL SS (BLADE) ×2
BNDG COHESIVE 4X5 TAN STRL (GAUZE/BANDAGES/DRESSINGS) ×2 IMPLANT
BNDG CONFORM 2 STRL LF (GAUZE/BANDAGES/DRESSINGS) ×2 IMPLANT
BNDG CONFORM 3 STRL LF (GAUZE/BANDAGES/DRESSINGS) ×2 IMPLANT
BNDG ELASTIC 4X5.8 VLCR NS LF (GAUZE/BANDAGES/DRESSINGS) ×4 IMPLANT
BNDG GAUZE 4.5X4.1 6PLY STRL (MISCELLANEOUS) ×2 IMPLANT
CANISTER SUCT 1200ML W/VALVE (MISCELLANEOUS) ×4 IMPLANT
COVER WAND RF STERILE (DRAPES) ×2 IMPLANT
CUFF TOURN SGL QUICK 12 (TOURNIQUET CUFF) IMPLANT
CUFF TOURN SGL QUICK 18X4 (TOURNIQUET CUFF) ×2 IMPLANT
DRAPE FLUOR MINI C-ARM 54X84 (DRAPES) ×2 IMPLANT
DURAPREP 26ML APPLICATOR (WOUND CARE) ×2 IMPLANT
ELECT REM PT RETURN 9FT ADLT (ELECTROSURGICAL) ×2
ELECTRODE REM PT RTRN 9FT ADLT (ELECTROSURGICAL) ×1 IMPLANT
GAUZE SPONGE 4X4 12PLY STRL (GAUZE/BANDAGES/DRESSINGS) ×2 IMPLANT
GAUZE XEROFORM 1X8 LF (GAUZE/BANDAGES/DRESSINGS) ×2 IMPLANT
GLOVE BIO SURGEON STRL SZ7.5 (GLOVE) ×2 IMPLANT
GLOVE INDICATOR 8.0 STRL GRN (GLOVE) ×2 IMPLANT
GOWN STRL REUS W/ TWL LRG LVL3 (GOWN DISPOSABLE) ×3 IMPLANT
GOWN STRL REUS W/TWL LRG LVL3 (GOWN DISPOSABLE) ×3
NEEDLE FILTER BLUNT 18X 1/2SAF (NEEDLE) ×1
NEEDLE FILTER BLUNT 18X1 1/2 (NEEDLE) ×1 IMPLANT
NEEDLE HYPO 22GX1.5 SAFETY (NEEDLE) ×2 IMPLANT
NEEDLE HYPO 25X1 1.5 SAFETY (NEEDLE) ×4 IMPLANT
NS IRRIG 500ML POUR BTL (IV SOLUTION) ×2 IMPLANT
PACK EXTREMITY ARMC (MISCELLANEOUS) ×2 IMPLANT
PADDING CAST BLEND 4X4 NS (MISCELLANEOUS) ×2 IMPLANT
PENCIL ELECTRO HAND CTR (MISCELLANEOUS) ×2 IMPLANT
PLATE LC DCP 2.0 6H (Plate) ×2 IMPLANT
SCREW CORTEX 2.4 14MM (Screw) ×4 IMPLANT
SCREW CORTEX 2.4X12 (Screw) ×8 IMPLANT
SPLINT CAST 1 STEP 4X30 (MISCELLANEOUS) IMPLANT
SPLINT FAST PLASTER 5X30 (CAST SUPPLIES)
SPLINT PLASTER CAST FAST 5X30 (CAST SUPPLIES) IMPLANT
STOCKINETTE M/LG 89821 (MISCELLANEOUS) ×2 IMPLANT
STRAP SAFETY 5IN WIDE (MISCELLANEOUS) ×2 IMPLANT
STRIP CLOSURE SKIN 1/4X4 (GAUZE/BANDAGES/DRESSINGS) ×2 IMPLANT
SUT MNCRL 4-0 (SUTURE) ×1
SUT MNCRL 4-0 27XMFL (SUTURE) ×1
SUT VIC AB 3-0 SH 27 (SUTURE) ×1
SUT VIC AB 3-0 SH 27X BRD (SUTURE) ×1 IMPLANT
SUT VIC AB 4-0 FS2 27 (SUTURE) ×2 IMPLANT
SUTURE MNCRL 4-0 27XMF (SUTURE) ×1 IMPLANT
SYR 10ML LL (SYRINGE) ×2 IMPLANT
SYR 50ML LL SCALE MARK (SYRINGE) ×2 IMPLANT
WIRE Z .062 C-WIRE SPADE TIP (WIRE) IMPLANT

## 2019-01-18 NOTE — Anesthesia Procedure Notes (Signed)
Procedure Name: Intubation Performed by: Fredderick Phenix, CRNA Pre-anesthesia Checklist: Patient identified, Emergency Drugs available, Suction available and Patient being monitored Patient Re-evaluated:Patient Re-evaluated prior to induction Oxygen Delivery Method: Circle system utilized Preoxygenation: Pre-oxygenation with 100% oxygen Induction Type: IV induction Ventilation: Mask ventilation without difficulty Grade View: Grade II Tube type: Oral Tube size: 7.0 mm Number of attempts: 1 Airway Equipment and Method: Stylet and Oral airway Placement Confirmation: ETT inserted through vocal cords under direct vision,  positive ETCO2 and breath sounds checked- equal and bilateral Tube secured with: Tape Dental Injury: Teeth and Oropharynx as per pre-operative assessment

## 2019-01-18 NOTE — Anesthesia Post-op Follow-up Note (Signed)
Anesthesia QCDR form completed.        

## 2019-01-18 NOTE — Progress Notes (Signed)
Blood sugar 69. Patient took normal dose of insulin last night. Patient voices feeling shaky. Per Dr Ronelle Nigh verbal order for D5 0.9% normal saline bolus of 228mL.

## 2019-01-18 NOTE — H&P (Signed)
HISTORY AND PHYSICAL INTERVAL NOTE:  01/18/2019  10:19 AM  Vickie Montoya  has presented today for surgery, with the diagnosis of n/a.  The various methods of treatment have been discussed with the patient.  No guarantees were given.  After consideration of risks, benefits and other options for treatment, the patient has consented to surgery.  I have reviewed the patients' chart and labs.     A history and physical examination was performed in my office.  The patient was reexamined.  There have been no changes to this history and physical examination.  Vickie Montoya A

## 2019-01-18 NOTE — Discharge Instructions (Addendum)
       AMBULATORY SURGERY  DISCHARGE INSTRUCTIONS   1) The drugs that you were given will stay in your system until tomorrow so for the next 24 hours you should not:  A) Drive an automobile B) Make any legal decisions C) Drink any alcoholic beverage   2) You may resume regular meals tomorrow.  Today it is better to start with liquids and gradually work up to solid foods.  You may eat anything you prefer, but it is better to start with liquids, then soup and crackers, and gradually work up to solid foods.   3) Please notify your doctor immediately if you have any unusual bleeding, trouble breathing, redness and pain at the surgery site, drainage, fever, or pain not relieved by medication.    4) Additional Instructions:        Please contact your physician with any problems or Same Day Surgery at [phone], Monday through Friday 6 am to 4 pm, or [place] [facility] number at [phone].  [facility]  POST OPERATIVE INSTRUCTIONS FOR DR. TROXLER, DR. FOWLER, AND [facility]. [facility] PODIATRY DEPARTMENT   1. Take your medication as prescribed.  Pain medication should be taken only as needed.  2. Keep the dressing clean, dry and intact.  3. Keep your foot elevated above the heart level for the first 48 hours.  4. Walking to the bathroom and brief periods of walking are acceptable, unless we have instructed you to be non-weight bearing.  5. Always wear your post-op shoe when walking.  Always use your crutches if you are to be non-weight bearing.  6. Do not take a shower. Baths are permissible as long as the foot is kept out of the water.   7. Every hour you are awake:  - Bend your knee 15 times. - Flex foot 15 times - Massage calf 15 times  8. Call [facility] ([phone]) if any of the following problems occur: - You develop a temperature or fever. - The bandage becomes saturated with  blood. - Medication does not stop your pain. - Injury of the foot occurs. - Any symptoms of infection including redness, odor, or red streaks running from wound.  

## 2019-01-18 NOTE — Anesthesia Preprocedure Evaluation (Signed)
Anesthesia Evaluation  Patient identified by MRN, date of birth, ID band Patient awake    Reviewed: Allergy & Precautions, NPO status , Patient's Chart, lab work & pertinent test results, reviewed documented beta blocker date and time   History of Anesthesia Complications (+) PONV and history of anesthetic complications  Airway Mallampati: III       Dental  (+) Chipped, Poor Dentition   Pulmonary neg sleep apnea, neg COPD, Not current smoker,           Cardiovascular hypertension, Pt. on medications and Pt. on home beta blockers (-) Past MI and (-) CHF (-) dysrhythmias (-) Valvular Problems/Murmurs     Neuro/Psych neg Seizures Depression Legally blind    GI/Hepatic Neg liver ROS, PUD, Bowel prep,neg GERD  ,  Endo/Other  diabetes, Type 2, Oral Hypoglycemic AgentsMorbid obesity  Renal/GU Renal InsufficiencyRenal disease     Musculoskeletal   Abdominal   Peds  Hematology   Anesthesia Other Findings   Reproductive/Obstetrics                             Anesthesia Physical Anesthesia Plan  ASA: III  Anesthesia Plan: General   Post-op Pain Management:    Induction: Intravenous  PONV Risk Score and Plan: 3 and Ondansetron, Dexamethasone and Midazolam  Airway Management Planned: Oral ETT  Additional Equipment:   Intra-op Plan:   Post-operative Plan:   Informed Consent: I have reviewed the patients History and Physical, chart, labs and discussed the procedure including the risks, benefits and alternatives for the proposed anesthesia with the patient or authorized representative who has indicated his/her understanding and acceptance.       Plan Discussed with:   Anesthesia Plan Comments:         Anesthesia Quick Evaluation

## 2019-01-18 NOTE — Op Note (Signed)
Operative note   Surgeon:Curly Mackowski Lawyer: None    Preop diagnosis: Left fifth metatarsal fracture    Postop diagnosis: Same    Procedure: 1.  ORIF left fifth metatarsal fracture 2.  Intraoperative fluoroscopy left foot    EBL: Minimal    Anesthesia:local and general.  Local consisted of a one-to-one mixture of 0.5% Marcaine plain and Exparel long-acting anesthetic prior to surgery.  An additional 10 cc of Exparel was infiltrated at the end of the case.    Hemostasis: Ankle tourniquet inflated to 200 mmHg for 58 minutes    Specimen: None    Complications: None    Operative indications:Vickie Montoya is an 43 y.o. that presents today for surgical intervention.  The risks/benefits/alternatives/complications have been discussed and consent has been given.    Procedure:  Patient was brought into the OR and placed on the operating table in thesupine position. After anesthesia was obtained theleft lower extremity was prepped and draped in usual sterile fashion.  Attention was directed to the dorsal lateral aspect of the fifth metatarsal where incision was performed.  Sharp and blunt dissection carried down to the periosteum.  Subperiosteal dissection was then undertaken.  This exposed the fracture site.  This was irrigated.  Next bone reduction clamp was used to reduce the fracture into an anatomically aligned position.  The fracture did keep into a good position.  Next a 2.4 mm lag screw was placed from lateral to medial with good stabilization.  A 2.4 mm 6 hole plate was positioned dorsal lateral along the fifth metatarsal.  2 screws distal and 3 screws proximal were placed with good stability noted in all planes.  No motion was noted at the end of the case.  The wound was flushed with copious amounts of irrigation.  Layered closure was then performed with a 3-0 Vicryl for the deeper layers, 4-0 Vicryl in the subcutaneous tissue and a 4-0 Monocryl for the skin.  A bulky sterile  dressing was applied and the patient was then placed in the equalizer walker boot.  Fluoroscopy was used throughout the entire procedure.  This demonstrated good anatomically aligned reduction as well as stabilization of the fracture site.    Patient tolerated the procedure and anesthesia well.  Was transported from the OR to the PACU with all vital signs stable and vascular status intact. To be discharged per routine protocol.  Will follow up in approximately 1 week in the outpatient clinic.

## 2019-01-18 NOTE — Transfer of Care (Signed)
Immediate Anesthesia Transfer of Care Note  Patient: Vickie Montoya  Procedure(s) Performed: OPEN REDUCTION INTERNAL FIXATION (ORIF) METATARSAL (TOE) FRACTURE (Left Toe)  Patient Location: PACU  Anesthesia Type:General  Level of Consciousness: drowsy  Airway & Oxygen Therapy: Patient Spontanous Breathing and Patient connected to face mask oxygen  Post-op Assessment: Report given to RN and Post -op Vital signs reviewed and stable  Post vital signs: Reviewed and stable  Last Vitals:  Vitals Value Taken Time  BP 121/57 01/18/19 1241  Temp 36.3 C 01/18/19 1241  Pulse 66 01/18/19 1244  Resp 16 01/18/19 1244  SpO2 97 % 01/18/19 1244  Vitals shown include unvalidated device data.  Last Pain:  Vitals:   01/18/19 1241  TempSrc:   PainSc: 0-No pain         Complications: No apparent anesthesia complications

## 2019-01-22 NOTE — Anesthesia Postprocedure Evaluation (Signed)
Anesthesia Post Note  Patient: Vickie Montoya  Procedure(s) Performed: OPEN REDUCTION INTERNAL FIXATION (ORIF) METATARSAL (TOE) FRACTURE (Left Toe)  Patient location during evaluation: PACU Anesthesia Type: General Level of consciousness: awake and alert Pain management: pain level controlled Vital Signs Assessment: post-procedure vital signs reviewed and stable Respiratory status: spontaneous breathing, nonlabored ventilation and respiratory function stable Cardiovascular status: blood pressure returned to baseline and stable Postop Assessment: no apparent nausea or vomiting Anesthetic complications: no     Last Vitals:  Vitals:   01/18/19 1346 01/18/19 1453  BP: (!) 149/74 (!) 147/73  Pulse: 72 74  Resp: 18 20  Temp:    SpO2:  99%    Last Pain:  Vitals:   01/18/19 1453  TempSrc:   PainSc: [place]

## 2019-02-17 ENCOUNTER — Other Ambulatory Visit: Payer: Self-pay

## 2019-02-17 ENCOUNTER — Emergency Department
Admission: EM | Admit: 2019-02-17 | Discharge: 2019-02-17 | Disposition: A | Discharge status: Home / Self Care | Payer: BC Managed Care – PPO | Attending: Student in an Organized Health Care Education/Training Program | Admitting: Student in an Organized Health Care Education/Training Program

## 2019-02-17 ENCOUNTER — Emergency Department: Payer: BC Managed Care – PPO

## 2019-02-17 DIAGNOSIS — Z7984 Long term (current) use of oral hypoglycemic drugs: Secondary | ICD-10-CM | POA: Insufficient documentation

## 2019-02-17 DIAGNOSIS — E119 Type 2 diabetes mellitus without complications: Secondary | ICD-10-CM | POA: Insufficient documentation

## 2019-02-17 DIAGNOSIS — Z7982 Long term (current) use of aspirin: Secondary | ICD-10-CM | POA: Diagnosis not present

## 2019-02-17 DIAGNOSIS — R0602 Shortness of breath: Secondary | ICD-10-CM | POA: Diagnosis present

## 2019-02-17 DIAGNOSIS — Z79899 Other long term (current) drug therapy: Secondary | ICD-10-CM | POA: Insufficient documentation

## 2019-02-17 DIAGNOSIS — I10 Essential (primary) hypertension: Secondary | ICD-10-CM | POA: Insufficient documentation

## 2019-02-17 LAB — CBC WITH DIFFERENTIAL/PLATELET
Abs Immature Granulocytes: 0.04 10*3/uL (ref 0.00–0.07)
Basophils Absolute: 0.1 10*3/uL (ref 0.0–0.1)
Basophils Relative: 1 %
Eosinophils Absolute: 0.3 10*3/uL (ref 0.0–0.5)
Eosinophils Relative: 2 %
HCT: 30.3 % — ABNORMAL LOW (ref 36.0–46.0)
Hemoglobin: 9 g/dL — ABNORMAL LOW (ref 12.0–15.0)
Immature Granulocytes: 0 %
Lymphocytes Relative: 8 %
Lymphs Abs: 0.9 10*3/uL (ref 0.7–4.0)
MCH: 23.5 pg — ABNORMAL LOW (ref 26.0–34.0)
MCHC: 29.7 g/dL — ABNORMAL LOW (ref 30.0–36.0)
MCV: 79.1 fL — ABNORMAL LOW (ref 80.0–100.0)
Monocytes Absolute: 0.6 10*3/uL (ref 0.1–1.0)
Monocytes Relative: 5 %
Neutro Abs: 10.1 10*3/uL — ABNORMAL HIGH (ref 1.7–7.7)
Neutrophils Relative %: 84 %
Platelets: 402 10*3/uL — ABNORMAL HIGH (ref 150–400)
RBC: 3.83 MIL/uL — ABNORMAL LOW (ref 3.87–5.11)
RDW: 16.9 % — ABNORMAL HIGH (ref 11.5–15.5)
WBC: 12 10*3/uL — ABNORMAL HIGH (ref 4.0–10.5)
nRBC: 0 % (ref 0.0–0.2)

## 2019-02-17 LAB — TROPONIN I (HIGH SENSITIVITY)
Troponin I (High Sensitivity): 4 ng/L (ref <18)
Troponin I (High Sensitivity): 3 ng/L (ref <18)

## 2019-02-17 LAB — COMPREHENSIVE METABOLIC PANEL
ALT: 12 U/L (ref 0–44)
AST: 14 U/L — ABNORMAL LOW (ref 15–41)
Albumin: 3.4 g/dL — ABNORMAL LOW (ref 3.5–5.0)
Alkaline Phosphatase: 60 U/L (ref 38–126)
Anion gap: 10 (ref 5–15)
BUN: 28 mg/dL — ABNORMAL HIGH (ref 6–20)
CO2: 18 mmol/L — ABNORMAL LOW (ref 22–32)
Calcium: 8.9 mg/dL (ref 8.9–10.3)
Chloride: 109 mmol/L (ref 98–111)
Creatinine, Ser: 1.91 mg/dL — ABNORMAL HIGH (ref 0.44–1.00)
GFR calc Af Amer: 37 mL/min — ABNORMAL LOW (ref >60)
GFR calc non Af Amer: 32 mL/min — ABNORMAL LOW (ref >60)
Glucose, Bld: 127 mg/dL — ABNORMAL HIGH (ref 70–99)
Potassium: 4.7 mmol/L (ref 3.5–5.1)
Sodium: 137 mmol/L (ref 135–145)
Total Bilirubin: 0.5 mg/dL (ref 0.3–1.2)
Total Protein: 7.6 g/dL (ref 6.5–8.1)

## 2019-02-17 MED ORDER — ONDANSETRON HCL 4 MG/2ML IJ SOLN
INTRAMUSCULAR | Status: AC
  Administered 2019-02-17: 4 mg via INTRAVENOUS
  Filled 2019-02-17: qty 2

## 2019-02-17 MED ORDER — LORAZEPAM 1 MG PO TABS
1.0000 mg | ORAL_TABLET | Freq: Once | ORAL | Status: AC
  Administered 2019-02-17: 20:00:00 1 mg via ORAL
  Filled 2019-02-17: qty 1

## 2019-02-17 MED ORDER — ONDANSETRON HCL 4 MG/2ML IJ SOLN
4.0000 mg | Freq: Once | INTRAMUSCULAR | Status: AC
  Administered 2019-02-17: 20:00:00 4 mg via INTRAVENOUS

## 2019-02-17 NOTE — ED Triage Notes (Signed)
Pt states shob that began on Friday with rib pain yesterday. Pt reports dry cough, no fever. Pt with left leg surgery on 01/18/2019. Pt appears very anxious has a history of anxiety.

## 2019-02-17 NOTE — ED Provider Notes (Addendum)
[facility] Emergency Department Provider Note    First MD Initiated Contact with Patient 02/17/19 2015     (approximate)  I have reviewed the triage vital signs and the nursing notes.   HISTORY  Chief Complaint Shortness of Breath    HPI Vickie Montoya is a 43 y.o. female  Presents the ER for shortness of breath tearfulness.  Symptoms started this evening.  States that she feels like someone sitting on her chest.  Has had multiple episodes of this in the past.  Has had extensive cardiac work-up including recent use [place] visit with extensive work-up including negative CTA serial enzymes.  At that point felt to be secondary to anxiety.  Patient states he does have a history of depression has been under a lot more stress recently.  Denies any active pain at this time.   Past Medical History:  Diagnosis Date  . Blind   . Depression   . Diabetes mellitus without complication ([place])   . Hypertension   . Migraine    Family History  Problem Relation Age of Onset  . Hyperlipidemia Mother    Past Surgical History:  Procedure Laterality Date  . CESAREAN SECTION    . ORIF TOE FRACTURE Left 01/18/2019   Procedure: OPEN REDUCTION INTERNAL FIXATION (ORIF) METATARSAL (TOE) FRACTURE;  Surgeon: Samara Deist, DPM;  Location: [facility] ORS;  Service: Podiatry;  Laterality: Left;  . TUBAL LIGATION    . tubiligation     Patient Active Problem List   Diagnosis Date Noted  . Cellulitis and abscess 07/13/2015  . Abscess of lower back 07/02/2015      Prior to Admission medications   Medication Sig Start Date End Date Taking? Authorizing Provider  amLODipine (NORVASC) 10 MG tablet Take 10 mg by mouth daily.    [provider]  aspirin EC 81 MG tablet Take 81 mg by mouth at bedtime.     [provider]  carvedilol (COREG) 12.5 MG tablet Take 25 mg by mouth 2 (two) times daily. 12/11/18   [provider]  cetirizine (ZYRTEC) 10 MG tablet Take 10  mg by mouth daily.    [provider]  ferrous sulfate 325 (65 FE) MG tablet Take 325 mg by mouth daily with breakfast.    [provider]  hydrALAZINE (APRESOLINE) 50 MG tablet Take 50 mg by mouth every 8 (eight) hours. 11/17/18   [provider]  HYDROcodone-acetaminophen (NORCO) 5-325 MG tablet Take 1-2 tablets by mouth every 6 (six) hours as needed for moderate pain. 01/18/19   Samara Deist, DPM  insulin detemir (LEVEMIR) 100 UNIT/ML injection Inject 54 Units into the skin at bedtime.     [provider]  lisinopril (PRINIVIL,ZESTRIL) 20 MG tablet Take 20 mg by mouth daily.     [provider]  metFORMIN (GLUCOPHAGE) 500 MG tablet Take 500 mg by mouth 2 (two) times daily. 12/10/18   [provider]  ondansetron (ZOFRAN) 4 MG tablet Take 1 tablet (4 mg total) by mouth daily as needed for nausea or vomiting. 01/18/19 01/18/20  Samara Deist, DPM  pravastatin (PRAVACHOL) 10 MG tablet Take 10 mg by mouth at bedtime.     [provider]    Allergies Sitagliptin    Social History Social History   Tobacco Use  . Smoking status: Never Smoker  . Smokeless tobacco: Never Used  Substance Use Topics  . Alcohol use: No  . Drug use: Yes    Types:  Marijuana    Review of Systems Patient denies headaches, rhinorrhea, blurry vision, numbness, shortness of breath, chest pain, edema, cough, abdominal pain, nausea, vomiting, diarrhea, dysuria, fevers, rashes or hallucinations unless otherwise stated above in HPI. ____________________________________________   PHYSICAL EXAM:  VITAL SIGNS: Vitals:   02/17/19 2330 02/17/19 2347  BP: (!) 145/73   Pulse: 68 67  Resp:  16  Temp:  99 F (37.2 C)  SpO2:  94%    Constitutional: Alert and oriented anxious appearing Eyes: Conjunctivae are normal.  Head: Atraumatic. Nose: No congestion/rhinnorhea. Mouth/Throat: Mucous membranes are moist.   Neck: No stridor. Painless ROM.   Cardiovascular: Normal rate, regular rhythm. Grossly normal heart sounds.  Good peripheral circulation. Respiratory: Normal respiratory effort.  No retractions. Lungs CTAB. Gastrointestinal: Soft and nontender. No distention. No abdominal bruits. No CVA tenderness. Genitourinary:  Musculoskeletal: No lower extremity tenderness nor edema.  No joint effusions. Neurologic:  Normal speech and language. No gross focal neurologic deficits are appreciated. No facial droop Skin:  Skin is warm, dry and intact. No rash noted. Psychiatric very anxious, no SI or HI  ____________________________________________   LABS (all labs ordered are listed, but only abnormal results are displayed)  Results for orders placed or performed during the hospital encounter of 02/17/19 (from the past 24 hour(s))  CBC with Differential     Status: Abnormal   Collection Time: 02/17/19  7:38 PM  Result Value Ref Range   WBC 12.0 (H) 4.0 - 10.5 K/uL   RBC 3.83 (L) 3.87 - 5.11 MIL/uL   Hemoglobin 9.0 (L) 12.0 - 15.0 g/dL   HCT 16.1 (L) 09.6 - 04.5 %   MCV 79.1 (L) 80.0 - 100.0 fL   MCH 23.5 (L) 26.0 - 34.0 pg   MCHC 29.7 (L) 30.0 - 36.0 g/dL   RDW 40.9 (H) 81.1 - 91.4 %   Platelets 402 (H) 150 - 400 K/uL   nRBC 0.0 0.0 - 0.2 %   Neutrophils Relative % 84 %   Neutro Abs 10.1 (H) 1.7 - 7.7 K/uL   Lymphocytes Relative 8 %   Lymphs Abs 0.9 0.7 - 4.0 K/uL   Monocytes Relative 5 %   Monocytes Absolute 0.6 0.1 - 1.0 K/uL   Eosinophils Relative 2 %   Eosinophils Absolute 0.3 0.0 - 0.5 K/uL   Basophils Relative 1 %   Basophils Absolute 0.1 0.0 - 0.1 K/uL   Immature Granulocytes 0 %   Abs Immature Granulocytes 0.04 0.00 - 0.07 K/uL  Comprehensive metabolic panel     Status: Abnormal   Collection Time: 02/17/19  7:38 PM  Result Value Ref Range   Sodium 137 135 - 145 mmol/L   Potassium 4.7 3.5 - 5.1 mmol/L   Chloride 109 98 - 111 mmol/L   CO2 18 (L) 22 - 32 mmol/L   Glucose, Bld 127 (H) 70 - 99 mg/dL   BUN 28 (H) 6  - 20 mg/dL   Creatinine, Ser 7.82 (H) 0.44 - 1.00 mg/dL   Calcium 8.9 8.9 - 95.6 mg/dL   Total Protein 7.6 6.5 - 8.1 g/dL   Albumin 3.4 (L) 3.5 - 5.0 g/dL   AST 14 (L) 15 - 41 U/L   ALT 12 0 - 44 U/L   Alkaline Phosphatase 60 38 - 126 U/L   Total Bilirubin 0.5 0.3 - 1.2 mg/dL   GFR calc non Af Amer 32 (L) >60 mL/min   GFR calc Af Amer 37 (L) >60 mL/min   Anion  gap 10 5 - 15  Troponin I (High Sensitivity)     Status: None   Collection Time: 02/17/19  7:38 PM  Result Value Ref Range   Troponin I (High Sensitivity) 4 <18 ng/L  Troponin I (High Sensitivity)     Status: None   Collection Time: 02/17/19 10:04 PM  Result Value Ref Range   Troponin I (High Sensitivity) 3 <18 ng/L   ____________________________________________  EKG My review and personal interpretation at Time: 19:35   Indication: sob  Rate: 65  Rhythm: sinus Axis: normal Other: normal intervals, no stemi ____________________________________________  RADIOLOGY  I personally reviewed all radiographic images ordered to evaluate for the above acute complaints and reviewed radiology reports and findings.  These findings were personally discussed with the patient.  Please see medical record for radiology report.    EMERGENCY DEPARTMENT [place] CARDIAC EXAM "Study: Limited Ultrasound of the Heart and Pericardium"  INDICATIONS:Dyspnea Multiple views of the heart and pericardium were obtained in real-time with a multi-frequency probe.  PERFORMED UU:VOZDGU IMAGES ARCHIVED?: No LIMITATIONS:  None VIEWS USED: Subcostal 4 chamber, Parasternal long axis and Apical 4 chamber  INTERPRETATION: Cardiac activity present and Pericardial effusioin absent   ____________________________________________   PROCEDURES  Procedure(s) performed:  Procedures    Critical Care performed: no ____________________________________________   INITIAL IMPRESSION / ASSESSMENT AND PLAN / ED COURSE  Pertinent labs & imaging results that were  available during my care of the patient were reviewed by me and considered in my medical decision making (see chart for details).   DDX: Asthma, copd, CHF, pna, ptx, malignancy, Pe, anemia   MINH JASPER is a 43 y.o. who presents to the ED with symptoms as described above.  Patient very anxious appearing.  Blood work will be sent for the but differential.  Had extensive work-up just recently [facility].  I will lower suspicion for PE heart failure pneumonia or ACS but she does has risk factors therefore will observe in the ER.  Her abdominal exam is soft and benign.  Does not appear septic.  He satting well on room air.  On exam I do not appreciate any significant wheezing.  No rhonchi.  Clinical Course as of Feb 17 2351  Wynelle Link Feb 17, 2019  2119 Review of care everywhere blood work appears to be at its baseline.   [PR]  2159 Patient reassessed.  Feels significantly improved.  We will continue to monitor.   [PR]  2251 Patient's repeat troponin is stable.  She does not appear consistent with pneumonia.  Do not feel that repeat CT imaging clinically indicated given lack of hypoxia tachycardia and with normal CT just recently.  Given her borderline renal function do not want to give second dye load.  She is satting well on room air.  I think she is stable and appropriate for close outpatient follow-up.   [PR]    Clinical Course User Index [PR] Willy Eddy, MD    The patient was evaluated in Emergency Department today for the symptoms described in the history of present illness. He/she was evaluated in the context of the global COVID-19 pandemic, which necessitated consideration that the patient might be at risk for infection with the SARS-CoV-2 virus that causes COVID-19. Institutional protocols and algorithms that pertain to the evaluation of patients at risk for COVID-19 are in a state of rapid change based on information released by regulatory bodies including the CDC and federal and  state organizations. These policies and algorithms were  followed during the patient's care in the ED.  As part of my medical decision making, I reviewed the following data within the electronic MEDICAL RECORD NUMBER Nursing notes reviewed and incorporated, Labs reviewed, notes from prior ED visits and [place] Controlled Substance Database   ____________________________________________   FINAL CLINICAL IMPRESSION(S) / ED DIAGNOSES  Final diagnoses:  SOB (shortness of breath)      NEW MEDICATIONS STARTED DURING THIS VISIT:  Discharge Medication List as of 02/17/2019 11:36 PM       Note:  This document was prepared using Dragon voice recognition software and may include unintentional dictation errors.    Willy Eddyobinson, Shanquita Ronning, MD 02/17/19 2258    Willy Eddyobinson, Bryn Perkin, MD 02/17/19 2352

## 2019-04-29 DIAGNOSIS — R259 Unspecified abnormal involuntary movements: Secondary | ICD-10-CM | POA: Insufficient documentation

## 2019-04-29 DIAGNOSIS — F419 Anxiety disorder, unspecified: Secondary | ICD-10-CM | POA: Insufficient documentation

## 2019-10-24 DIAGNOSIS — G5139 Clonic hemifacial spasm, unspecified: Secondary | ICD-10-CM | POA: Insufficient documentation

## 2019-10-24 DIAGNOSIS — R251 Tremor, unspecified: Secondary | ICD-10-CM | POA: Insufficient documentation

## 2019-11-11 ENCOUNTER — Encounter: Payer: Self-pay | Admitting: Oncology

## 2019-11-11 NOTE — Progress Notes (Signed)
Patient prescreened for NP visit. No concerns voiced.

## 2019-11-12 ENCOUNTER — Encounter: Payer: Self-pay | Admitting: Oncology

## 2019-11-12 ENCOUNTER — Inpatient Hospital Stay: Payer: BC Managed Care – PPO

## 2019-11-12 ENCOUNTER — Other Ambulatory Visit: Payer: Self-pay

## 2019-11-12 ENCOUNTER — Inpatient Hospital Stay: Payer: BC Managed Care – PPO | Attending: Oncology | Admitting: Oncology

## 2019-11-12 VITALS — BP 134/97 | HR 74 | Temp 98.9°F | Resp 18 | Wt 244.7 lb

## 2019-11-12 DIAGNOSIS — Z833 Family history of diabetes mellitus: Secondary | ICD-10-CM | POA: Insufficient documentation

## 2019-11-12 DIAGNOSIS — R5383 Other fatigue: Secondary | ICD-10-CM | POA: Insufficient documentation

## 2019-11-12 DIAGNOSIS — R251 Tremor, unspecified: Secondary | ICD-10-CM | POA: Insufficient documentation

## 2019-11-12 DIAGNOSIS — Z801 Family history of malignant neoplasm of trachea, bronchus and lung: Secondary | ICD-10-CM | POA: Insufficient documentation

## 2019-11-12 DIAGNOSIS — Z8349 Family history of other endocrine, nutritional and metabolic diseases: Secondary | ICD-10-CM | POA: Insufficient documentation

## 2019-11-12 DIAGNOSIS — E538 Deficiency of other specified B group vitamins: Secondary | ICD-10-CM | POA: Insufficient documentation

## 2019-11-12 DIAGNOSIS — Z803 Family history of malignant neoplasm of breast: Secondary | ICD-10-CM | POA: Diagnosis not present

## 2019-11-12 DIAGNOSIS — Z79899 Other long term (current) drug therapy: Secondary | ICD-10-CM | POA: Diagnosis not present

## 2019-11-12 DIAGNOSIS — D509 Iron deficiency anemia, unspecified: Secondary | ICD-10-CM | POA: Diagnosis present

## 2019-11-12 DIAGNOSIS — E1122 Type 2 diabetes mellitus with diabetic chronic kidney disease: Secondary | ICD-10-CM

## 2019-11-12 DIAGNOSIS — N1832 Chronic kidney disease, stage 3b: Secondary | ICD-10-CM | POA: Insufficient documentation

## 2019-11-12 LAB — CBC WITH DIFFERENTIAL/PLATELET
Abs Immature Granulocytes: 0.02 10*3/uL (ref 0.00–0.07)
Basophils Absolute: 0.1 10*3/uL (ref 0.0–0.1)
Basophils Relative: 1 %
Eosinophils Absolute: 0.3 10*3/uL (ref 0.0–0.5)
Eosinophils Relative: 3 %
HCT: 30.8 % — ABNORMAL LOW (ref 36.0–46.0)
Hemoglobin: 9.9 g/dL — ABNORMAL LOW (ref 12.0–15.0)
Immature Granulocytes: 0 %
Lymphocytes Relative: 17 %
Lymphs Abs: 1.9 10*3/uL (ref 0.7–4.0)
MCH: 26.7 pg (ref 26.0–34.0)
MCHC: 32.1 g/dL (ref 30.0–36.0)
MCV: 83 fL (ref 80.0–100.0)
Monocytes Absolute: 0.7 10*3/uL (ref 0.1–1.0)
Monocytes Relative: 7 %
Neutro Abs: 8.3 10*3/uL — ABNORMAL HIGH (ref 1.7–7.7)
Neutrophils Relative %: 72 %
Platelets: 381 10*3/uL (ref 150–400)
RBC: 3.71 MIL/uL — ABNORMAL LOW (ref 3.87–5.11)
RDW: 17.2 % — ABNORMAL HIGH (ref 11.5–15.5)
WBC: 11.3 10*3/uL — ABNORMAL HIGH (ref 4.0–10.5)
nRBC: 0 % (ref 0.0–0.2)

## 2019-11-12 LAB — IRON AND TIBC
Iron: 37 ug/dL (ref 28–170)
Saturation Ratios: 11 % (ref 10.4–31.8)
TIBC: 339 ug/dL (ref 250–450)
UIBC: 302 ug/dL

## 2019-11-12 LAB — COMPREHENSIVE METABOLIC PANEL
ALT: 12 U/L (ref 0–44)
AST: 14 U/L — ABNORMAL LOW (ref 15–41)
Albumin: 3.3 g/dL — ABNORMAL LOW (ref 3.5–5.0)
Alkaline Phosphatase: 54 U/L (ref 38–126)
Anion gap: 5 (ref 5–15)
BUN: 32 mg/dL — ABNORMAL HIGH (ref 6–20)
CO2: 28 mmol/L (ref 22–32)
Calcium: 8.9 mg/dL (ref 8.9–10.3)
Chloride: 105 mmol/L (ref 98–111)
Creatinine, Ser: 1.86 mg/dL — ABNORMAL HIGH (ref 0.44–1.00)
GFR calc Af Amer: 38 mL/min — ABNORMAL LOW (ref >60)
GFR calc non Af Amer: 33 mL/min — ABNORMAL LOW (ref >60)
Glucose, Bld: 110 mg/dL — ABNORMAL HIGH (ref 70–99)
Potassium: 4.6 mmol/L (ref 3.5–5.1)
Sodium: 138 mmol/L (ref 135–145)
Total Bilirubin: 0.4 mg/dL (ref 0.3–1.2)
Total Protein: 7.6 g/dL (ref 6.5–8.1)

## 2019-11-12 LAB — RETIC PANEL
Immature Retic Fract: 9.5 % (ref 2.3–15.9)
RBC.: 3.75 MIL/uL — ABNORMAL LOW (ref 3.87–5.11)
Retic Count, Absolute: 46.1 10*3/uL (ref 19.0–186.0)
Retic Ct Pct: 1.2 % (ref 0.4–3.1)
Reticulocyte Hemoglobin: 29.8 pg (ref >27.9)

## 2019-11-12 LAB — FERRITIN: Ferritin: 10 ng/mL — ABNORMAL LOW (ref 11–307)

## 2019-11-12 MED ORDER — FERROUS SULFATE 325 (65 FE) MG PO TABS: 325.0000 mg | ORAL_TABLET | Freq: Two times a day (BID) | ORAL | 2 refills | Status: DC

## 2019-11-12 MED ORDER — VITAMIN C 250 MG PO TABS: 250.0000 mg | ORAL_TABLET | Freq: Every day | ORAL | 3 refills | Status: DC

## 2019-11-12 NOTE — Progress Notes (Signed)
Hematology/Oncology Consult note [facility] Telephone:([phone] [phone]   Patient Care Team: Jannetta Quint as PCP - General (Physician Assistant)  REFERRING PROVIDER: Naoma Diener, NP CHIEF COMPLAINTS/REASON FOR VISIT:  Evaluation of iron deficiency anemia  HISTORY OF PRESENTING ILLNESS:  Vickie Montoya is a  44 y.o.  female with PMH listed below was seen in consultation at the request of Naoma Diener, NP  for evaluation of iron deficiency anemia.   Reviewed patient's recent labs  10/24/2019, labs revealed anemia with hemoglobin of 10.3, WBC 13.5, platelet count 481, differential showed neutrophilia..  Ferritin 10. Reviewed patient's previous labs ordered by primary care physician's office, anemia is chronic onset , duration is since at least 2009.  Chronic decreased iron saturation and ferritin. No aggravating or improving factors. Patient follows up with neurology Dr. Malvin Johns for facial spasms, body jerking and tremors. Associated signs and symptoms: Patient reports fatigue.  Denies SOB with exertion.  Denies weight loss, easy bruising, hematochezia, hemoptysis, hematuria. Context:  History of iron deficiency: Patient has been on oral iron supplementation Rectal bleeding: Denies Menstrual bleeding/ Vaginal bleeding : Patient reports normal flow, usually last 3 days. hematemesis or hemoptysis : denies Blood in urine : denies      Review of Systems  Constitutional: Positive for fatigue.  Respiratory: Negative for cough and shortness of breath.   Cardiovascular: Negative for chest pain.  Gastrointestinal: Negative for abdominal pain.  Genitourinary: Negative for dysuria.   Musculoskeletal: Negative for back pain.  Neurological:       Body jerks patient spasms and tremors.  Psychiatric/Behavioral: Negative for confusion.    MEDICAL HISTORY:  Past Medical History:  Diagnosis Date   Blind    Depression    Diabetes  mellitus without complication (HCC)    Hypertension    Migraine     SURGICAL HISTORY: Past Surgical History:  Procedure Laterality Date   CESAREAN SECTION     ORIF TOE FRACTURE Left 01/18/2019   Procedure: OPEN REDUCTION INTERNAL FIXATION (ORIF) METATARSAL (TOE) FRACTURE;  Surgeon: Gwyneth Revels, DPM;  Location: [facility] ORS;  Service: Podiatry;  Laterality: Left;   TUBAL LIGATION     tubiligation      SOCIAL HISTORY: Social History   Socioeconomic History   Marital status: Married    Spouse name: Not on file   Number of children: Not on file   Years of education: Not on file   Highest education level: Not on file  Occupational History   Occupation: disabled   Tobacco Use   Smoking status: Never Smoker   Smokeless tobacco: Never Used  Building services engineer Use: Never used  Substance and Sexual Activity   Alcohol use: Yes    Comment: occasional    Drug use: Yes    Types: Marijuana    Comment: occasional    Sexual activity: Not on file  Other Topics Concern   Not on file  Social History Narrative   Not on file   Social Determinants of Health   Financial Resource Strain:    Difficulty of Paying Living Expenses:   Food Insecurity:    Worried About Programme researcher, broadcasting/film/video in the Last Year:    Barista in the Last Year:   Transportation Needs:    Freight forwarder (Medical):    Lack of Transportation (Non-Medical):   Physical Activity:    Days of Exercise per Week:    Minutes of Exercise per  Session:   Stress:    Feeling of Stress :   Social Connections:    Frequency of Communication with Friends and Family:    Frequency of Social Gatherings with Friends and Family:    Attends Religious Services:    Active Member of Clubs or Organizations:    Attends Engineer, structural:    Marital Status:   Intimate Partner Violence:    Fear of Current or Ex-Partner:    Emotionally Abused:    Physically Abused:     Sexually Abused:     FAMILY HISTORY: Family History  Problem Relation Age of Onset   Hyperlipidemia Mother    Diabetes Father    Breast cancer Maternal Aunt    Lung cancer Maternal Uncle     ALLERGIES:  is allergic to sitagliptin.  MEDICATIONS:  Current Outpatient Medications  Medication Sig Dispense Refill   amLODipine (NORVASC) 10 MG tablet Take 10 mg by mouth daily.     aspirin EC 81 MG tablet Take 81 mg by mouth at bedtime.      carvedilol (COREG) 12.5 MG tablet Take 25 mg by mouth 2 (two) times daily.     cetirizine (ZYRTEC) 10 MG tablet Take 10 mg by mouth daily.     Dulaglutide 0.75 MG/0.5ML SOPN Inject into the skin. Inject 0.5 mLs (0.75 mg total) subcutaneously once a week for 30 days     ferrous sulfate 325 (65 FE) MG tablet Take 325 mg by mouth daily with breakfast.     hydrALAZINE (APRESOLINE) 50 MG tablet Take 50 mg by mouth every 8 (eight) hours.     insulin detemir (LEVEMIR) 100 UNIT/ML injection Inject 54 Units into the skin at bedtime.      lisinopril (PRINIVIL,ZESTRIL) 20 MG tablet Take 20 mg by mouth daily.      ondansetron (ZOFRAN) 4 MG tablet Take 1 tablet (4 mg total) by mouth daily as needed for nausea or vomiting. 30 tablet 1   pravastatin (PRAVACHOL) 10 MG tablet Take 10 mg by mouth at bedtime.      HYDROcodone-acetaminophen (NORCO) 5-325 MG tablet Take 1-2 tablets by mouth every 6 (six) hours as needed for moderate pain. (Patient not taking: Reported on 11/11/2019) 30 tablet 0   metFORMIN (GLUCOPHAGE) 500 MG tablet Take 500 mg by mouth 2 (two) times daily. (Patient not taking: Reported on 11/11/2019)     No current facility-administered medications for this visit.     PHYSICAL EXAMINATION: ECOG PERFORMANCE STATUS: 2 - Symptomatic, <50% confined to bed Vitals:   11/12/19 1453  BP: (!) 134/97  Pulse: 74  Resp: 18  Temp: 98.9 F (37.2 C)   Filed Weights   11/12/19 1453  Weight: 244 lb 11.2 oz (111 kg)    Physical  Exam Constitutional:      General: She is not in acute distress.    Comments: Patient states in the wheelchair  HENT:     Head: Normocephalic and atraumatic.  Eyes:     Comments: Not able to open her eyes.  Cardiovascular:     Rate and Rhythm: Normal rate and regular rhythm.     Heart sounds: Normal heart sounds.  Pulmonary:     Effort: Pulmonary effort is normal. No respiratory distress.     Breath sounds: No wheezing.  Abdominal:     General: Bowel sounds are normal. There is no distension.  Musculoskeletal:        General: No deformity. Normal range of motion.  Cervical back: Normal range of motion and neck supple.  Skin:    General: Skin is warm and dry.     Findings: No erythema or rash.  Neurological:     Mental Status: She is alert. Mental status is at baseline.     Cranial Nerves: No cranial nerve deficit.     Coordination: Coordination normal.     Comments: Constant body jerks, grimace, movement  Psychiatric:        Mood and Affect: Mood normal.       CMP Latest Ref Rng & Units 11/12/2019  Glucose 70 - 99 mg/dL 161(W110(H)  BUN 6 - 20 mg/dL 96(E32(H)  Creatinine 4.540.44 - 1.00 mg/dL 0.98(J1.86(H)  Sodium 191135 - 478145 mmol/L 138  Potassium 3.5 - 5.1 mmol/L 4.6  Chloride 98 - 111 mmol/L 105  CO2 22 - 32 mmol/L 28  Calcium 8.9 - 10.3 mg/dL 8.9  Total Protein 6.5 - 8.1 g/dL 7.6  Total Bilirubin 0.3 - 1.2 mg/dL 0.4  Alkaline Phos 38 - 126 U/L 54  AST 15 - 41 U/L 14(L)  ALT 0 - 44 U/L 12   CBC Latest Ref Rng & Units 11/12/2019  WBC 4.0 - 10.5 K/uL 11.3(H)  Hemoglobin 12.0 - 15.0 g/dL 2.9(F9.9(L)  Hematocrit 36 - 46 % 30.8(L)  Platelets 150 - 400 K/uL 381     LABORATORY DATA:  I have reviewed the data as listed Lab Results  Component Value Date   WBC 11.3 (H) 11/12/2019   HGB 9.9 (L) 11/12/2019   HCT 30.8 (L) 11/12/2019   MCV 83.0 11/12/2019   PLT 381 11/12/2019   Recent Labs    02/17/19 1938 11/12/19 1604  NA 137 138  K 4.7 4.6  CL 109 105  CO2 18* 28  GLUCOSE  127* 110*  BUN 28* 32*  CREATININE 1.91* 1.86*  CALCIUM 8.9 8.9  GFRNONAA 32* 33*  GFRAA 37* 38*  PROT 7.6 7.6  ALBUMIN 3.4* 3.3*  AST 14* 14*  ALT 12 12  ALKPHOS 60 54  BILITOT 0.5 0.4   Iron/TIBC/Ferritin/ %Sat    Component Value Date/Time   IRON 37 11/12/2019 1604   TIBC 339 11/12/2019 1604   FERRITIN 10 (L) 11/12/2019 1604   IRONPCTSAT 11 11/12/2019 1604     RADIOGRAPHIC STUDIES: I have personally reviewed the radiological images as listed and agreed with the findings in the report. No results found.     ASSESSMENT & PLAN:  1. Iron deficiency anemia, unspecified iron deficiency anemia type   2. Stage 3b chronic kidney disease   3. Vitamin B12 deficiency    Previsit blood work results were reviewed by me and discussed with patient and her husband. Chronic anemia, low ferritin consistent with iron deficiency anemia. I recommend to check for iron panel with iron, TIBC, ferritin, CBC, smear.  Reticulocyte panel, CMP.  Iron deficiency, discussed with patient and husband about pros and cons of oral iron supplementation as well as IV iron treatments.  Potential rationale and side effects of iron infusion treatments were discussed with patient. They agree with starting with oral iron supplementation.  She is currently on oral ferrous sulfate 325 mg daily.  I recommend patient to increase to ferrous sulfate 325 mg twice daily can be added vitamin C.  Prescription was sent to pharmacy.  CKD, creatinine 1.86, at her baseline. Avoid nephrotoxins.  Check SPEP in the future.  Constant body jerks, grimace, etiology is unknown. Follow-up with neurology.  Vitamin B12 deficiency, patient  gets vitamin B12 injections Orders Placed This Encounter  Procedures   CBC with Differential/Platelet    Standing Status:   Future    Number of Occurrences:   1    Standing Expiration Date:   11/11/2020   Comprehensive metabolic panel    Standing Status:   Future    Number of Occurrences:    1    Standing Expiration Date:   11/11/2020   Ferritin    Standing Status:   Future    Number of Occurrences:   1    Standing Expiration Date:   11/11/2020   Iron and TIBC    Standing Status:   Future    Number of Occurrences:   1    Standing Expiration Date:   11/11/2020   Retic Panel    Standing Status:   Future    Number of Occurrences:   1    Standing Expiration Date:   11/11/2020    All questions were answered. The patient knows to call the clinic with any problems questions or concerns.  Cc Naoma Diener, NP  Return of visit: 3 months.  Thank you for this kind referral and the opportunity to participate in the care of this patient. A copy of today's note is routed to referring provider   Rickard Patience, MD, PhD Hematology Oncology [facility] 11/12/2019

## 2019-11-13 ENCOUNTER — Telehealth: Payer: Self-pay

## 2019-11-13 NOTE — Telephone Encounter (Signed)
Patient and husband both have been notified of results and have already picked up prescription. Also notified of future follow up plan.

## 2019-11-13 NOTE — Telephone Encounter (Signed)
-----   Message from Rickard Patience, MD sent at 11/12/2019 11:39 PM EDT ----- Please let her know that iron is confirmed to be low.  Recommend patient to take ferrous sulfate 325 mg twice daily.  Also take vitamin C daily.  Prescription have sent to patient's pharmacy. Please arrange patient to do a follow-up lab MD +/- Venofer in 2 months.  Labs 1 week prior.  Labs are ordered.

## 2019-11-13 NOTE — Telephone Encounter (Signed)
Done  Pts appt have been sched as requested I unable x2 to reach her by phone. A new appt letter will be mail out making her aware

## 2019-11-14 NOTE — Addendum Note (Signed)
Addended by: Rickard Patience on: 11/14/2019 09:30 PM   Modules accepted: Orders

## 2019-11-18 ENCOUNTER — Telehealth: Payer: Self-pay

## 2019-11-18 NOTE — Telephone Encounter (Signed)
-----   Message from Rickard Patience, MD sent at 11/14/2019  9:28 PM EDT ----- Please arrange her to do labs in 3 months, 1-2 days prior to MD +/- Venofer. Thanks.

## 2019-11-18 NOTE — Telephone Encounter (Signed)
Dr. Cathie Hoops, you had previously sent this result note message:   Please arrange patient to do a follow-up lab MD +/- Venofer in 2 months. Labs 1 week prior. Labs are ordered.  Appointments are scheduled:  Lab 9/10 Md/ Venofer 9/17.   Do you want these moved out to October (3 months) or keep as is?

## 2019-11-21 NOTE — Telephone Encounter (Signed)
please move out appt to 3 months (from first visit date) instead of 2 months. Appt will be:  lab/Md/ poss venofer with labs 1 week prior. Notify pt of appt changes please. thanks

## 2019-11-21 NOTE — Telephone Encounter (Signed)
Any updated on her RTC date?

## 2019-11-22 NOTE — Telephone Encounter (Signed)
Done.. Appts has been moved out to 01/2020 as requested. was unable to reach pt by phone. A NEW appt letter will to mailed out as well.

## 2020-01-10 ENCOUNTER — Other Ambulatory Visit: Payer: BC Managed Care – PPO

## 2020-01-17 ENCOUNTER — Ambulatory Visit: Payer: BC Managed Care – PPO | Admitting: Oncology

## 2020-01-17 ENCOUNTER — Ambulatory Visit: Payer: BC Managed Care – PPO

## 2020-02-03 ENCOUNTER — Other Ambulatory Visit: Payer: Self-pay | Admitting: Oncology

## 2020-02-10 ENCOUNTER — Other Ambulatory Visit: Payer: Self-pay

## 2020-02-10 ENCOUNTER — Inpatient Hospital Stay: Payer: BC Managed Care – PPO | Attending: Oncology

## 2020-02-10 DIAGNOSIS — Z79899 Other long term (current) drug therapy: Secondary | ICD-10-CM | POA: Diagnosis not present

## 2020-02-10 DIAGNOSIS — Z803 Family history of malignant neoplasm of breast: Secondary | ICD-10-CM | POA: Insufficient documentation

## 2020-02-10 DIAGNOSIS — R251 Tremor, unspecified: Secondary | ICD-10-CM | POA: Insufficient documentation

## 2020-02-10 DIAGNOSIS — Z801 Family history of malignant neoplasm of trachea, bronchus and lung: Secondary | ICD-10-CM | POA: Diagnosis not present

## 2020-02-10 DIAGNOSIS — D509 Iron deficiency anemia, unspecified: Secondary | ICD-10-CM | POA: Diagnosis present

## 2020-02-10 DIAGNOSIS — Z8349 Family history of other endocrine, nutritional and metabolic diseases: Secondary | ICD-10-CM | POA: Insufficient documentation

## 2020-02-10 DIAGNOSIS — R5383 Other fatigue: Secondary | ICD-10-CM | POA: Insufficient documentation

## 2020-02-10 DIAGNOSIS — E538 Deficiency of other specified B group vitamins: Secondary | ICD-10-CM | POA: Insufficient documentation

## 2020-02-10 DIAGNOSIS — N1832 Chronic kidney disease, stage 3b: Secondary | ICD-10-CM | POA: Diagnosis not present

## 2020-02-10 DIAGNOSIS — Z833 Family history of diabetes mellitus: Secondary | ICD-10-CM | POA: Diagnosis not present

## 2020-02-10 LAB — RETIC PANEL
Immature Retic Fract: 6.2 % (ref 2.3–15.9)
RBC.: 3.65 MIL/uL — ABNORMAL LOW (ref 3.87–5.11)
Retic Count, Absolute: 32.5 10*3/uL (ref 19.0–186.0)
Retic Ct Pct: 0.9 % (ref 0.4–3.1)
Reticulocyte Hemoglobin: 32.4 pg (ref >27.9)

## 2020-02-10 LAB — CBC WITH DIFFERENTIAL/PLATELET
Abs Immature Granulocytes: 0.11 10*3/uL — ABNORMAL HIGH (ref 0.00–0.07)
Basophils Absolute: 0.1 10*3/uL (ref 0.0–0.1)
Basophils Relative: 1 %
Eosinophils Absolute: 0.3 10*3/uL (ref 0.0–0.5)
Eosinophils Relative: 3 %
HCT: 32.9 % — ABNORMAL LOW (ref 36.0–46.0)
Hemoglobin: 10.5 g/dL — ABNORMAL LOW (ref 12.0–15.0)
Immature Granulocytes: 1 %
Lymphocytes Relative: 24 %
Lymphs Abs: 2.1 10*3/uL (ref 0.7–4.0)
MCH: 28.8 pg (ref 26.0–34.0)
MCHC: 31.9 g/dL (ref 30.0–36.0)
MCV: 90.4 fL (ref 80.0–100.0)
Monocytes Absolute: 0.6 10*3/uL (ref 0.1–1.0)
Monocytes Relative: 7 %
Neutro Abs: 5.6 10*3/uL (ref 1.7–7.7)
Neutrophils Relative %: 64 %
Platelets: 322 10*3/uL (ref 150–400)
RBC: 3.64 MIL/uL — ABNORMAL LOW (ref 3.87–5.11)
RDW: 16 % — ABNORMAL HIGH (ref 11.5–15.5)
WBC: 8.8 10*3/uL (ref 4.0–10.5)
nRBC: 0 % (ref 0.0–0.2)

## 2020-02-10 LAB — COMPREHENSIVE METABOLIC PANEL
ALT: 13 U/L (ref 0–44)
AST: 14 U/L — ABNORMAL LOW (ref 15–41)
Albumin: 3.4 g/dL — ABNORMAL LOW (ref 3.5–5.0)
Alkaline Phosphatase: 46 U/L (ref 38–126)
Anion gap: 6 (ref 5–15)
BUN: 22 mg/dL — ABNORMAL HIGH (ref 6–20)
CO2: 22 mmol/L (ref 22–32)
Calcium: 8.4 mg/dL — ABNORMAL LOW (ref 8.9–10.3)
Chloride: 112 mmol/L — ABNORMAL HIGH (ref 98–111)
Creatinine, Ser: 1.52 mg/dL — ABNORMAL HIGH (ref 0.44–1.00)
GFR, Estimated: 42 mL/min — ABNORMAL LOW (ref >60)
Glucose, Bld: 87 mg/dL (ref 70–99)
Potassium: 4.3 mmol/L (ref 3.5–5.1)
Sodium: 140 mmol/L (ref 135–145)
Total Bilirubin: 0.6 mg/dL (ref 0.3–1.2)
Total Protein: 7.4 g/dL (ref 6.5–8.1)

## 2020-02-10 LAB — TECHNOLOGIST SMEAR REVIEW: Plt Morphology: ADEQUATE

## 2020-02-10 LAB — IRON AND TIBC
Iron: 61 ug/dL (ref 28–170)
Saturation Ratios: 24 % (ref 10.4–31.8)
TIBC: 253 ug/dL (ref 250–450)
UIBC: 192 ug/dL

## 2020-02-10 LAB — FERRITIN: Ferritin: 17 ng/mL (ref 11–307)

## 2020-02-10 LAB — VITAMIN B12: Vitamin B-12: 492 pg/mL (ref 180–914)

## 2020-02-12 ENCOUNTER — Other Ambulatory Visit: Payer: Self-pay | Admitting: Oncology

## 2020-02-12 LAB — KAPPA/LAMBDA LIGHT CHAINS
Kappa free light chain: 108.5 mg/L — ABNORMAL HIGH (ref 3.3–19.4)
Kappa, lambda light chain ratio: 1.48 (ref 0.26–1.65)
Lambda free light chains: 73.4 mg/L — ABNORMAL HIGH (ref 5.7–26.3)

## 2020-02-13 LAB — MULTIPLE MYELOMA PANEL, SERUM
Albumin SerPl Elph-Mcnc: 3.2 g/dL (ref 2.9–4.4)
Albumin/Glob SerPl: 0.9 (ref 0.7–1.7)
Alpha 1: 0.2 g/dL (ref 0.0–0.4)
Alpha2 Glob SerPl Elph-Mcnc: 0.8 g/dL (ref 0.4–1.0)
B-Globulin SerPl Elph-Mcnc: 1.1 g/dL (ref 0.7–1.3)
Gamma Glob SerPl Elph-Mcnc: 1.4 g/dL (ref 0.4–1.8)
Globulin, Total: 3.6 g/dL (ref 2.2–3.9)
IgA: 661 mg/dL — ABNORMAL HIGH (ref 87–352)
IgG (Immunoglobin G), Serum: 1479 mg/dL (ref 586–1602)
IgM (Immunoglobulin M), Srm: 75 mg/dL (ref 26–217)
Total Protein ELP: 6.8 g/dL (ref 6.0–8.5)

## 2020-02-17 ENCOUNTER — Encounter: Payer: Self-pay | Admitting: Oncology

## 2020-02-17 ENCOUNTER — Inpatient Hospital Stay (HOSPITAL_BASED_OUTPATIENT_CLINIC_OR_DEPARTMENT_OTHER): Payer: BC Managed Care – PPO | Admitting: Oncology

## 2020-02-17 ENCOUNTER — Other Ambulatory Visit: Payer: Self-pay

## 2020-02-17 ENCOUNTER — Inpatient Hospital Stay: Payer: BC Managed Care – PPO

## 2020-02-17 VITALS — BP 113/75 | HR 89 | Temp 99.3°F | Resp 18 | Wt 221.2 lb

## 2020-02-17 VITALS — BP 93/60

## 2020-02-17 DIAGNOSIS — D509 Iron deficiency anemia, unspecified: Secondary | ICD-10-CM

## 2020-02-17 DIAGNOSIS — N1832 Chronic kidney disease, stage 3b: Secondary | ICD-10-CM | POA: Diagnosis not present

## 2020-02-17 MED ORDER — IRON SUCROSE 20 MG/ML IV SOLN
200.0000 mg | Freq: Once | INTRAVENOUS | Status: AC
  Administered 2020-02-17: 200 mg via INTRAVENOUS
  Filled 2020-02-17: qty 10

## 2020-02-17 MED ORDER — SODIUM CHLORIDE 0.9 % IV SOLN
Freq: Once | INTRAVENOUS | Status: AC
  Filled 2020-02-17: qty 250

## 2020-02-17 MED ORDER — SODIUM CHLORIDE 0.9 % IV SOLN: 200.0000 mg | Freq: Once | INTRAVENOUS | Status: DC

## 2020-02-17 NOTE — Progress Notes (Signed)
Hematology/Oncology Consult note [facility] [phone] [phone]   Patient Care Team: Jannetta Quint as PCP - General (Physician Assistant) Rickard Patience, MD as Consulting Physician (Hematology and Oncology)  REFERRING PROVIDER: Clent Jacks, PA-C CHIEF COMPLAINTS/REASON FOR VISIT:  Evaluation of iron deficiency anemia  HISTORY OF PRESENTING ILLNESS:  Vickie Montoya is a  44 y.o.  female with PMH listed below was seen in consultation at the request of Clent Jacks, [place]  for evaluation of iron deficiency anemia.   Reviewed patient's recent labs  10/24/2019, labs revealed anemia with hemoglobin of 10.3, WBC 13.5, platelet count 481, differential showed neutrophilia..  Ferritin 10. Reviewed patient's previous labs ordered by primary care physician's office, anemia is chronic onset , duration is since at least 2009.  Chronic decreased iron saturation and ferritin. No aggravating or improving factors. Patient follows up with neurology Dr. Malvin Johns for facial spasms, body jerking and tremors. Associated signs and symptoms: Patient reports fatigue.  Denies SOB with exertion.  Denies weight loss, easy bruising, hematochezia, hemoptysis, hematuria. Context:  History of iron deficiency: Patient has been on oral iron supplementation Rectal bleeding: Denies Menstrual bleeding/ Vaginal bleeding : Patient reports normal flow, usually last 3 days. hematemesis or hemoptysis : denies Blood in urine : denies   INTERVAL HISTORY Vickie Montoya is a 44 y.o. female who has above history reviewed by me today presents for follow up visit for management of anemia.  Problems and complaints are listed below: Patient has received IV Venofer treatments.  She reports the level has improved.  She was accompanied by her husband.  Chronic jerky movements and spasm.   Review of Systems  Constitutional: Negative for fatigue.  Respiratory: Negative for  cough and shortness of breath.   Cardiovascular: Negative for chest pain.  Gastrointestinal: Negative for abdominal pain.  Genitourinary: Negative for dysuria.   Musculoskeletal: Negative for back pain.  Neurological:       Body jerks patient spasms and tremors.  Psychiatric/Behavioral: Negative for confusion.    MEDICAL HISTORY:  Past Medical History:  Diagnosis Date  . Blind   . Depression   . Diabetes mellitus without complication (HCC)   . Hypertension   . Migraine     SURGICAL HISTORY: Past Surgical History:  Procedure Laterality Date  . CESAREAN SECTION    . ORIF TOE FRACTURE Left 01/18/2019   Procedure: OPEN REDUCTION INTERNAL FIXATION (ORIF) METATARSAL (TOE) FRACTURE;  Surgeon: Gwyneth Revels, DPM;  Location: [facility] ORS;  Service: Podiatry;  Laterality: Left;  . TUBAL LIGATION    . tubiligation      SOCIAL HISTORY: Social History   Socioeconomic History  . Marital status: Married    Spouse name: Not on file  . Number of children: Not on file  . Years of education: Not on file  . Highest education level: Not on file  Occupational History  . Occupation: disabled   Tobacco Use  . Smoking status: Never Smoker  . Smokeless tobacco: Never Used  Vaping Use  . Vaping Use: Never used  Substance and Sexual Activity  . Alcohol use: Yes    Comment: occasional   . Drug use: Yes    Types: Marijuana    Comment: occasional   . Sexual activity: Not on file  Other Topics Concern  . Not on file  Social History Narrative  . Not on file   Social Determinants of Health   Financial Resource Strain:   . Difficulty of Paying Living  Expenses: Not on file  Food Insecurity:   . Worried About Programme researcher, broadcasting/film/videounning Out of Food in the Last Year: Not on file  . Ran Out of Food in the Last Year: Not on file  Transportation Needs:   . Lack of Transportation (Medical): Not on file  . Lack of Transportation (Non-Medical): Not on file  Physical Activity:   . Days of Exercise per Week: Not on  file  . Minutes of Exercise per Session: Not on file  Stress:   . Feeling of Stress : Not on file  Social Connections:   . Frequency of Communication with Friends and Family: Not on file  . Frequency of Social Gatherings with Friends and Family: Not on file  . Attends Religious Services: Not on file  . Active Member of Clubs or Organizations: Not on file  . Attends BankerClub or Organization Meetings: Not on file  . Marital Status: Not on file  Intimate Partner Violence:   . Fear of Current or Ex-Partner: Not on file  . Emotionally Abused: Not on file  . Physically Abused: Not on file  . Sexually Abused: Not on file    FAMILY HISTORY: Family History  Problem Relation Age of Onset  . Hyperlipidemia Mother   . Diabetes Father   . Breast cancer Maternal Aunt   . Lung cancer Maternal Uncle     ALLERGIES:  is allergic to sitagliptin.  MEDICATIONS:  Current Outpatient Medications  Medication Sig Dispense Refill  . amLODipine (NORVASC) 10 MG tablet Take 10 mg by mouth daily.    Marland Kitchen. aspirin EC 81 MG tablet Take 81 mg by mouth at bedtime.     . carvedilol (COREG) 12.5 MG tablet Take 25 mg by mouth 2 (two) times daily.    . cetirizine (ZYRTEC) 10 MG tablet Take 10 mg by mouth daily.    . clonazePAM (KLONOPIN) 0.5 MG tablet Take 0.5 mg twice a day for two weeks then increase to 0.5 mg three times a day and continue that dose    . [employer] VITAMIN C 250 MG tablet TAKE 1 TABLET BY MOUTH EVERY DAY 90 tablet 1  . cyanocobalamin 1000 MCG tablet Take by mouth.    . Dulaglutide 0.75 MG/0.5ML SOPN Inject into the skin. Inject 0.5 mLs (0.75 mg total) subcutaneously once a week for 30 days    . escitalopram (LEXAPRO) 10 MG tablet Take by mouth.    . ferrous sulfate 325 (65 FE) MG tablet TAKE 1 TABLET (325 MG TOTAL) BY MOUTH 2 (TWO) TIMES DAILY WITH A MEAL. 180 tablet 0  . hydrALAZINE (APRESOLINE) 50 MG tablet Take 50 mg by mouth every 8 (eight) hours.    . insulin detemir (LEVEMIR) 100 UNIT/ML injection  Inject 54 Units into the skin at bedtime.     Marland Kitchen. lisinopril (PRINIVIL,ZESTRIL) 20 MG tablet Take 20 mg by mouth daily.     . pravastatin (PRAVACHOL) 10 MG tablet Take 10 mg by mouth at bedtime.     Marland Kitchen. QUEtiapine (SEROQUEL) 25 MG tablet Take 25 mg at night for a week then increase to 50 mg at night and continue that dose    . HYDROcodone-acetaminophen (NORCO) 5-325 MG tablet Take 1-2 tablets by mouth every 6 (six) hours as needed for moderate pain. (Patient not taking: Reported on 11/11/2019) 30 tablet 0  . metFORMIN (GLUCOPHAGE) 500 MG tablet Take 500 mg by mouth 2 (two) times daily. (Patient not taking: Reported on 11/11/2019)     No current facility-administered  medications for this visit.     PHYSICAL EXAMINATION: ECOG PERFORMANCE STATUS: 2 - Symptomatic, <50% confined to bed Vitals:   02/17/20 1313  BP: 113/75  Pulse: 89  Resp: 18  Temp: 99.3 F (37.4 C)   Filed Weights   02/17/20 1313  Weight: 221 lb 3.2 oz (100.3 kg)    Physical Exam Constitutional:      General: She is not in acute distress.    Comments: Patient states in the wheelchair  HENT:     Head: Normocephalic and atraumatic.  Eyes:     Comments: Not able to open her eyes.  Cardiovascular:     Rate and Rhythm: Normal rate and regular rhythm.     Heart sounds: Normal heart sounds.  Pulmonary:     Effort: Pulmonary effort is normal. No respiratory distress.     Breath sounds: No wheezing.  Abdominal:     General: Bowel sounds are normal. There is no distension.  Musculoskeletal:        General: No deformity. Normal range of motion.     Cervical back: Normal range of motion and neck supple.  Skin:    General: Skin is warm and dry.     Findings: No erythema or rash.  Neurological:     Mental Status: She is alert. Mental status is at baseline.     Cranial Nerves: No cranial nerve deficit.     Coordination: Coordination normal.     Comments: Constant body jerks, grimace, movement  Psychiatric:        Mood and  Affect: Mood normal.       CMP Latest Ref Rng & Units 02/10/2020  Glucose 70 - 99 mg/dL 87  BUN 6 - 20 mg/dL 54(O)  Creatinine 2.70 - 1.00 mg/dL 3.50(K)  Sodium 938 - 182 mmol/L 140  Potassium 3.5 - 5.1 mmol/L 4.3  Chloride 98 - 111 mmol/L 112(H)  CO2 22 - 32 mmol/L 22  Calcium 8.9 - 10.3 mg/dL 9.9(B)  Total Protein 6.5 - 8.1 g/dL 7.4  Total Bilirubin 0.3 - 1.2 mg/dL 0.6  Alkaline Phos 38 - 126 U/L 46  AST 15 - 41 U/L 14(L)  ALT 0 - 44 U/L 13   CBC Latest Ref Rng & Units 02/10/2020  WBC 4.0 - 10.5 K/uL 8.8  Hemoglobin 12.0 - 15.0 g/dL 10.5(L)  Hematocrit 36 - 46 % 32.9(L)  Platelets 150 - 400 K/uL 322     LABORATORY DATA:  I have reviewed the data as listed Lab Results  Component Value Date   WBC 8.8 02/10/2020   HGB 10.5 (L) 02/10/2020   HCT 32.9 (L) 02/10/2020   MCV 90.4 02/10/2020   PLT 322 02/10/2020   Recent Labs    11/12/19 1604 02/10/20 1331  NA 138 140  K 4.6 4.3  CL 105 112*  CO2 28 22  GLUCOSE 110* 87  BUN 32* 22*  CREATININE 1.86* 1.52*  CALCIUM 8.9 8.4*  GFRNONAA 33* 42*  GFRAA 38*  --   PROT 7.6 7.4  ALBUMIN 3.3* 3.4*  AST 14* 14*  ALT 12 13  ALKPHOS 54 46  BILITOT 0.4 0.6   Iron/TIBC/Ferritin/ %Sat    Component Value Date/Time   IRON 61 02/10/2020 1331   TIBC 253 02/10/2020 1331   FERRITIN 17 02/10/2020 1331   IRONPCTSAT 24 02/10/2020 1331     RADIOGRAPHIC STUDIES: I have personally reviewed the radiological images as listed and agreed with the findings in the report. No results  found.     ASSESSMENT & PLAN:  1. Iron deficiency anemia, unspecified iron deficiency anemia type   2. Stage 3b chronic kidney disease (HCC)    # Iron deficiency anemia, Labs are reviewed and discussed with patient. Globin has improved.  Iron panel shows improved iron stores but still room to improve especially in the context of CKD. Recommend patient to receive another IV Venofer today.  Continue oral iron supplementation. Follow-up in 3  months.   CKD, SPEP showed no M protein.  Avoid nephrotoxin.  Constant body jerks, grimace, etiology is unknown. Follow-up with neurology.  Vitamin B12 deficiency, patient gets vitamin B12 injections Orders Placed This Encounter  Procedures  . CBC with Differential/Platelet    Standing Status:   Future    Standing Expiration Date:   02/16/2021  . Ferritin    Standing Status:   Future    Standing Expiration Date:   02/16/2021  . Iron and TIBC    Standing Status:   Future    Standing Expiration Date:   02/16/2021    All questions were answered. The patient knows to call the clinic with any problems questions or concerns.  Cc Clent Jacks, PA-C  Return of visit: 3 months.    Rickard Patience, MD, PhD Hematology Oncology [facility] 02/17/2020

## 2020-02-17 NOTE — Progress Notes (Signed)
Pt here for follow up. No new concerns voiced.   

## 2020-03-08 ENCOUNTER — Other Ambulatory Visit: Payer: Self-pay | Admitting: Oncology

## 2020-05-11 ENCOUNTER — Other Ambulatory Visit: Payer: Self-pay | Admitting: Oncology

## 2020-05-18 ENCOUNTER — Telehealth: Payer: Self-pay | Admitting: Oncology

## 2020-05-18 ENCOUNTER — Inpatient Hospital Stay: Payer: BC Managed Care – PPO

## 2020-05-18 NOTE — Telephone Encounter (Signed)
Done.. Pt appt were cx and R/S per pts husband request

## 2020-05-18 NOTE — Telephone Encounter (Signed)
Patients husband Vickie Montoya called to reschedule lab appointment for today to Friday.  He is willing to do a virtual visit with Dr. Cathie Hoops any day after 3 (send link to his cell phone [phone]).  And they can then determine how to proceed with scheduling her infusions if needed.  Routing to scheduler for follow up.

## 2020-05-20 ENCOUNTER — Inpatient Hospital Stay: Payer: BC Managed Care – PPO

## 2020-05-20 ENCOUNTER — Inpatient Hospital Stay: Payer: BC Managed Care – PPO | Admitting: Oncology

## 2020-05-22 ENCOUNTER — Inpatient Hospital Stay: Payer: BC Managed Care – PPO

## 2020-05-22 ENCOUNTER — Telehealth: Payer: Self-pay | Admitting: Oncology

## 2020-05-22 NOTE — Telephone Encounter (Signed)
Vickie Montoya, please call pt to r/s lab appt. If she can come Monday morning for labs, she can keep mychart visit in the afternoon on 1/24 and infusion on 1/25.

## 2020-05-22 NOTE — Telephone Encounter (Signed)
05/22/2020 Spoke with pts husband, moved missed lab appt from today to 05/25/20 @ 8:45. He confirmed this change but said he needs to speak to his boss about the time, and will call back if this will not work [employer]

## 2020-05-25 ENCOUNTER — Inpatient Hospital Stay: Payer: BC Managed Care – PPO | Attending: Oncology | Admitting: Oncology

## 2020-05-25 ENCOUNTER — Inpatient Hospital Stay: Payer: BC Managed Care – PPO

## 2020-05-25 ENCOUNTER — Encounter: Payer: Self-pay | Admitting: Oncology

## 2020-05-25 DIAGNOSIS — Z79899 Other long term (current) drug therapy: Secondary | ICD-10-CM | POA: Diagnosis not present

## 2020-05-25 DIAGNOSIS — N1832 Chronic kidney disease, stage 3b: Secondary | ICD-10-CM | POA: Insufficient documentation

## 2020-05-25 DIAGNOSIS — F1290 Cannabis use, unspecified, uncomplicated: Secondary | ICD-10-CM | POA: Diagnosis not present

## 2020-05-25 DIAGNOSIS — E1122 Type 2 diabetes mellitus with diabetic chronic kidney disease: Secondary | ICD-10-CM | POA: Diagnosis not present

## 2020-05-25 DIAGNOSIS — R5383 Other fatigue: Secondary | ICD-10-CM | POA: Diagnosis not present

## 2020-05-25 DIAGNOSIS — D631 Anemia in chronic kidney disease: Secondary | ICD-10-CM | POA: Diagnosis not present

## 2020-05-25 DIAGNOSIS — D509 Iron deficiency anemia, unspecified: Secondary | ICD-10-CM

## 2020-05-25 LAB — IRON AND TIBC
Iron: 43 ug/dL (ref 28–170)
Saturation Ratios: 18 % (ref 10.4–31.8)
TIBC: 237 ug/dL — ABNORMAL LOW (ref 250–450)
UIBC: 194 ug/dL

## 2020-05-25 LAB — CBC WITH DIFFERENTIAL/PLATELET
Abs Immature Granulocytes: 0.03 10*3/uL (ref 0.00–0.07)
Basophils Absolute: 0.1 10*3/uL (ref 0.0–0.1)
Basophils Relative: 1 %
Eosinophils Absolute: 0.3 10*3/uL (ref 0.0–0.5)
Eosinophils Relative: 4 %
HCT: 32 % — ABNORMAL LOW (ref 36.0–46.0)
Hemoglobin: 10.3 g/dL — ABNORMAL LOW (ref 12.0–15.0)
Immature Granulocytes: 0 %
Lymphocytes Relative: 24 %
Lymphs Abs: 2 10*3/uL (ref 0.7–4.0)
MCH: 30.6 pg (ref 26.0–34.0)
MCHC: 32.2 g/dL (ref 30.0–36.0)
MCV: 95 fL (ref 80.0–100.0)
Monocytes Absolute: 0.8 10*3/uL (ref 0.1–1.0)
Monocytes Relative: 10 %
Neutro Abs: 5.4 10*3/uL (ref 1.7–7.7)
Neutrophils Relative %: 61 %
Platelets: 299 10*3/uL (ref 150–400)
RBC: 3.37 MIL/uL — ABNORMAL LOW (ref 3.87–5.11)
RDW: 12.8 % (ref 11.5–15.5)
WBC: 8.6 10*3/uL (ref 4.0–10.5)
nRBC: 0 % (ref 0.0–0.2)

## 2020-05-25 LAB — FERRITIN: Ferritin: 22 ng/mL (ref 11–307)

## 2020-05-25 NOTE — Progress Notes (Signed)
HEMATOLOGY-ONCOLOGY TeleHEALTH VISIT PROGRESS NOTE  I connected with Vickie Montoya on 05/25/20  at  2:15 PM EST by video enabled telemedicine visit and verified that I am speaking with the correct person using two identifiers. I discussed the limitations, risks, security and privacy concerns of performing an evaluation and management service by telemedicine and the availability of in-person appointments. The patient expressed understanding and agreed to proceed.   Other persons participating in the visit and their role in the encounter:  None  Patient's location: Home  Provider's location: office Chief Complaint: Follow-up for anemia   INTERVAL HISTORY Vickie Montoya is a 45 y.o. female who has above history reviewed by me today presents for follow up visit for management of anemia Problems and complaints are listed below:  Patient has received IV Venofer treatments previously and tolerated well.  She takes oral iron supplementation twice daily. She denies any new complaints.  Fatigue level is at baseline.  Not worse.  Review of Systems  Constitutional: Positive for fatigue. Negative for appetite change, chills and fever.  HENT:   Negative for hearing loss and voice change.   Eyes: Negative for eye problems.  Respiratory: Negative for chest tightness and cough.   Cardiovascular: Negative for chest pain.  Gastrointestinal: Negative for abdominal distention, abdominal pain and blood in stool.  Endocrine: Negative for hot flashes.  Genitourinary: Negative for difficulty urinating and frequency.   Musculoskeletal: Negative for arthralgias.       Body jerks patient spasms and tremors  Skin: Negative for itching and rash.  Neurological: Negative for extremity weakness.  Hematological: Negative for adenopathy.  Psychiatric/Behavioral: Negative for confusion.    Past Medical History:  Diagnosis Date  . Blind   . Depression   . Diabetes mellitus without complication (HCC)   .  Hypertension   . Migraine    Past Surgical History:  Procedure Laterality Date  . CESAREAN SECTION    . ORIF TOE FRACTURE Left 01/18/2019   Procedure: OPEN REDUCTION INTERNAL FIXATION (ORIF) METATARSAL (TOE) FRACTURE;  Surgeon: Gwyneth Revels, DPM;  Location: [facility] ORS;  Service: Podiatry;  Laterality: Left;  . TUBAL LIGATION    . tubiligation      Family History  Problem Relation Age of Onset  . Hyperlipidemia Mother   . Diabetes Father   . Breast cancer Maternal Aunt   . Lung cancer Maternal Uncle     Social History   Socioeconomic History  . Marital status: Married    Spouse name: Not on file  . Number of children: Not on file  . Years of education: Not on file  . Highest education level: Not on file  Occupational History  . Occupation: disabled   Tobacco Use  . Smoking status: Never Smoker  . Smokeless tobacco: Never Used  Vaping Use  . Vaping Use: Never used  Substance and Sexual Activity  . Alcohol use: Yes    Comment: occasional   . Drug use: Yes    Types: Marijuana    Comment: occasional   . Sexual activity: Not on file  Other Topics Concern  . Not on file  Social History Narrative  . Not on file   Social Determinants of Health   Financial Resource Strain: Not on file  Food Insecurity: Not on file  Transportation Needs: Not on file  Physical Activity: Not on file  Stress: Not on file  Social Connections: Not on file  Intimate Partner Violence: Not on file    Current  Outpatient Medications on File Prior to Visit  Medication Sig Dispense Refill  . amLODipine (NORVASC) 10 MG tablet Take 10 mg by mouth daily.    Marland Kitchen aspirin EC 81 MG tablet Take 81 mg by mouth at bedtime.     . carvedilol (COREG) 12.5 MG tablet Take 25 mg by mouth 2 (two) times daily.    . cetirizine (ZYRTEC) 10 MG tablet Take 10 mg by mouth daily.    . [employer] VITAMIN C 250 MG tablet TAKE 1 TABLET BY MOUTH EVERY DAY 90 tablet 1  . cyanocobalamin 1000 MCG tablet Take by mouth.    .  Dulaglutide 0.75 MG/0.5ML SOPN Inject into the skin. Inject 0.5 mLs (0.75 mg total) subcutaneously once a week for 30 days    . escitalopram (LEXAPRO) 10 MG tablet Take by mouth.    . ferrous sulfate 325 (65 FE) MG tablet TAKE 1 TABLET (325 MG TOTAL) BY MOUTH 2 (TWO) TIMES DAILY WITH A MEAL. 180 tablet 0  . hydrALAZINE (APRESOLINE) 50 MG tablet Take 50 mg by mouth every 8 (eight) hours.    Marland Kitchen HYDROcodone-acetaminophen (NORCO) 5-325 MG tablet Take 1-2 tablets by mouth every 6 (six) hours as needed for moderate pain. 30 tablet 0  . insulin detemir (LEVEMIR) 100 UNIT/ML injection Inject 54 Units into the skin at bedtime.     Marland Kitchen lisinopril (PRINIVIL,ZESTRIL) 20 MG tablet Take 20 mg by mouth daily.     . pravastatin (PRAVACHOL) 10 MG tablet Take 10 mg by mouth at bedtime.     Marland Kitchen QUEtiapine (SEROQUEL) 25 MG tablet Take 25 mg at night for a week then increase to 50 mg at night and continue that dose    . clonazePAM (KLONOPIN) 0.5 MG tablet Take 0.5 mg twice a day for two weeks then increase to 0.5 mg three times a day and continue that dose (Patient not taking: Reported on 05/25/2020)     No current facility-administered medications on file prior to visit.    Allergies  Allergen Reactions  . Sitagliptin Other (See Comments)    Hyperglycemia  Jardience        Observations/Objective: Today's Vitals   05/25/20 1451  PainSc: 0-No pain   There is no height or weight on file to calculate BMI.  Physical Exam Neurological:     Mental Status: She is alert.     CBC    Component Value Date/Time   WBC 8.6 05/25/2020 0941   RBC 3.37 (L) 05/25/2020 0941   HGB 10.3 (L) 05/25/2020 0941   HGB 13.0 12/29/2013 1456   HCT 32.0 (L) 05/25/2020 0941   HCT 39.8 12/29/2013 1456   PLT 299 05/25/2020 0941   PLT 231 12/29/2013 1456   MCV 95.0 05/25/2020 0941   MCV 89 12/29/2013 1456   MCH 30.6 05/25/2020 0941   MCHC 32.2 05/25/2020 0941   RDW 12.8 05/25/2020 0941   RDW 12.9 12/29/2013 1456   LYMPHSABS  2.0 05/25/2020 0941   MONOABS 0.8 05/25/2020 0941   EOSABS 0.3 05/25/2020 0941   BASOSABS 0.1 05/25/2020 0941    CMP     Component Value Date/Time   NA 140 02/10/2020 1331   NA 139 12/29/2013 1456   K 4.3 02/10/2020 1331   K 3.9 12/29/2013 1456   CL 112 (H) 02/10/2020 1331   CL 106 12/29/2013 1456   CO2 22 02/10/2020 1331   CO2 24 12/29/2013 1456   GLUCOSE 87 02/10/2020 1331   GLUCOSE 384 (H) 12/29/2013 1456  BUN 22 (H) 02/10/2020 1331   BUN 9 12/29/2013 1456   CREATININE 1.52 (H) 02/10/2020 1331   CREATININE 0.94 12/29/2013 1456   CALCIUM 8.4 (L) 02/10/2020 1331   CALCIUM 8.5 12/29/2013 1456   PROT 7.4 02/10/2020 1331   ALBUMIN 3.4 (L) 02/10/2020 1331   AST 14 (L) 02/10/2020 1331   ALT 13 02/10/2020 1331   ALKPHOS 46 02/10/2020 1331   BILITOT 0.6 02/10/2020 1331   GFRNONAA 42 (L) 02/10/2020 1331   GFRNONAA >60 12/29/2013 1456   GFRAA 38 (L) 11/12/2019 1604   GFRAA >60 12/29/2013 1456     Assessment and Plan: 1. Stage 3b chronic kidney disease (HCC)   2. Anemia due to stage 3b chronic kidney disease (HCC)     Anemia; secondary to chronic kidney disease. Labs reviewed and discussed with patient. Iron panel is stable.  In the context of chronic kidney disease, recommend patient to further improve her iron store. Patient has been oral iron supplementation.  Ferritin remains at 22 and iron saturation 18.  I recommend patient to proceed with IV Venofer infusion tomorrow, repeat another dose in 2 weeks.  Chronic kidney disease, avoid nephrotoxins.   Follow Up Instructions: 3 months   I discussed the assessment and treatment plan with the patient. The patient was provided an opportunity to ask questions and all were answered. The patient agreed with the plan and demonstrated an understanding of the instructions.  The patient was advised to call back or seek an in-person evaluation if the symptoms worsen or if the condition fails to improve as anticipated.   Rickard Patience,  MD 05/25/2020 5:08 PM

## 2020-05-25 NOTE — Progress Notes (Signed)
Pt contacted for Mychart visit. No new concerns voiced.  

## 2020-05-26 ENCOUNTER — Other Ambulatory Visit: Payer: Self-pay

## 2020-05-26 ENCOUNTER — Inpatient Hospital Stay: Payer: BC Managed Care – PPO

## 2020-05-26 VITALS — BP 138/65 | HR 72 | Temp 96.9°F | Resp 20

## 2020-05-26 DIAGNOSIS — N1832 Chronic kidney disease, stage 3b: Secondary | ICD-10-CM | POA: Diagnosis not present

## 2020-05-26 DIAGNOSIS — D509 Iron deficiency anemia, unspecified: Secondary | ICD-10-CM

## 2020-05-26 MED ORDER — IRON SUCROSE 20 MG/ML IV SOLN
200.0000 mg | Freq: Once | INTRAVENOUS | Status: AC
  Administered 2020-05-26: 200 mg via INTRAVENOUS
  Filled 2020-05-26: qty 10

## 2020-05-26 MED ORDER — SODIUM CHLORIDE 0.9 % IV SOLN
Freq: Once | INTRAVENOUS | Status: AC
  Filled 2020-05-26: qty 250

## 2020-05-26 MED ORDER — SODIUM CHLORIDE 0.9 % IV SOLN: 200.0000 mg | Freq: Once | INTRAVENOUS | Status: DC

## 2020-05-26 NOTE — Progress Notes (Signed)
Venofer well tolerated. Discharged home in stable condition. 

## 2020-06-08 ENCOUNTER — Inpatient Hospital Stay: Payer: BC Managed Care – PPO | Attending: Oncology

## 2020-06-08 VITALS — BP 110/64 | HR 86 | Temp 98.4°F | Resp 18

## 2020-06-08 DIAGNOSIS — E1122 Type 2 diabetes mellitus with diabetic chronic kidney disease: Secondary | ICD-10-CM | POA: Diagnosis not present

## 2020-06-08 DIAGNOSIS — I129 Hypertensive chronic kidney disease with stage 1 through stage 4 chronic kidney disease, or unspecified chronic kidney disease: Secondary | ICD-10-CM | POA: Insufficient documentation

## 2020-06-08 DIAGNOSIS — D509 Iron deficiency anemia, unspecified: Secondary | ICD-10-CM

## 2020-06-08 DIAGNOSIS — N1832 Chronic kidney disease, stage 3b: Secondary | ICD-10-CM | POA: Diagnosis not present

## 2020-06-08 DIAGNOSIS — D631 Anemia in chronic kidney disease: Secondary | ICD-10-CM | POA: Insufficient documentation

## 2020-06-08 MED ORDER — SODIUM CHLORIDE 0.9 % IV SOLN: 200.0000 mg | Freq: Once | INTRAVENOUS | Status: DC

## 2020-06-08 MED ORDER — SODIUM CHLORIDE 0.9 % IV SOLN
Freq: Once | INTRAVENOUS | Status: AC
  Filled 2020-06-08: qty 250

## 2020-06-08 MED ORDER — IRON SUCROSE 20 MG/ML IV SOLN
200.0000 mg | Freq: Once | INTRAVENOUS | Status: AC
  Administered 2020-06-08: 200 mg via INTRAVENOUS
  Filled 2020-06-08: qty 10

## 2020-06-08 NOTE — Progress Notes (Signed)
Mrs. Linders tolerated her Venofer infusion today without any complications.

## 2020-06-27 ENCOUNTER — Other Ambulatory Visit: Payer: Self-pay | Admitting: Oncology

## 2020-08-25 ENCOUNTER — Other Ambulatory Visit: Payer: Self-pay

## 2020-08-25 DIAGNOSIS — D509 Iron deficiency anemia, unspecified: Secondary | ICD-10-CM

## 2020-08-26 ENCOUNTER — Inpatient Hospital Stay: Payer: BC Managed Care – PPO | Attending: Oncology

## 2020-08-26 DIAGNOSIS — N1832 Chronic kidney disease, stage 3b: Secondary | ICD-10-CM | POA: Diagnosis not present

## 2020-08-26 DIAGNOSIS — Z801 Family history of malignant neoplasm of trachea, bronchus and lung: Secondary | ICD-10-CM | POA: Diagnosis not present

## 2020-08-26 DIAGNOSIS — R251 Tremor, unspecified: Secondary | ICD-10-CM | POA: Diagnosis not present

## 2020-08-26 DIAGNOSIS — D509 Iron deficiency anemia, unspecified: Secondary | ICD-10-CM

## 2020-08-26 DIAGNOSIS — Z833 Family history of diabetes mellitus: Secondary | ICD-10-CM | POA: Diagnosis not present

## 2020-08-26 DIAGNOSIS — Z79899 Other long term (current) drug therapy: Secondary | ICD-10-CM | POA: Insufficient documentation

## 2020-08-26 DIAGNOSIS — R5383 Other fatigue: Secondary | ICD-10-CM | POA: Diagnosis not present

## 2020-08-26 DIAGNOSIS — Z8349 Family history of other endocrine, nutritional and metabolic diseases: Secondary | ICD-10-CM | POA: Diagnosis not present

## 2020-08-26 DIAGNOSIS — E611 Iron deficiency: Secondary | ICD-10-CM | POA: Insufficient documentation

## 2020-08-26 DIAGNOSIS — D631 Anemia in chronic kidney disease: Secondary | ICD-10-CM | POA: Diagnosis present

## 2020-08-26 DIAGNOSIS — Z803 Family history of malignant neoplasm of breast: Secondary | ICD-10-CM | POA: Insufficient documentation

## 2020-08-26 LAB — CBC WITH DIFFERENTIAL/PLATELET
Abs Immature Granulocytes: 0.02 10*3/uL (ref 0.00–0.07)
Basophils Absolute: 0.1 10*3/uL (ref 0.0–0.1)
Basophils Relative: 1 %
Eosinophils Absolute: 0.3 10*3/uL (ref 0.0–0.5)
Eosinophils Relative: 4 %
HCT: 33.7 % — ABNORMAL LOW (ref 36.0–46.0)
Hemoglobin: 10.9 g/dL — ABNORMAL LOW (ref 12.0–15.0)
Immature Granulocytes: 0 %
Lymphocytes Relative: 25 %
Lymphs Abs: 1.9 10*3/uL (ref 0.7–4.0)
MCH: 31.1 pg (ref 26.0–34.0)
MCHC: 32.3 g/dL (ref 30.0–36.0)
MCV: 96 fL (ref 80.0–100.0)
Monocytes Absolute: 0.6 10*3/uL (ref 0.1–1.0)
Monocytes Relative: 8 %
Neutro Abs: 4.9 10*3/uL (ref 1.7–7.7)
Neutrophils Relative %: 62 %
Platelets: 289 10*3/uL (ref 150–400)
RBC: 3.51 MIL/uL — ABNORMAL LOW (ref 3.87–5.11)
RDW: 13.3 % (ref 11.5–15.5)
WBC: 7.8 10*3/uL (ref 4.0–10.5)
nRBC: 0 % (ref 0.0–0.2)

## 2020-08-26 LAB — FERRITIN: Ferritin: 49 ng/mL (ref 11–307)

## 2020-08-26 LAB — IRON AND TIBC
Iron: 50 ug/dL (ref 28–170)
Saturation Ratios: 20 % (ref 10.4–31.8)
TIBC: 251 ug/dL (ref 250–450)
UIBC: 201 ug/dL

## 2020-08-28 ENCOUNTER — Other Ambulatory Visit: Payer: Self-pay

## 2020-08-28 ENCOUNTER — Inpatient Hospital Stay (HOSPITAL_BASED_OUTPATIENT_CLINIC_OR_DEPARTMENT_OTHER): Payer: BC Managed Care – PPO | Admitting: Oncology

## 2020-08-28 ENCOUNTER — Inpatient Hospital Stay: Payer: BC Managed Care – PPO

## 2020-08-28 ENCOUNTER — Encounter: Payer: Self-pay | Admitting: Oncology

## 2020-08-28 VITALS — BP 121/77 | HR 85 | Temp 97.7°F | Resp 18

## 2020-08-28 DIAGNOSIS — D509 Iron deficiency anemia, unspecified: Secondary | ICD-10-CM | POA: Diagnosis not present

## 2020-08-28 DIAGNOSIS — N1832 Chronic kidney disease, stage 3b: Secondary | ICD-10-CM | POA: Diagnosis not present

## 2020-08-28 DIAGNOSIS — D631 Anemia in chronic kidney disease: Secondary | ICD-10-CM

## 2020-08-28 MED ORDER — FERROUS SULFATE 325 (65 FE) MG PO TABS: 325.0000 mg | ORAL_TABLET | Freq: Two times a day (BID) | ORAL | 1 refills | Status: DC

## 2020-08-28 NOTE — Progress Notes (Signed)
Hematology/Oncology Consult note [facility] Telephone:([phone] [phone]   Patient Care Team: Karen Kitchens as PCP - General (Physician Assistant) Rickard Patience, MD as Consulting Physician (Hematology and Oncology)  REFERRING PROVIDER: Rushie Chestnut, PA-C CHIEF COMPLAINTS/REASON FOR VISIT:  Evaluation of iron deficiency anemia  HISTORY OF PRESENTING ILLNESS:  Vickie Montoya is a  45 y.o.  female with PMH listed below was seen in consultation at the request of Karen Kitchens  for evaluation of iron deficiency anemia.   Reviewed patient's recent labs  10/24/2019, labs revealed anemia with hemoglobin of 10.3, WBC 13.5, platelet count 481, differential showed neutrophilia..  Ferritin 10. Reviewed patient's previous labs ordered by primary care physician's office, anemia is chronic onset , duration is since at least 2009.  Chronic decreased iron saturation and ferritin. No aggravating or improving factors. Patient follows up with neurology Dr. Malvin Johns for facial spasms, body jerking and tremors. Associated signs and symptoms: Patient reports fatigue.  Denies SOB with exertion.  Denies weight loss, easy bruising, hematochezia, hemoptysis, hematuria. Context:  History of iron deficiency: Patient has been on oral iron supplementation Rectal bleeding: Denies Menstrual bleeding/ Vaginal bleeding : Patient reports normal flow, usually last 3 days. hematemesis or hemoptysis : denies Blood in urine : denies   INTERVAL HISTORY Vickie Montoya is a 45 y.o. female who has above history reviewed by me today presents for follow up visit for management of anemia.  Problems and complaints are listed below: Patient has received IV Venofer treatments.  She reports the level has improved.  She was accompanied by her husband.  Chronic jerky movements and spasm.   Review of Systems  Constitutional: Negative for fatigue.  Respiratory: Negative  for cough and shortness of breath.   Cardiovascular: Negative for chest pain.  Gastrointestinal: Negative for abdominal pain.  Genitourinary: Negative for dysuria.   Musculoskeletal: Negative for back pain.  Neurological:       Body jerks patient spasms and tremors.  Psychiatric/Behavioral: Negative for confusion.    MEDICAL HISTORY:  Past Medical History:  Diagnosis Date  . Blind   . Depression   . Diabetes mellitus without complication (HCC)   . Hypertension   . Migraine     SURGICAL HISTORY: Past Surgical History:  Procedure Laterality Date  . CESAREAN SECTION    . ORIF TOE FRACTURE Left 01/18/2019   Procedure: OPEN REDUCTION INTERNAL FIXATION (ORIF) METATARSAL (TOE) FRACTURE;  Surgeon: Gwyneth Revels, DPM;  Location: [facility] ORS;  Service: Podiatry;  Laterality: Left;  . TUBAL LIGATION    . tubiligation      SOCIAL HISTORY: Social History   Socioeconomic History  . Marital status: Married    Spouse name: Not on file  . Number of children: Not on file  . Years of education: Not on file  . Highest education level: Not on file  Occupational History  . Occupation: disabled   Tobacco Use  . Smoking status: Never Smoker  . Smokeless tobacco: Never Used  Vaping Use  . Vaping Use: Never used  Substance and Sexual Activity  . Alcohol use: Yes    Comment: occasional   . Drug use: Yes    Types: Marijuana    Comment: occasional   . Sexual activity: Not on file  Other Topics Concern  . Not on file  Social History Narrative  . Not on file   Social Determinants of Health   Financial Resource Strain: Not on file  Food Insecurity: Not on file  Transportation Needs: Not on file  Physical Activity: Not on file  Stress: Not on file  Social Connections: Not on file  Intimate Partner Violence: Not on file    FAMILY HISTORY: Family History  Problem Relation Age of Onset  . Hyperlipidemia Mother   . Diabetes Father   . Breast cancer Maternal Aunt   . Lung cancer  Maternal Uncle     ALLERGIES:  is allergic to sitagliptin.  MEDICATIONS:  Current Outpatient Medications  Medication Sig Dispense Refill  . amLODipine (NORVASC) 10 MG tablet Take 10 mg by mouth daily.    Marland Kitchen aspirin EC 81 MG tablet Take 81 mg by mouth at bedtime.     . carvedilol (COREG) 12.5 MG tablet Take 25 mg by mouth 2 (two) times daily.    . cetirizine (ZYRTEC) 10 MG tablet Take 10 mg by mouth daily.    . CVS VITAMIN C 250 MG tablet TAKE 1 TABLET BY MOUTH EVERY DAY 90 tablet 1  . cyanocobalamin 1000 MCG tablet Take by mouth.    . Dulaglutide 0.75 MG/0.5ML SOPN Inject into the skin. Inject 0.5 mLs (0.75 mg total) subcutaneously once a week for 30 days    . ferrous sulfate 325 (65 FE) MG tablet TAKE 1 TABLET BY MOUTH 2 TIMES DAILY WITH A MEAL. 180 tablet 0  . hydrALAZINE (APRESOLINE) 50 MG tablet Take 50 mg by mouth every 8 (eight) hours.    . insulin detemir (LEVEMIR) 100 UNIT/ML injection Inject 54 Units into the skin at bedtime.     Marland Kitchen lisinopril (PRINIVIL,ZESTRIL) 20 MG tablet Take 20 mg by mouth daily.     . pravastatin (PRAVACHOL) 10 MG tablet Take 10 mg by mouth at bedtime.     Marland Kitchen QUEtiapine (SEROQUEL) 25 MG tablet Take 25 mg at night for a week then increase to 50 mg at night and continue that dose    . clonazePAM (KLONOPIN) 0.5 MG tablet Take 0.5 mg twice a day for two weeks then increase to 0.5 mg three times a day and continue that dose (Patient not taking: No sig reported)    . escitalopram (LEXAPRO) 10 MG tablet Take by mouth. (Patient not taking: Reported on 08/28/2020)    . HYDROcodone-acetaminophen (NORCO) 5-325 MG tablet Take 1-2 tablets by mouth every 6 (six) hours as needed for moderate pain. (Patient not taking: Reported on 08/28/2020) 30 tablet 0   No current facility-administered medications for this visit.     PHYSICAL EXAMINATION: ECOG PERFORMANCE STATUS: 2 - Symptomatic, <50% confined to bed Vitals:   08/28/20 1316  BP: 121/77  Pulse: 85  Resp: 18  Temp:  97.7 F (36.5 C)   There were no vitals filed for this visit.  Physical Exam Constitutional:      General: She is not in acute distress. HENT:     Head: Normocephalic and atraumatic.  Eyes:     Comments: Not able to open her eyes.  Cardiovascular:     Rate and Rhythm: Normal rate and regular rhythm.     Heart sounds: Normal heart sounds.  Pulmonary:     Effort: Pulmonary effort is normal. No respiratory distress.     Breath sounds: No wheezing.  Abdominal:     General: Bowel sounds are normal. There is no distension.  Musculoskeletal:        General: No deformity. Normal range of motion.     Cervical back: Normal range of motion and neck  supple.  Skin:    General: Skin is warm and dry.     Findings: No erythema or rash.  Neurological:     Mental Status: She is alert. Mental status is at baseline.     Cranial Nerves: No cranial nerve deficit.     Coordination: Coordination normal.     Comments: Constant body jerks, grimace, movement  Psychiatric:        Mood and Affect: Mood normal.       CMP Latest Ref Rng & Units 02/10/2020  Glucose 70 - 99 mg/dL 87  BUN 6 - 20 mg/dL 16(X)  Creatinine 0.96 - 1.00 mg/dL 0.45(W)  Sodium 098 - 119 mmol/L 140  Potassium 3.5 - 5.1 mmol/L 4.3  Chloride 98 - 111 mmol/L 112(H)  CO2 22 - 32 mmol/L 22  Calcium 8.9 - 10.3 mg/dL 1.4(N)  Total Protein 6.5 - 8.1 g/dL 7.4  Total Bilirubin 0.3 - 1.2 mg/dL 0.6  Alkaline Phos 38 - 126 U/L 46  AST 15 - 41 U/L 14(L)  ALT 0 - 44 U/L 13   CBC Latest Ref Rng & Units 08/26/2020  WBC 4.0 - 10.5 K/uL 7.8  Hemoglobin 12.0 - 15.0 g/dL 10.9(L)  Hematocrit 36.0 - 46.0 % 33.7(L)  Platelets 150 - 400 K/uL 289     LABORATORY DATA:  I have reviewed the data as listed Lab Results  Component Value Date   WBC 7.8 08/26/2020   HGB 10.9 (L) 08/26/2020   HCT 33.7 (L) 08/26/2020   MCV 96.0 08/26/2020   PLT 289 08/26/2020   Recent Labs    11/12/19 1604 02/10/20 1331  NA 138 140  K 4.6 4.3  CL 105  112*  CO2 28 22  GLUCOSE 110* 87  BUN 32* 22*  CREATININE 1.86* 1.52*  CALCIUM 8.9 8.4*  GFRNONAA 33* 42*  GFRAA 38*  --   PROT 7.6 7.4  ALBUMIN 3.3* 3.4*  AST 14* 14*  ALT 12 13  ALKPHOS 54 46  BILITOT 0.4 0.6   Iron/TIBC/Ferritin/ %Sat    Component Value Date/Time   IRON 50 08/26/2020 1320   TIBC 251 08/26/2020 1320   FERRITIN 49 08/26/2020 1320   IRONPCTSAT 20 08/26/2020 1320     RADIOGRAPHIC STUDIES: I have personally reviewed the radiological images as listed and agreed with the findings in the report. No results found.     ASSESSMENT & PLAN:  1. Iron deficiency anemia, unspecified iron deficiency anemia type   2. Stage 3b chronic kidney disease (HCC)   3. Anemia due to stage 3b chronic kidney disease (HCC)    # Iron deficiency anemia, In the context of chronic kidney disease Labs are reviewed and discussed with patient. Hemoglobin has slightly improved.  Iron panel also is stable. Recommend patient to continue oral iron supplementation Hold IV Venofer treatment today.   CKD, SPEP showed no M protein.  Avoid nephrotoxin.  Constant body jerks, grimace, etiology is unknown. Follow-up with neurology.  Orders Placed This Encounter  Procedures  . CBC with Differential/Platelet    Standing Status:   Future    Standing Expiration Date:   08/28/2021  . Ferritin    Standing Status:   Future    Standing Expiration Date:   08/28/2021  . Iron and TIBC    Standing Status:   Future    Standing Expiration Date:   08/28/2021    All questions were answered. The patient knows to call the clinic with any problems questions or  concerns.  Cc Rushie ChestnutCovington, Sarah M, PA-C  Follow-up in 6 months.   Rickard PatienceZhou Tamantha Saline, MD, PhD Hematology Oncology [facility] 08/28/2020

## 2020-08-28 NOTE — Progress Notes (Signed)
Patient denies new problems/concerns today.   °

## 2020-10-20 ENCOUNTER — Ambulatory Visit
Admission: EM | Admit: 2020-10-20 | Discharge: 2020-10-20 | Disposition: A | Discharge status: Home / Self Care | Payer: BC Managed Care – PPO | Attending: Emergency Medicine | Admitting: Emergency Medicine

## 2020-10-20 ENCOUNTER — Other Ambulatory Visit: Payer: Self-pay

## 2020-10-20 DIAGNOSIS — M25511 Pain in right shoulder: Secondary | ICD-10-CM

## 2020-10-20 DIAGNOSIS — G249 Dystonia, unspecified: Secondary | ICD-10-CM | POA: Diagnosis not present

## 2020-10-20 DIAGNOSIS — M62838 Other muscle spasm: Secondary | ICD-10-CM

## 2020-10-20 MED ORDER — TIZANIDINE HCL 4 MG PO TABS: 4.0000 mg | ORAL_TABLET | Freq: Three times a day (TID) | ORAL | 0 refills | Status: DC | PRN

## 2020-10-20 MED ORDER — HYDROCODONE-ACETAMINOPHEN 5-325 MG PO TABS: 1.0000 | ORAL_TABLET | Freq: Four times a day (QID) | ORAL | 0 refills | Status: DC | PRN

## 2020-10-20 MED ORDER — ACETAMINOPHEN 500 MG PO TABS
1000.0000 mg | ORAL_TABLET | Freq: Once | ORAL | Status: AC
  Administered 2020-10-20: 1000 mg via ORAL

## 2020-10-20 NOTE — Discharge Instructions (Addendum)
Stop the Klonopin.  Try the Zanaflex instead.  Take a Tylenol containing product 3 or 4 times a day as needed.  Either 1000 mg of Tylenol for mild to moderate pain or 1-2 Norco for severe pain.  Try some Epson salt baths.  Please follow-up with your primary care provider ASAP for neck steps.

## 2020-10-20 NOTE — ED Triage Notes (Signed)
Pt c/o right neck pain that goes down her shoulder into her arm for the last 3 weeks, on and off tingling.

## 2020-10-20 NOTE — ED Provider Notes (Signed)
HPI  SUBJECTIVE:  Vickie Montoya is a 45 y.o. female who presents with 2 to 3 weeks of  intermittent, right neck pain described as stabbing, soreness with radiation down her right arm.  she reports tingling in her fingers.  no change in physical activity, change in the dystonic movements of her head and neck.  she reports shoulder and arm weakness secondary to the pain.  no chest pain, shortness of breath, palpitations, fevers.  states that she is unable to sleep at night secondary to the pain.  she reports having a fall in march where she "cracked my neck" on the left side, but did not have any pain after this fall.  she tried klonopin 1 twice daily prescribed to her by neurology, which she states is not working.  She has tried Tylenol 1000 mg twice daily and hot water.  The Tylenol helps.  Symptoms are worse with lying down at night.  He has a past medical history of diabetes, hypertension, chronic kidney disease stage IIIb, dystonia/spasmodic torticollis for the past several years.  No history of osteoarthritis C-spine, right shoulder injury, MI, coronary disease, hypercholesterolemia, smoking, arrhythmia.  LMP: 5/26.  Denies the possibility being pregnant.  PMD: [facility].  Past Medical History:  Diagnosis Date   Blind    Depression    Diabetes mellitus without complication (HCC)    Hypertension    Migraine     Past Surgical History:  Procedure Laterality Date   CESAREAN SECTION     ORIF TOE FRACTURE Left 01/18/2019   Procedure: OPEN REDUCTION INTERNAL FIXATION (ORIF) METATARSAL (TOE) FRACTURE;  Surgeon: Gwyneth Revels, DPM;  Location: [facility] OR[facility];  Service: Podiatry;  Laterality: Left;   TUBAL LIGATION     tubiligation      Family History  Problem Relation Age of Onset   Hyperlipidemia Mother    Diabetes Father    Breast cancer Maternal Aunt    Lung cancer Maternal Uncle     Social History   Tobacco Use   Smoking status: Never   Smokeless tobacco: Never  Vaping Use    Vaping Use: Never used  Substance Use Topics   Alcohol use: Yes    Comment: occasional    Drug use: Yes    Types: Marijuana    Comment: occasional     No current facility-administered medications for this encounter.  Current Outpatient Medications:    amLODipine (NORVASC) 10 MG tablet, Take 10 mg by mouth daily., Disp: , Rfl:    aspirin EC 81 MG tablet, Take 81 mg by mouth at bedtime. , Disp: , Rfl:    carvedilol (COREG) 12.5 MG tablet, Take 25 mg by mouth 2 (two) times daily., Disp: , Rfl:    cetirizine (ZYRTEC) 10 MG tablet, Take 10 mg by mouth daily., Disp: , Rfl:    [employer]S VITAMIN C 250 MG tablet, TAKE 1 TABLET BY MOUTH EVERY DAY, Disp: 90 tablet, Rfl: 1   cyanocobalamin 1000 MCG tablet, Take by mouth., Disp: , Rfl:    Dulaglutide 0.75 MG/0.5ML SOPN, Inject into the skin. Inject 0.5 mLs (0.75 mg total) subcutaneously once a week for 30 days, Disp: , Rfl:    ferrous sulfate 325 (65 FE) MG tablet, Take 1 tablet (325 mg total) by mouth 2 (two) times daily with a meal., Disp: 180 tablet, Rfl: 1   hydrALAZINE (APRESOLINE) 50 MG tablet, Take 50 mg by mouth every 8 (eight) hours., Disp: , Rfl:    HYDROcodone-acetaminophen (NORCO/VICODIN) 5-325  MG tablet, Take 1-2 tablets by mouth every 6 (six) hours as needed for moderate pain or severe pain., Disp: 12 tablet, Rfl: 0   insulin detemir (LEVEMIR) 100 UNIT/ML injection, Inject 54 Units into the skin at bedtime. , Disp: , Rfl:    lisinopril (PRINIVIL,ZESTRIL) 20 MG tablet, Take 20 mg by mouth daily. , Disp: , Rfl:    pravastatin (PRAVACHOL) 10 MG tablet, Take 10 mg by mouth at bedtime. , Disp: , Rfl:    tiZANidine (ZANAFLEX) 4 MG tablet, Take 1 tablet (4 mg total) by mouth every 8 (eight) hours as needed for muscle spasms., Disp: 30 tablet, Rfl: 0   escitalopram (LEXAPRO) 10 MG tablet, Take by mouth. (Patient not taking: No sig reported), Disp: , Rfl:   Allergies  Allergen Reactions   Sitagliptin Other (See Comments)     Hyperglycemia  Jardience      ROS  As noted in HPI.   Physical Exam  BP 101/65 (BP Location: Left Arm)   Pulse 73   Temp 99 F (37.2 C) (Oral)   Resp 20   SpO2 100%   Constitutional: Well developed, well nourished, appears uncomfortable.  Eyes:  EOMI, conjunctiva normal bilaterally HENT: Normocephalic, atraumatic,mucus membranes moist Respiratory: Normal inspiratory effort Cardiovascular: Normal rate, regular rhythm, no murmurs, rubs, gallops.  RP 2+ and equal bilaterally.  Positive right sided chest tenderness. GI: nondistended skin: No rash, skin intact Musculoskeletal: no deformities.  Positive tenderness, spasm over the right trapezius.  Positive tenderness over scapula, entire shoulder, C-spine.  Drop test painful but negative.  Pain with empty can test.  Patient unable to perform liftoff test due to pain. Neurologic: Alert & oriented x 3, strength at bilateral upper extremities at the elbow, shoulder 5/5 and equal bilaterally.  Sensation to light touch and temperature grossly intact and equal bilaterally.  Positive involuntary movements of the head. Psychiatric: Speech and behavior appropriate   ED Course   Medications  acetaminophen (TYLENOL) tablet 1,000 mg (1,000 mg Oral Given 10/20/20 1552)    Orders Placed This Encounter  Procedures   Recheck vitals    Standing Status:   Standing    Number of Occurrences:   1    No results found for this or any previous visit (from the past 24 hour(s)). No results found.  ED Clinical Impression  1. Acute pain of right shoulder   2. Dystonia   3. Muscle spasm of right shoulder      ED Assessment/Plan  Outside records, labs reviewed.  As noted in HPI.   Narcotic database reviewed for this patient, and feel that the risk/benefit ratio today is favorable for proceeding with a prescription for controlled substance.  Has been getting regular prescriptions of Klonopin from same two prescribers since August 21.  last  fill 10/08/2020.  No opiate prescriptions in the past 2 years  Calculated creatinine clearance 77 mL/min on outside labs from February 2022.   Her repeat vitals are normal.  This appears to be very musculoskeletal, as it does get better with Tylenol.  Wonder if it could be derived from the involuntary dystonic movements that she is having of her neck.  She is already on Klonopin for this.  Do not think that we will be able to obtain an adequate C-spine due to constant involuntary movements of her head neck.  However this has been going on for 2 to 3 weeks, if there is something life-threatening, I would think that it would have manifested by  now.  She reports injury to her neck in March, but no pain after that.  Hesitant to prescribe NSAIDs due to the chronic kidney disease stage IIIb,  hesitant to prescribe steroids because of the diabetes.  We can certainly try 1000 mg of Tylenol 3-4 times a day, and if this does not work, then 1 Norco 2 or 3 times a day.  Discontinue Klonopin because it is not working, will try Zanaflex.  Do not need to renally this with a creatinine clearance of 77.  Advised Epson salt baths, close follow-up with PMD for next steps if this does not work.  Discussed MDM, treatment plan, and plan for follow-up with patient and parent.   They agree with plan.   Meds ordered this encounter  Medications   acetaminophen (TYLENOL) tablet 1,000 mg   tiZANidine (ZANAFLEX) 4 MG tablet    Sig: Take 1 tablet (4 mg total) by mouth every 8 (eight) hours as needed for muscle spasms.    Dispense:  30 tablet    Refill:  0   HYDROcodone-acetaminophen (NORCO/VICODIN) 5-325 MG tablet    Sig: Take 1-2 tablets by mouth every 6 (six) hours as needed for moderate pain or severe pain.    Dispense:  12 tablet    Refill:  0      *This clinic note was created using Scientist, clinical (histocompatibility and immunogenetics). Therefore, there may be occasional mistakes despite careful proofreading.  ?    Domenick Gong,  MD 10/22/20 [phone]

## 2021-01-23 ENCOUNTER — Other Ambulatory Visit: Payer: Self-pay | Admitting: Oncology

## 2021-01-24 ENCOUNTER — Encounter: Payer: Self-pay | Admitting: Oncology

## 2021-03-03 ENCOUNTER — Other Ambulatory Visit: Payer: Self-pay

## 2021-03-03 ENCOUNTER — Inpatient Hospital Stay: Payer: BC Managed Care – PPO | Attending: Oncology

## 2021-03-03 DIAGNOSIS — N1832 Chronic kidney disease, stage 3b: Secondary | ICD-10-CM | POA: Diagnosis not present

## 2021-03-03 DIAGNOSIS — Z803 Family history of malignant neoplasm of breast: Secondary | ICD-10-CM | POA: Diagnosis not present

## 2021-03-03 DIAGNOSIS — Z801 Family history of malignant neoplasm of trachea, bronchus and lung: Secondary | ICD-10-CM | POA: Insufficient documentation

## 2021-03-03 DIAGNOSIS — E611 Iron deficiency: Secondary | ICD-10-CM | POA: Diagnosis not present

## 2021-03-03 DIAGNOSIS — Z79899 Other long term (current) drug therapy: Secondary | ICD-10-CM | POA: Diagnosis not present

## 2021-03-03 DIAGNOSIS — D631 Anemia in chronic kidney disease: Secondary | ICD-10-CM | POA: Diagnosis present

## 2021-03-03 DIAGNOSIS — Z833 Family history of diabetes mellitus: Secondary | ICD-10-CM | POA: Insufficient documentation

## 2021-03-03 DIAGNOSIS — Z8349 Family history of other endocrine, nutritional and metabolic diseases: Secondary | ICD-10-CM | POA: Diagnosis not present

## 2021-03-03 DIAGNOSIS — D509 Iron deficiency anemia, unspecified: Secondary | ICD-10-CM

## 2021-03-03 LAB — CBC WITH DIFFERENTIAL/PLATELET
Abs Immature Granulocytes: 0.02 10*3/uL (ref 0.00–0.07)
Basophils Absolute: 0.1 10*3/uL (ref 0.0–0.1)
Basophils Relative: 1 %
Eosinophils Absolute: 0.2 10*3/uL (ref 0.0–0.5)
Eosinophils Relative: 3 %
HCT: 34.3 % — ABNORMAL LOW (ref 36.0–46.0)
Hemoglobin: 11 g/dL — ABNORMAL LOW (ref 12.0–15.0)
Immature Granulocytes: 0 %
Lymphocytes Relative: 31 %
Lymphs Abs: 2.7 10*3/uL (ref 0.7–4.0)
MCH: 31.3 pg (ref 26.0–34.0)
MCHC: 32.1 g/dL (ref 30.0–36.0)
MCV: 97.7 fL (ref 80.0–100.0)
Monocytes Absolute: 0.7 10*3/uL (ref 0.1–1.0)
Monocytes Relative: 8 %
Neutro Abs: 5 10*3/uL (ref 1.7–7.7)
Neutrophils Relative %: 57 %
Platelets: 285 10*3/uL (ref 150–400)
RBC: 3.51 MIL/uL — ABNORMAL LOW (ref 3.87–5.11)
RDW: 13.2 % (ref 11.5–15.5)
WBC: 8.6 10*3/uL (ref 4.0–10.5)
nRBC: 0 % (ref 0.0–0.2)

## 2021-03-03 LAB — IRON AND TIBC
Iron: 69 ug/dL (ref 28–170)
Saturation Ratios: 24 % (ref 10.4–31.8)
TIBC: 291 ug/dL (ref 250–450)
UIBC: 222 ug/dL

## 2021-03-03 LAB — FERRITIN: Ferritin: 22 ng/mL (ref 11–307)

## 2021-03-05 ENCOUNTER — Other Ambulatory Visit: Payer: Self-pay | Admitting: Oncology

## 2021-03-05 ENCOUNTER — Ambulatory Visit: Payer: BC Managed Care – PPO | Admitting: Oncology

## 2021-03-08 ENCOUNTER — Encounter: Payer: Self-pay | Admitting: Oncology

## 2021-03-08 ENCOUNTER — Other Ambulatory Visit: Payer: Self-pay

## 2021-03-08 ENCOUNTER — Inpatient Hospital Stay (HOSPITAL_BASED_OUTPATIENT_CLINIC_OR_DEPARTMENT_OTHER): Payer: BC Managed Care – PPO | Admitting: Oncology

## 2021-03-08 VITALS — BP 125/73 | HR 73 | Temp 97.0°F | Resp 18

## 2021-03-08 DIAGNOSIS — N1832 Chronic kidney disease, stage 3b: Secondary | ICD-10-CM | POA: Diagnosis not present

## 2021-03-08 DIAGNOSIS — D631 Anemia in chronic kidney disease: Secondary | ICD-10-CM | POA: Diagnosis not present

## 2021-03-08 NOTE — Progress Notes (Signed)
Hematology/Oncology Progress note [phone] [phone]   Patient Care Team: Vallery Sa as PCP - General (Physician Assistant) Earlie Server, MD as Consulting Physician (Hematology and Oncology)  REFERRING PROVIDER: Hughie Closs, PA-C CHIEF COMPLAINTS/REASON FOR VISIT:  iron deficiency anemia  HISTORY OF PRESENTING ILLNESS:  Vickie Montoya is a  45 y.o.  female with PMH listed below was seen in consultation at the request of Vallery Sa  for evaluation of iron deficiency anemia.   Reviewed patient's recent labs  10/24/2019, labs revealed anemia with hemoglobin of 10.3, WBC 13.5, platelet count 481, differential showed neutrophilia..  Ferritin 10. Reviewed patient's previous labs ordered by primary care physician's office, anemia is chronic onset , duration is since at least 2009.  Chronic decreased iron saturation and ferritin. No aggravating or improving factors. Patient follows up with neurology Dr. Melrose Nakayama for facial spasms, body jerking and tremors. Associated signs and symptoms: Patient reports fatigue.  Denies SOB with exertion.  Denies weight loss, easy bruising, hematochezia, hemoptysis, hematuria. Context:  History of iron deficiency: Patient has been on oral iron supplementation Rectal bleeding: Denies Menstrual bleeding/ Vaginal bleeding : Patient reports normal flow, usually last 3 days. hematemesis or hemoptysis : denies Blood in urine : denies   INTERVAL HISTORY Vickie Montoya is a 45 y.o. female who has above history reviewed by me today presents for follow up visit for management of anemia.  Patient reports feeling well.  No new complaints.  She was accompanied by her husband.   Review of Systems  Constitutional:  Negative for fatigue.  Respiratory:  Negative for cough and shortness of breath.   Cardiovascular:  Negative for chest pain.  Gastrointestinal:  Negative for abdominal pain.  Genitourinary:  Negative  for dysuria.   Musculoskeletal:  Negative for back pain.  Neurological:        Body jerks patient spasms and tremors.  Psychiatric/Behavioral:  Negative for confusion.    MEDICAL HISTORY:  Past Medical History:  Diagnosis Date   Blind    Depression    Diabetes mellitus without complication ([place])    Hypertension    Migraine     SURGICAL HISTORY: Past Surgical History:  Procedure Laterality Date   CESAREAN SECTION     ORIF TOE FRACTURE Left 01/18/2019   Procedure: OPEN REDUCTION INTERNAL FIXATION (ORIF) METATARSAL (TOE) FRACTURE;  Surgeon: Samara Deist, DPM;  Location: [facility] ORS;  Service: Podiatry;  Laterality: Left;   TUBAL LIGATION     tubiligation      SOCIAL HISTORY: Social History   Socioeconomic History   Marital status: Married    Spouse name: Not on file   Number of children: Not on file   Years of education: Not on file   Highest education level: Not on file  Occupational History   Occupation: disabled   Tobacco Use   Smoking status: Never   Smokeless tobacco: Never  Vaping Use   Vaping Use: Never used  Substance and Sexual Activity   Alcohol use: Yes    Comment: occasional    Drug use: Yes    Types: Marijuana    Comment: occasional    Sexual activity: Not on file  Other Topics Concern   Not on file  Social History Narrative   Not on file   Social Determinants of Health   Financial Resource Strain: Not on file  Food Insecurity: Not on file  Transportation Needs: Not on file  Physical Activity: Not on file  Stress: Not on file  Social Connections: Not on file  Intimate Partner Violence: Not on file    FAMILY HISTORY: Family History  Problem Relation Age of Onset   Hyperlipidemia Mother    Diabetes Father    Breast cancer Maternal Aunt    Lung cancer Maternal Uncle     ALLERGIES:  is allergic to sitagliptin.  MEDICATIONS:  Current Outpatient Medications  Medication Sig Dispense Refill   amLODipine (NORVASC) 10 MG tablet Take 10 mg  by mouth daily.     aspirin EC 81 MG tablet Take 81 mg by mouth at bedtime.      carvedilol (COREG) 12.5 MG tablet Take 25 mg by mouth 2 (two) times daily.     cetirizine (ZYRTEC) 10 MG tablet Take 10 mg by mouth daily.     CVS VITAMIN C 250 MG tablet TAKE 1 TABLET BY MOUTH EVERY DAY 90 tablet 1   cyanocobalamin 1000 MCG tablet Take by mouth.     Dulaglutide 0.75 MG/0.5ML SOPN Inject into the skin. Inject 0.5 mLs (0.75 mg total) subcutaneously once a week for 30 days     ferrous sulfate 325 (65 FE) MG tablet TAKE 1 TABLET BY MOUTH 2 TIMES DAILY WITH A MEAL. 180 tablet 1   hydrALAZINE (APRESOLINE) 50 MG tablet Take 50 mg by mouth every 8 (eight) hours.     HYDROcodone-acetaminophen (NORCO/VICODIN) 5-325 MG tablet Take 1-2 tablets by mouth every 6 (six) hours as needed for moderate pain or severe pain. 12 tablet 0   insulin detemir (LEVEMIR) 100 UNIT/ML injection Inject 54 Units into the skin at bedtime.      lisinopril (PRINIVIL,ZESTRIL) 20 MG tablet Take 20 mg by mouth daily.      pravastatin (PRAVACHOL) 10 MG tablet Take 10 mg by mouth at bedtime.      tiZANidine (ZANAFLEX) 4 MG tablet Take 1 tablet (4 mg total) by mouth every 8 (eight) hours as needed for muscle spasms. 30 tablet 0   escitalopram (LEXAPRO) 10 MG tablet Take by mouth. (Patient not taking: No sig reported)     No current facility-administered medications for this visit.     PHYSICAL EXAMINATION: ECOG PERFORMANCE STATUS: 2 - Symptomatic, <50% confined to bed Vitals:   03/08/21 1429  BP: 125/73  Pulse: 73  Resp: 18  Temp: (!) 97 F (36.1 C)  SpO2: 98%   There were no vitals filed for this visit.  Physical Exam Constitutional:      General: She is not in acute distress. HENT:     Head: Normocephalic and atraumatic.  Eyes:     Comments: Not able to open her eyes.  Cardiovascular:     Rate and Rhythm: Normal rate and regular rhythm.     Heart sounds: Normal heart sounds.  Pulmonary:     Effort: Pulmonary effort  is normal. No respiratory distress.     Breath sounds: No wheezing.  Abdominal:     General: Bowel sounds are normal. There is no distension.  Musculoskeletal:        General: No deformity. Normal range of motion.     Cervical back: Normal range of motion and neck supple.  Skin:    General: Skin is warm and dry.     Findings: No erythema or rash.  Neurological:     Mental Status: She is alert. Mental status is at baseline.     Cranial Nerves: No cranial nerve deficit.     Coordination: Coordination normal.  Comments: Constant body jerks, grimace, movement  Psychiatric:        Mood and Affect: Mood normal.      CMP Latest Ref Rng & Units 02/10/2020  Glucose 70 - 99 mg/dL 87  BUN 6 - 20 mg/dL 22(H)  Creatinine 0.44 - 1.00 mg/dL 1.52(H)  Sodium 135 - 145 mmol/L 140  Potassium 3.5 - 5.1 mmol/L 4.3  Chloride 98 - 111 mmol/L 112(H)  CO2 22 - 32 mmol/L 22  Calcium 8.9 - 10.3 mg/dL 8.4(L)  Total Protein 6.5 - 8.1 g/dL 7.4  Total Bilirubin 0.3 - 1.2 mg/dL 0.6  Alkaline Phos 38 - 126 U/L 46  AST 15 - 41 U/L 14(L)  ALT 0 - 44 U/L 13   CBC Latest Ref Rng & Units 03/03/2021  WBC 4.0 - 10.5 K/uL 8.6  Hemoglobin 12.0 - 15.0 g/dL 11.0(L)  Hematocrit 36.0 - 46.0 % 34.3(L)  Platelets 150 - 400 K/uL 285     LABORATORY DATA:  I have reviewed the data as listed Lab Results  Component Value Date   WBC 8.6 03/03/2021   HGB 11.0 (L) 03/03/2021   HCT 34.3 (L) 03/03/2021   MCV 97.7 03/03/2021   PLT 285 03/03/2021   No results for input(s): NA, K, CL, CO2, GLUCOSE, BUN, CREATININE, CALCIUM, GFRNONAA, GFRAA, PROT, ALBUMIN, AST, ALT, ALKPHOS, BILITOT, BILIDIR, IBILI in the last 8760 hours.  Iron/TIBC/Ferritin/ %Sat    Component Value Date/Time   IRON 69 03/03/2021 1433   TIBC 291 03/03/2021 1433   FERRITIN 22 03/03/2021 1433   IRONPCTSAT 24 03/03/2021 1433     RADIOGRAPHIC STUDIES: I have personally reviewed the radiological images as listed and agreed with the findings in the  report. No results found.     ASSESSMENT & PLAN:  1. Anemia in stage 3b chronic kidney disease ([place])    # Iron deficiency anemia, In the context of chronic kidney disease Labs reviewed and discussed with patient. Iron panel is stable. Hemoglobin has been stable I will hold off IV treatments at this point. Advised patient to continue oral iron supplementation.  CKD, SPEP showed no M protein.  Avoid nephrotoxin.  Constant body jerks, grimace, etiology is unknown. Follow-up with neurology.  Orders Placed This Encounter  Procedures   CBC with Differential/Platelet    Standing Status:   Future    Standing Expiration Date:   03/08/2022   Comprehensive metabolic panel    Standing Status:   Future    Standing Expiration Date:   03/08/2022   Ferritin    Standing Status:   Future    Standing Expiration Date:   03/08/2022   Iron and TIBC    Standing Status:   Future    Standing Expiration Date:   03/08/2022    All questions were answered. The patient knows to call the clinic with any problems questions or concerns.  Cc Hughie Closs, PA-C  Follow-up in 6 months.   Earlie Server, MD, PhD Hematology Oncology  03/08/2021

## 2021-03-09 ENCOUNTER — Encounter: Payer: Self-pay | Admitting: Oncology

## 2021-05-14 ENCOUNTER — Encounter: Payer: Self-pay | Admitting: Oncology

## 2021-05-17 ENCOUNTER — Other Ambulatory Visit: Payer: Self-pay | Admitting: Oncology

## 2021-05-18 ENCOUNTER — Encounter: Payer: Self-pay | Admitting: Oncology

## 2021-07-14 ENCOUNTER — Ambulatory Visit
Admission: EM | Admit: 2021-07-14 | Discharge: 2021-07-14 | Disposition: A | Discharge status: Home / Self Care | Payer: BC Managed Care – PPO | Attending: Emergency Medicine | Admitting: Emergency Medicine

## 2021-07-14 ENCOUNTER — Other Ambulatory Visit: Payer: Self-pay

## 2021-07-14 ENCOUNTER — Ambulatory Visit (INDEPENDENT_AMBULATORY_CARE_PROVIDER_SITE_OTHER): Payer: BC Managed Care – PPO

## 2021-07-14 DIAGNOSIS — S8011XA Contusion of right lower leg, initial encounter: Secondary | ICD-10-CM | POA: Diagnosis not present

## 2021-07-14 DIAGNOSIS — M79604 Pain in right leg: Secondary | ICD-10-CM

## 2021-07-14 NOTE — Discharge Instructions (Addendum)
Your X-ray was negative for fracture.  Ice, 1000 mg of Tylenol 3 to 4 days as needed for pain, you can try Voltaren topical gel twice a day or Arnica topical gel as for additional pain and swelling relief.  ?

## 2021-07-14 NOTE — ED Triage Notes (Signed)
Pt present fall with injury to her right leg. Pt state she her right foot went through the vent of the air condition unit and caused bruise to her right leg.  ?

## 2021-07-14 NOTE — ED Provider Notes (Signed)
HPI ? ?SUBJECTIVE: ? ?Vickie Montoya is a 46 y.o. female who presents with pain, swelling, bruising along the distal right tibia after accidentally stepping into air conditioning vent this morning.  She reports constant pain.  She is able to bear weight on it.  No distal numbness or tingling.  No injury to the ankle or knee.  Tried Tylenol 1000 mg with some improvement in her symptoms.  Symptoms are worse with "sitting around".  She has a past medical history of diabetes, chronic kidney disease stage III-IV, hypertension, and is blind.  No history of osteoporosis.  PCP: [facility]. ? ? ?Past Medical History:  ?Diagnosis Date  ? Blind   ? Depression   ? Diabetes mellitus without complication ([place])   ? Hypertension   ? Migraine   ? ? ?Past Surgical History:  ?Procedure Laterality Date  ? CESAREAN SECTION    ? ORIF TOE FRACTURE Left 01/18/2019  ? Procedure: OPEN REDUCTION INTERNAL FIXATION (ORIF) METATARSAL (TOE) FRACTURE;  Surgeon: Samara Deist, DPM;  Location: [facility] OR[facility];  Service: Podiatry;  Laterality: Left;  ? TUBAL LIGATION    ? tubiligation    ? ? ?Family History  ?Problem Relation Age of Onset  ? Hyperlipidemia Mother   ? Diabetes Father   ? Breast cancer Maternal Aunt   ? Lung cancer Maternal Uncle   ? ? ?Social History  ? ?Tobacco Use  ? Smoking status: Never  ? Smokeless tobacco: Never  ?Vaping Use  ? Vaping Use: Never used  ?Substance Use Topics  ? Alcohol use: Yes  ?  Comment: occasional   ? Drug use: Yes  ?  Types: Marijuana  ?  Comment: occasional   ? ? ?No current facility-administered medications for this encounter. ? ?Current Outpatient Medications:  ?  amLODipine (NORVASC) 10 MG tablet, Take 10 mg by mouth daily., Disp: , Rfl:  ?  aspirin EC 81 MG tablet, Take 81 mg by mouth at bedtime. , Disp: , Rfl:  ?  carvedilol (COREG) 12.5 MG tablet, Take 25 mg by mouth 2 (two) times daily., Disp: , Rfl:  ?  cetirizine (ZYRTEC) 10 MG tablet, Take 10 mg by mouth daily., Disp: , Rfl:  ?  CVS VITAMIN C 250  MG tablet, TAKE 1 TABLET BY MOUTH EVERY DAY, Disp: 100 tablet, Rfl: 1 ?  cyanocobalamin 1000 MCG tablet, Take by mouth., Disp: , Rfl:  ?  Dulaglutide 0.75 MG/0.5ML SOPN, Inject into the skin. Inject 0.5 mLs (0.75 mg total) subcutaneously once a week for 30 days, Disp: , Rfl:  ?  escitalopram (LEXAPRO) 10 MG tablet, Take by mouth. (Patient not taking: No sig reported), Disp: , Rfl:  ?  ferrous sulfate 325 (65 FE) MG tablet, TAKE 1 TABLET BY MOUTH 2 TIMES DAILY WITH A MEAL., Disp: 180 tablet, Rfl: 1 ?  hydrALAZINE (APRESOLINE) 50 MG tablet, Take 50 mg by mouth every 8 (eight) hours., Disp: , Rfl:  ?  insulin detemir (LEVEMIR) 100 UNIT/ML injection, Inject 54 Units into the skin at bedtime. , Disp: , Rfl:  ?  lisinopril (PRINIVIL,ZESTRIL) 20 MG tablet, Take 20 mg by mouth daily. , Disp: , Rfl:  ?  pravastatin (PRAVACHOL) 10 MG tablet, Take 10 mg by mouth at bedtime. , Disp: , Rfl:  ? ?Allergies  ?Allergen Reactions  ? Sitagliptin Other (See Comments)  ?  Hyperglycemia ? ?Jardience   ? ? ? ?ROS ? ?As noted in HPI.  ? ?Physical Exam ? ?BP 111/74 (BP  Location: Left Arm)   Pulse 73   Temp 98.5 ?F (36.9 ?C) (Oral)   Resp 18   SpO2 99%  ? ?Constitutional: Well developed, well nourished, no acute distress ?Eyes:  EOMI, conjunctiva normal bilaterally ?HENT: Normocephalic, atraumatic,mucus membranes moist ?Respiratory: Normal inspiratory effort ?Cardiovascular: Normal rate ?GI: nondistended ?skin: No rash, skin intact ?Musculoskeletal: Positive tender swelling, ecchymosis distal right lower extremity on the tibia.  Several superficial abrasions surrounding this.  No fibular, knee, ankle tenderness.  Sensation light touch and temperature distally grossly intact.  DP 2+. ?Neurologic: Alert & oriented x 3, no focal neuro deficits ?Psychiatric: Speech and behavior appropriate ? ? ?ED Course ? ? ?Medications - No data to display ? ?Orders Placed This Encounter  ?Procedures  ? DG Tibia/Fibula Right  ?  Standing Status:   Standing   ?  Number of Occurrences:   1  ?  Order Specific Question:   Reason for Exam (SYMPTOM  OR DIAGNOSIS REQUIRED)  ?  Answer:   Trauma distal right lower extremity, rule out fracture  ? ? ?No results found for this or any previous visit (from the past 24 hour(s)). ?DG Tibia/Fibula Right ? ?Result Date: 07/14/2021 ?CLINICAL DATA:  Distal right lower extremity trauma. Evaluate for fracture. Foot went through vent of air conditioning unit and cause bruising to mid right shin. EXAM: RIGHT TIBIA AND FIBULA - 2 VIEW COMPARISON:  None. FINDINGS: Normal bone mineralization. Minimal degenerative osteophytes at the distal aspect of the medial malleolus. Well marginated chronic 9 mm mineralization within the soft tissues anterior to the junction of the proximal tibia and fibula possibly the sequela of remote trauma. IMPRESSION: No acute fracture. Electronically Signed   By: Yvonne Kendall M.D.   On: 07/14/2021 08:58   ? ?ED Clinical Impression ? ?1. Contusion of right tibia   ?  ? ?ED Assessment/Plan ? ?will check tib-fib because of exquisite tibial tenderness.   ? ?Reviewed imaging independently.  No acute fracture.  see radiology report for full details. ? ?Calculated creatinine clearance from labs 12/22 87 mL/min ? ?Patient with a right tibial contusion.  Home with Tylenol, ice, topical Voltaren gel bid, arnica.  Follow-up with PCP as needed. ? ?Discussed imaging, MDM, treatment plan, and plan for follow-up with patient and husband.  They agree with plan.  ? ?No orders of the defined types were placed in this encounter. ? ? ? ? ?*This clinic note was created using [employer]. Therefore, there may be occasional mistakes despite careful proofreading. ? ?? ? ?  ?Melynda Ripple, MD ?07/14/21 [phone] ? ?

## 2021-07-29 ENCOUNTER — Other Ambulatory Visit: Payer: Self-pay | Admitting: Family Medicine

## 2021-07-29 DIAGNOSIS — Z1231 Encounter for screening mammogram for malignant neoplasm of breast: Secondary | ICD-10-CM

## 2021-09-06 ENCOUNTER — Inpatient Hospital Stay: Payer: BC Managed Care – PPO | Attending: Oncology

## 2021-09-06 DIAGNOSIS — N1832 Chronic kidney disease, stage 3b: Secondary | ICD-10-CM | POA: Diagnosis not present

## 2021-09-06 DIAGNOSIS — Z801 Family history of malignant neoplasm of trachea, bronchus and lung: Secondary | ICD-10-CM | POA: Insufficient documentation

## 2021-09-06 DIAGNOSIS — Z833 Family history of diabetes mellitus: Secondary | ICD-10-CM | POA: Insufficient documentation

## 2021-09-06 DIAGNOSIS — Z8349 Family history of other endocrine, nutritional and metabolic diseases: Secondary | ICD-10-CM | POA: Diagnosis not present

## 2021-09-06 DIAGNOSIS — Z79899 Other long term (current) drug therapy: Secondary | ICD-10-CM | POA: Diagnosis not present

## 2021-09-06 DIAGNOSIS — R251 Tremor, unspecified: Secondary | ICD-10-CM | POA: Diagnosis not present

## 2021-09-06 DIAGNOSIS — D631 Anemia in chronic kidney disease: Secondary | ICD-10-CM | POA: Insufficient documentation

## 2021-09-06 DIAGNOSIS — E611 Iron deficiency: Secondary | ICD-10-CM | POA: Insufficient documentation

## 2021-09-06 DIAGNOSIS — Z803 Family history of malignant neoplasm of breast: Secondary | ICD-10-CM | POA: Diagnosis not present

## 2021-09-06 LAB — COMPREHENSIVE METABOLIC PANEL
ALT: 12 U/L (ref 0–44)
AST: 14 U/L — ABNORMAL LOW (ref 15–41)
Albumin: 3.4 g/dL — ABNORMAL LOW (ref 3.5–5.0)
Alkaline Phosphatase: 47 U/L (ref 38–126)
Anion gap: 6 (ref 5–15)
BUN: 24 mg/dL — ABNORMAL HIGH (ref 6–20)
CO2: 24 mmol/L (ref 22–32)
Calcium: 8.6 mg/dL — ABNORMAL LOW (ref 8.9–10.3)
Chloride: 106 mmol/L (ref 98–111)
Creatinine, Ser: 1.58 mg/dL — ABNORMAL HIGH (ref 0.44–1.00)
GFR, Estimated: 41 mL/min — ABNORMAL LOW (ref >60)
Glucose, Bld: 164 mg/dL — ABNORMAL HIGH (ref 70–99)
Potassium: 4.8 mmol/L (ref 3.5–5.1)
Sodium: 136 mmol/L (ref 135–145)
Total Bilirubin: 0.5 mg/dL (ref 0.3–1.2)
Total Protein: 7.6 g/dL (ref 6.5–8.1)

## 2021-09-06 LAB — CBC WITH DIFFERENTIAL/PLATELET
Abs Immature Granulocytes: 0.03 10*3/uL (ref 0.00–0.07)
Basophils Absolute: 0.1 10*3/uL (ref 0.0–0.1)
Basophils Relative: 1 %
Eosinophils Absolute: 0.2 10*3/uL (ref 0.0–0.5)
Eosinophils Relative: 2 %
HCT: 35.3 % — ABNORMAL LOW (ref 36.0–46.0)
Hemoglobin: 11.5 g/dL — ABNORMAL LOW (ref 12.0–15.0)
Immature Granulocytes: 0 %
Lymphocytes Relative: 19 %
Lymphs Abs: 1.9 10*3/uL (ref 0.7–4.0)
MCH: 31.3 pg (ref 26.0–34.0)
MCHC: 32.6 g/dL (ref 30.0–36.0)
MCV: 96.2 fL (ref 80.0–100.0)
Monocytes Absolute: 0.8 10*3/uL (ref 0.1–1.0)
Monocytes Relative: 8 %
Neutro Abs: 7 10*3/uL (ref 1.7–7.7)
Neutrophils Relative %: 70 %
Platelets: 321 10*3/uL (ref 150–400)
RBC: 3.67 MIL/uL — ABNORMAL LOW (ref 3.87–5.11)
RDW: 12.3 % (ref 11.5–15.5)
WBC: 10 10*3/uL (ref 4.0–10.5)
nRBC: 0 % (ref 0.0–0.2)

## 2021-09-06 LAB — IRON AND TIBC
Iron: 44 ug/dL (ref 28–170)
Saturation Ratios: 18 % (ref 10.4–31.8)
TIBC: 246 ug/dL — ABNORMAL LOW (ref 250–450)
UIBC: 202 ug/dL

## 2021-09-06 LAB — FERRITIN: Ferritin: 25 ng/mL (ref 11–307)

## 2021-09-08 ENCOUNTER — Inpatient Hospital Stay (HOSPITAL_BASED_OUTPATIENT_CLINIC_OR_DEPARTMENT_OTHER): Payer: BC Managed Care – PPO | Admitting: Oncology

## 2021-09-08 ENCOUNTER — Encounter: Payer: Self-pay | Admitting: Oncology

## 2021-09-08 VITALS — BP 120/76 | HR 71 | Temp 97.4°F | Wt 225.0 lb

## 2021-09-08 DIAGNOSIS — N1832 Chronic kidney disease, stage 3b: Secondary | ICD-10-CM | POA: Diagnosis not present

## 2021-09-08 DIAGNOSIS — D631 Anemia in chronic kidney disease: Secondary | ICD-10-CM | POA: Diagnosis not present

## 2021-09-09 ENCOUNTER — Encounter: Payer: Self-pay | Admitting: Oncology

## 2021-09-09 NOTE — Progress Notes (Signed)
?Hematology/Oncology Progress note ?[phone] [phone] ?  ? ? ? ?Patient Care Team: ?Vallery Sa as PCP - General (Physician Assistant) ?Earlie Server, MD as Consulting Physician (Hematology and Oncology) ? ?REFERRING PROVIDER: ?Hughie Closs, PA-C ?CHIEF COMPLAINTS/REASON FOR VISIT:  ?iron deficiency anemia ? ?HISTORY OF PRESENTING ILLNESS:  ?Vickie Montoya is a  46 y.o.  female with PMH listed below was seen in consultation at the request of Hughie Closs, [place]  for evaluation of iron deficiency anemia.  ? ?Reviewed patient's recent labs  ?10/24/2019, labs revealed anemia with hemoglobin of 10.3, WBC 13.5, platelet count 481, differential showed neutrophilia..  Ferritin 10. ?Reviewed patient's previous labs ordered by primary care physician's office, anemia is chronic onset , duration is since at least 2009.  Chronic decreased iron saturation and ferritin. ?No aggravating or improving factors. ?Patient follows up with neurology Dr. Melrose Nakayama for facial spasms, body jerking and tremors. ?Associated signs and symptoms: Patient reports fatigue.  Denies SOB with exertion.  ?Denies weight loss, easy bruising, hematochezia, hemoptysis, hematuria. ?Context:  ?History of iron deficiency: Patient has been on oral iron supplementation ?Rectal bleeding: Denies ?Menstrual bleeding/ Vaginal bleeding : Patient reports normal flow, usually last 3 days. ?hematemesis or hemoptysis : denies ?Blood in urine : denies  ? ?INTERVAL HISTORY ?Vickie Montoya is a 46 y.o. female who has above history reviewed by me today presents for follow up visit for management of anemia.  ?Patient reports feeling well.  She is currently taking oral iron supplementation.  No new complaints.  She was accompanied by her husband. ? ? ?Review of Systems  ?Constitutional:  Negative for fatigue.  ?Respiratory:  Negative for cough and shortness of breath.   ?Cardiovascular:  Negative for chest pain.  ?Gastrointestinal:   Negative for abdominal pain.  ?Genitourinary:  Negative for dysuria.   ?Musculoskeletal:  Negative for back pain.  ?Neurological:   ?     Body jerks patient spasms and tremors.  ?Psychiatric/Behavioral:  Negative for confusion.   ? ?MEDICAL HISTORY:  ?Past Medical History:  ?Diagnosis Date  ? Blind   ? Depression   ? Diabetes mellitus without complication ([place])   ? Hypertension   ? Migraine   ? ? ?SURGICAL HISTORY: ?Past Surgical History:  ?Procedure Laterality Date  ? CESAREAN SECTION    ? ORIF TOE FRACTURE Left 01/18/2019  ? Procedure: OPEN REDUCTION INTERNAL FIXATION (ORIF) METATARSAL (TOE) FRACTURE;  Surgeon: Samara Deist, DPM;  Location: [facility] ORS;  Service: Podiatry;  Laterality: Left;  ? TUBAL LIGATION    ? tubiligation    ? ? ?SOCIAL HISTORY: ?Social History  ? ?Socioeconomic History  ? Marital status: Married  ?  Spouse name: Not on file  ? Number of children: Not on file  ? Years of education: Not on file  ? Highest education level: Not on file  ?Occupational History  ? Occupation: disabled   ?Tobacco Use  ? Smoking status: Never  ? Smokeless tobacco: Never  ?Vaping Use  ? Vaping Use: Never used  ?Substance and Sexual Activity  ? Alcohol use: Yes  ?  Comment: occasional   ? Drug use: Yes  ?  Types: Marijuana  ?  Comment: occasional   ? Sexual activity: Not on file  ?Other Topics Concern  ? Not on file  ?Social History Narrative  ? Not on file  ? ?Social Determinants of Health  ? ?Financial Resource Strain: Not on file  ?Food Insecurity: Not on file  ?  Transportation Needs: Not on file  ?Physical Activity: Not on file  ?Stress: Not on file  ?Social Connections: Not on file  ?Intimate Partner Violence: Not on file  ? ? ?FAMILY HISTORY: ?Family History  ?Problem Relation Age of Onset  ? Hyperlipidemia Mother   ? Diabetes Father   ? Breast cancer Maternal Aunt   ? Lung cancer Maternal Uncle   ? ? ?ALLERGIES:  is allergic to sitagliptin. ? ?MEDICATIONS:  ?Current Outpatient Medications  ?Medication Sig Dispense  Refill  ? amLODipine (NORVASC) 10 MG tablet Take 10 mg by mouth daily.    ? aspirin EC 81 MG tablet Take 81 mg by mouth at bedtime.     ? carvedilol (COREG) 12.5 MG tablet Take 25 mg by mouth 2 (two) times daily.    ? cetirizine (ZYRTEC) 10 MG tablet Take 10 mg by mouth daily.    ? [employer] VITAMIN C 250 MG tablet TAKE 1 TABLET BY MOUTH EVERY DAY 100 tablet 1  ? cyanocobalamin 1000 MCG tablet Take by mouth.    ? Dulaglutide 0.75 MG/0.5ML SOPN Inject into the skin. Inject 0.5 mLs (0.75 mg total) subcutaneously once a week for 30 days    ? ferrous sulfate 325 (65 FE) MG tablet TAKE 1 TABLET BY MOUTH 2 TIMES DAILY WITH A MEAL. 180 tablet 1  ? hydrALAZINE (APRESOLINE) 50 MG tablet Take 50 mg by mouth every 8 (eight) hours.    ? insulin detemir (LEVEMIR) 100 UNIT/ML injection Inject 54 Units into the skin at bedtime.     ? lisinopril (PRINIVIL,ZESTRIL) 20 MG tablet Take 20 mg by mouth daily.     ? pravastatin (PRAVACHOL) 10 MG tablet Take 10 mg by mouth at bedtime.     ? escitalopram (LEXAPRO) 10 MG tablet Take by mouth. (Patient not taking: Reported on 08/28/2020)    ? ?No current facility-administered medications for this visit.  ? ? ? ?PHYSICAL EXAMINATION: ?ECOG PERFORMANCE STATUS: 2 - Symptomatic, <50% confined to bed ?Vitals:  ? 09/08/21 1438  ?BP: 120/76  ?Pulse: 71  ?Temp: (!) 97.4 ?F (36.3 ?C)  ? ?Filed Weights  ? 09/08/21 1438  ?Weight: 225 lb (102.1 kg)  ? ? ?Physical Exam ?Constitutional:   ?   General: She is not in acute distress. ?HENT:  ?   Head: Normocephalic and atraumatic.  ?Eyes:  ?   Comments: Not able to open her eyes.  ?Cardiovascular:  ?   Rate and Rhythm: Normal rate and regular rhythm.  ?   Heart sounds: Normal heart sounds.  ?Pulmonary:  ?   Effort: Pulmonary effort is normal. No respiratory distress.  ?   Breath sounds: No wheezing.  ?Abdominal:  ?   General: Bowel sounds are normal. There is no distension.  ?Musculoskeletal:     ?   General: No deformity. Normal range of motion.  ?   Cervical back:  Normal range of motion and neck supple.  ?Skin: ?   General: Skin is warm and dry.  ?   Findings: No erythema or rash.  ?Neurological:  ?   Mental Status: She is alert. Mental status is at baseline.  ?   Cranial Nerves: No cranial nerve deficit.  ?   Coordination: Coordination normal.  ?   Comments: Constant body jerks, grimace, movement  ?Psychiatric:     ?   Mood and Affect: Mood normal.  ? ? ? ? ? ?  Latest Ref Rng & Units 09/06/2021  ?  2:47 PM  ?  CMP  ?Glucose 70 - 99 mg/dL 164    ?BUN 6 - 20 mg/dL 24    ?Creatinine 0.44 - 1.00 mg/dL 1.58    ?Sodium 135 - 145 mmol/L 136    ?Potassium 3.5 - 5.1 mmol/L 4.8    ?Chloride 98 - 111 mmol/L 106    ?CO2 22 - 32 mmol/L 24    ?Calcium 8.9 - 10.3 mg/dL 8.6    ?Total Protein 6.5 - 8.1 g/dL 7.6    ?Total Bilirubin 0.3 - 1.2 mg/dL 0.5    ?Alkaline Phos 38 - 126 U/L 47    ?AST 15 - 41 U/L 14    ?ALT 0 - 44 U/L 12    ? ? ?  Latest Ref Rng & Units 09/06/2021  ?  2:47 PM  ?CBC  ?WBC 4.0 - 10.5 K/uL 10.0    ?Hemoglobin 12.0 - 15.0 g/dL 11.5    ?Hematocrit 36.0 - 46.0 % 35.3    ?Platelets 150 - 400 K/uL 321    ? ? ? ?LABORATORY DATA:  ?I have reviewed the data as listed ?Lab Results  ?Component Value Date  ? WBC 10.0 09/06/2021  ? HGB 11.5 (L) 09/06/2021  ? HCT 35.3 (L) 09/06/2021  ? MCV 96.2 09/06/2021  ? PLT 321 09/06/2021  ? ?Recent Labs  ?  09/06/21 ?1447  ?NA 136  ?K 4.8  ?CL 106  ?CO2 24  ?GLUCOSE 164*  ?BUN 24*  ?CREATININE 1.58*  ?CALCIUM 8.6*  ?GFRNONAA 41*  ?PROT 7.6  ?ALBUMIN 3.4*  ?AST 14*  ?ALT 12  ?ALKPHOS 47  ?BILITOT 0.5  ? ? ?Iron/TIBC/Ferritin/ %Sat ?   ?Component Value Date/Time  ? IRON 44 09/06/2021 1447  ? TIBC 246 (L) 09/06/2021 1447  ? FERRITIN 25 09/06/2021 1447  ? IRONPCTSAT 18 09/06/2021 1447  ?  ? ?RADIOGRAPHIC STUDIES: ?I have personally reviewed the radiological images as listed and agreed with the findings in the report. ?DG Tibia/Fibula Right ? ?Result Date: 07/14/2021 ?CLINICAL DATA:  Distal right lower extremity trauma. Evaluate for fracture. Foot went  through vent of air conditioning unit and cause bruising to mid right shin. EXAM: RIGHT TIBIA AND FIBULA - 2 VIEW COMPARISON:  None. FINDINGS: Normal bone mineralization. Minimal degenerative osteoph

## 2021-09-13 ENCOUNTER — Ambulatory Visit
Admission: RE | Admit: 2021-09-13 | Discharge: 2021-09-13 | Disposition: A | Discharge status: Home / Self Care | Payer: BC Managed Care – PPO | Source: Ambulatory Visit | Attending: Family Medicine | Admitting: Family Medicine

## 2021-09-13 DIAGNOSIS — Z1231 Encounter for screening mammogram for malignant neoplasm of breast: Secondary | ICD-10-CM

## 2022-01-17 ENCOUNTER — Other Ambulatory Visit: Payer: Self-pay | Admitting: Oncology

## 2022-01-18 ENCOUNTER — Encounter: Payer: Self-pay | Admitting: Oncology

## 2022-03-11 ENCOUNTER — Inpatient Hospital Stay: Payer: BC Managed Care – PPO | Attending: Oncology

## 2022-03-11 DIAGNOSIS — Z79899 Other long term (current) drug therapy: Secondary | ICD-10-CM | POA: Diagnosis not present

## 2022-03-11 DIAGNOSIS — F1290 Cannabis use, unspecified, uncomplicated: Secondary | ICD-10-CM | POA: Diagnosis not present

## 2022-03-11 DIAGNOSIS — D631 Anemia in chronic kidney disease: Secondary | ICD-10-CM | POA: Insufficient documentation

## 2022-03-11 DIAGNOSIS — N183 Chronic kidney disease, stage 3 unspecified: Secondary | ICD-10-CM | POA: Insufficient documentation

## 2022-03-11 DIAGNOSIS — Z8349 Family history of other endocrine, nutritional and metabolic diseases: Secondary | ICD-10-CM | POA: Diagnosis not present

## 2022-03-11 DIAGNOSIS — R251 Tremor, unspecified: Secondary | ICD-10-CM | POA: Insufficient documentation

## 2022-03-11 DIAGNOSIS — N1832 Chronic kidney disease, stage 3b: Secondary | ICD-10-CM

## 2022-03-11 DIAGNOSIS — Z803 Family history of malignant neoplasm of breast: Secondary | ICD-10-CM | POA: Insufficient documentation

## 2022-03-11 DIAGNOSIS — Z833 Family history of diabetes mellitus: Secondary | ICD-10-CM | POA: Diagnosis not present

## 2022-03-11 DIAGNOSIS — Z801 Family history of malignant neoplasm of trachea, bronchus and lung: Secondary | ICD-10-CM | POA: Insufficient documentation

## 2022-03-11 LAB — CBC WITH DIFFERENTIAL/PLATELET
Abs Immature Granulocytes: 0.03 10*3/uL (ref 0.00–0.07)
Basophils Absolute: 0.1 10*3/uL (ref 0.0–0.1)
Basophils Relative: 1 %
Eosinophils Absolute: 0.3 10*3/uL (ref 0.0–0.5)
Eosinophils Relative: 2 %
HCT: 38.9 % (ref 36.0–46.0)
Hemoglobin: 12.2 g/dL (ref 12.0–15.0)
Immature Granulocytes: 0 %
Lymphocytes Relative: 15 %
Lymphs Abs: 1.6 10*3/uL (ref 0.7–4.0)
MCH: 30.7 pg (ref 26.0–34.0)
MCHC: 31.4 g/dL (ref 30.0–36.0)
MCV: 97.7 fL (ref 80.0–100.0)
Monocytes Absolute: 0.7 10*3/uL (ref 0.1–1.0)
Monocytes Relative: 7 %
Neutro Abs: 7.8 10*3/uL — ABNORMAL HIGH (ref 1.7–7.7)
Neutrophils Relative %: 75 %
Platelets: 321 10*3/uL (ref 150–400)
RBC: 3.98 MIL/uL (ref 3.87–5.11)
RDW: 12.9 % (ref 11.5–15.5)
WBC: 10.4 10*3/uL (ref 4.0–10.5)
nRBC: 0 % (ref 0.0–0.2)

## 2022-03-11 LAB — IRON AND TIBC
Iron: 46 ug/dL (ref 28–170)
Saturation Ratios: 18 % (ref 10.4–31.8)
TIBC: 255 ug/dL (ref 250–450)
UIBC: 209 ug/dL

## 2022-03-11 LAB — FERRITIN: Ferritin: 23 ng/mL (ref 11–307)

## 2022-03-11 MED FILL — Iron Sucrose Inj 20 MG/ML (Fe Equiv): INTRAVENOUS | Qty: 10 | Status: AC

## 2022-03-14 ENCOUNTER — Inpatient Hospital Stay (HOSPITAL_BASED_OUTPATIENT_CLINIC_OR_DEPARTMENT_OTHER): Payer: BC Managed Care – PPO | Admitting: Oncology

## 2022-03-14 ENCOUNTER — Encounter: Payer: Self-pay | Admitting: Oncology

## 2022-03-14 ENCOUNTER — Inpatient Hospital Stay: Payer: BC Managed Care – PPO

## 2022-03-14 VITALS — BP 125/81 | HR 67 | Temp 96.6°F

## 2022-03-14 DIAGNOSIS — N1832 Chronic kidney disease, stage 3b: Secondary | ICD-10-CM | POA: Diagnosis not present

## 2022-03-14 DIAGNOSIS — D631 Anemia in chronic kidney disease: Secondary | ICD-10-CM

## 2022-03-14 DIAGNOSIS — N183 Chronic kidney disease, stage 3 unspecified: Secondary | ICD-10-CM | POA: Diagnosis not present

## 2022-03-14 MED ORDER — FERROUS SULFATE 325 (65 FE) MG PO TABS: 325.0000 mg | ORAL_TABLET | Freq: Two times a day (BID) | ORAL | 1 refills | Status: DC

## 2022-03-14 NOTE — Progress Notes (Signed)
Hematology/Oncology Progress note [phone] [phone]      Patient Care Team: Ardyth Man, PA-C as PCP - General (Family Medicine) Rickard Patience, MD as Consulting Physician (Hematology and Oncology)   ASSESSMENT & PLAN:   Anemia associated with stage 3 chronic renal failure (HCC) #Anemia of chronic kidney disease. Labs are reviewed and discussed with patient Normal hemoglobin and iron panel  No venofer needed.  Recommend her to continue oral iron supplementation.   Orders Placed This Encounter  Procedures   CBC with Differential/Platelet    M+6    Standing Status:   Future    Standing Expiration Date:   03/15/2023   Ferritin    M+6    Standing Status:   Future    Standing Expiration Date:   03/15/2023   Iron and TIBC([facility])    M+6    Standing Status:   Future    Standing Expiration Date:   03/15/2023   Follow up in 6 months.  All questions were answered. The patient knows to call the clinic with any problems, questions or concerns.  Rickard Patience, MD, PhD [facility] Hematology Oncology 03/14/2022   CHIEF COMPLAINTS/REASON FOR VISIT:  iron deficiency anemia  HISTORY OF PRESENTING ILLNESS:  Vickie Montoya is a  46 y.o.  female with PMH listed below was seen in consultation at the request of Rushie Chestnut, PA-C  for evaluation of iron deficiency anemia.   Reviewed patient's recent labs  10/24/2019, labs revealed anemia with hemoglobin of 10.3, WBC 13.5, platelet count 481, differential showed neutrophilia..  Ferritin 10. Reviewed patient's previous labs ordered by primary care physician's office, anemia is chronic onset , duration is since at least 2009.  Chronic decreased iron saturation and ferritin. No aggravating or improving factors. Patient follows up with neurology Dr. Malvin Johns for facial spasms, body jerking and tremors. Associated signs and symptoms: Patient reports fatigue.  Denies SOB with exertion.  Denies weight loss,  easy bruising, hematochezia, hemoptysis, hematuria. Context:  History of iron deficiency: Patient has been on oral iron supplementation Rectal bleeding: Denies Menstrual bleeding/ Vaginal bleeding : Patient reports normal flow, usually last 3 days. hematemesis or hemoptysis : denies Blood in urine : denies   INTERVAL HISTORY Vickie Montoya is a 46 y.o. female who has above history reviewed by me today presents for follow up visit for management of anemia.  Patient reports feeling well.  She was accompanied by her husband. No new complaints.    Review of Systems  Constitutional:  Negative for fatigue.  Respiratory:  Negative for cough and shortness of breath.   Cardiovascular:  Negative for chest pain.  Gastrointestinal:  Negative for abdominal pain.  Genitourinary:  Negative for dysuria.   Musculoskeletal:  Negative for back pain.  Neurological:        Body jerks patient spasms and tremors.  Psychiatric/Behavioral:  Negative for confusion.     MEDICAL HISTORY:  Past Medical History:  Diagnosis Date   Blind    Depression    Diabetes mellitus without complication (HCC)    Hypertension    Migraine     SURGICAL HISTORY: Past Surgical History:  Procedure Laterality Date   CESAREAN SECTION     ORIF TOE FRACTURE Left 01/18/2019   Procedure: OPEN REDUCTION INTERNAL FIXATION (ORIF) METATARSAL (TOE) FRACTURE;  Surgeon: Gwyneth Revels, DPM;  Location: [facility] ORS;  Service: Podiatry;  Laterality: Left;   TUBAL LIGATION     tubiligation      SOCIAL HISTORY:  Social History   Socioeconomic History   Marital status: Married    Spouse name: Not on file   Number of children: Not on file   Years of education: Not on file   Highest education level: Not on file  Occupational History   Occupation: disabled   Tobacco Use   Smoking status: Never   Smokeless tobacco: Never  Vaping Use   Vaping Use: Never used  Substance and Sexual Activity   Alcohol use: Yes    Comment:  occasional    Drug use: Yes    Types: Marijuana    Comment: occasional    Sexual activity: Not on file  Other Topics Concern   Not on file  Social History Narrative   Not on file   Social Determinants of Health   Financial Resource Strain: Not on file  Food Insecurity: Not on file  Transportation Needs: Not on file  Physical Activity: Not on file  Stress: Not on file  Social Connections: Not on file  Intimate Partner Violence: Not on file    FAMILY HISTORY: Family History  Problem Relation Age of Onset   Hyperlipidemia Mother    Diabetes Father    Breast cancer Maternal Aunt    Lung cancer Maternal Uncle     ALLERGIES:  is allergic to sitagliptin.  MEDICATIONS:  Current Outpatient Medications  Medication Sig Dispense Refill   amLODipine (NORVASC) 10 MG tablet Take 10 mg by mouth daily.     aspirin EC 81 MG tablet Take 81 mg by mouth at bedtime.      carvedilol (COREG) 12.5 MG tablet Take 25 mg by mouth 2 (two) times daily.     cetirizine (ZYRTEC) 10 MG tablet Take 10 mg by mouth daily.     CVS VITAMIN C 250 MG tablet TAKE 1 TABLET BY MOUTH EVERY DAY 100 tablet 1   cyanocobalamin 1000 MCG tablet Take by mouth.     Dulaglutide 0.75 MG/0.5ML SOPN Inject into the skin. Inject 0.5 mLs (0.75 mg total) subcutaneously once a week for 30 days     hydrALAZINE (APRESOLINE) 50 MG tablet Take 50 mg by mouth every 8 (eight) hours.     insulin detemir (LEVEMIR) 100 UNIT/ML injection Inject 54 Units into the skin at bedtime.      lisinopril (PRINIVIL,ZESTRIL) 20 MG tablet Take 20 mg by mouth daily.      pravastatin (PRAVACHOL) 10 MG tablet Take 10 mg by mouth at bedtime.      escitalopram (LEXAPRO) 10 MG tablet Take by mouth. (Patient not taking: Reported on 08/28/2020)     ferrous sulfate 325 (65 FE) MG tablet Take 1 tablet (325 mg total) by mouth 2 (two) times daily with a meal. 180 tablet 1   metroNIDAZOLE (FLAGYL) 500 MG tablet Take 500 mg by mouth 2 (two) times daily. (Patient not  taking: Reported on 03/14/2022)     No current facility-administered medications for this visit.     PHYSICAL EXAMINATION: ECOG PERFORMANCE STATUS: 2 - Symptomatic, <50% confined to bed Vitals:   03/14/22 1328  BP: 125/81  Pulse: 67  Temp: (!) 96.6 F (35.9 C)  SpO2: 96%   There were no vitals filed for this visit.   Physical Exam Constitutional:      General: She is not in acute distress. HENT:     Head: Normocephalic and atraumatic.  Eyes:     Comments: Not able to open her eyes.  Cardiovascular:     Rate and  Rhythm: Normal rate and regular rhythm.     Heart sounds: Normal heart sounds.  Pulmonary:     Effort: Pulmonary effort is normal. No respiratory distress.     Breath sounds: No wheezing.  Abdominal:     General: Bowel sounds are normal. There is no distension.  Musculoskeletal:        General: No deformity. Normal range of motion.     Cervical back: Normal range of motion and neck supple.  Skin:    General: Skin is warm and dry.     Findings: No erythema or rash.  Neurological:     Mental Status: She is alert. Mental status is at baseline.     Cranial Nerves: No cranial nerve deficit.     Coordination: Coordination normal.     Comments: Constant body jerks, grimace, movement  Psychiatric:        Mood and Affect: Mood normal.       LABORATORY DATA:  I have reviewed the data as listed    Latest Ref Rng & Units 03/11/2022    1:32 PM 09/06/2021    2:47 PM 03/03/2021    2:33 PM  CBC  WBC 4.0 - 10.5 K/uL 10.4  10.0  8.6   Hemoglobin 12.0 - 15.0 g/dL 95.2  84.1  32.4   Hematocrit 36.0 - 46.0 % 38.9  35.3  34.3   Platelets 150 - 400 K/uL 321  321  285       Latest Ref Rng & Units 09/06/2021    2:47 PM 02/10/2020    1:31 PM 11/12/2019    4:04 PM  CMP  Glucose 70 - 99 mg/dL 401  87  027   BUN 6 - 20 mg/dL 24  22  32   Creatinine 0.44 - 1.00 mg/dL 2.53  6.64  4.03   Sodium 135 - 145 mmol/L 136  140  138   Potassium 3.5 - 5.1 mmol/L 4.8  4.3  4.6    Chloride 98 - 111 mmol/L 106  112  105   CO2 22 - 32 mmol/L 24  22  28    Calcium 8.9 - 10.3 mg/dL 8.6  8.4  8.9   Total Protein 6.5 - 8.1 g/dL 7.6  7.4  7.6   Total Bilirubin 0.3 - 1.2 mg/dL 0.5  0.6  0.4   Alkaline Phos 38 - 126 U/L 47  46  54   AST 15 - 41 U/L 14  14  14    ALT 0 - 44 U/L 12  13  12       Iron/TIBC/Ferritin/ %Sat    Component Value Date/Time   IRON 46 03/11/2022 1332   TIBC 255 03/11/2022 1332   FERRITIN 23 03/11/2022 1332   IRONPCTSAT 18 03/11/2022 1332     RADIOGRAPHIC STUDIES: I have personally reviewed the radiological images as listed and agreed with the findings in the report. No results found.

## 2022-03-14 NOTE — Assessment & Plan Note (Signed)
#  Anemia of chronic kidney disease. Labs are reviewed and discussed with patient Normal hemoglobin and iron panel  No venofer needed.  Recommend her to continue oral iron supplementation.

## 2022-05-07 ENCOUNTER — Other Ambulatory Visit: Payer: Self-pay | Admitting: Oncology

## 2022-05-19 ENCOUNTER — Encounter: Payer: Self-pay | Admitting: Oncology

## 2022-05-20 ENCOUNTER — Encounter: Payer: Self-pay | Admitting: *Deleted

## 2022-05-23 ENCOUNTER — Encounter
Admission: RE | Disposition: A | Discharge status: Home / Self Care | Payer: Self-pay | Source: Home / Self Care | Attending: Gastroenterology

## 2022-05-23 ENCOUNTER — Ambulatory Visit
Admission: RE | Admit: 2022-05-23 | Discharge: 2022-05-23 | Disposition: A | Discharge status: Home / Self Care | Payer: BC Managed Care – PPO | Attending: Gastroenterology | Admitting: Gastroenterology

## 2022-05-23 ENCOUNTER — Ambulatory Visit: Payer: BC Managed Care – PPO | Admitting: Anesthesiology

## 2022-05-23 ENCOUNTER — Encounter: Payer: Self-pay | Admitting: *Deleted

## 2022-05-23 ENCOUNTER — Other Ambulatory Visit: Payer: Self-pay

## 2022-05-23 DIAGNOSIS — Z6836 Body mass index (BMI) 36.0-36.9, adult: Secondary | ICD-10-CM | POA: Diagnosis not present

## 2022-05-23 DIAGNOSIS — F32A Depression, unspecified: Secondary | ICD-10-CM | POA: Diagnosis not present

## 2022-05-23 DIAGNOSIS — I129 Hypertensive chronic kidney disease with stage 1 through stage 4 chronic kidney disease, or unspecified chronic kidney disease: Secondary | ICD-10-CM | POA: Diagnosis not present

## 2022-05-23 DIAGNOSIS — E1122 Type 2 diabetes mellitus with diabetic chronic kidney disease: Secondary | ICD-10-CM | POA: Diagnosis not present

## 2022-05-23 DIAGNOSIS — H548 Legal blindness, as defined in USA: Secondary | ICD-10-CM | POA: Insufficient documentation

## 2022-05-23 DIAGNOSIS — F419 Anxiety disorder, unspecified: Secondary | ICD-10-CM | POA: Diagnosis not present

## 2022-05-23 DIAGNOSIS — N183 Chronic kidney disease, stage 3 unspecified: Secondary | ICD-10-CM | POA: Insufficient documentation

## 2022-05-23 DIAGNOSIS — Z794 Long term (current) use of insulin: Secondary | ICD-10-CM | POA: Insufficient documentation

## 2022-05-23 DIAGNOSIS — D509 Iron deficiency anemia, unspecified: Secondary | ICD-10-CM | POA: Insufficient documentation

## 2022-05-23 DIAGNOSIS — K640 First degree hemorrhoids: Secondary | ICD-10-CM | POA: Diagnosis not present

## 2022-05-23 DIAGNOSIS — I10 Essential (primary) hypertension: Secondary | ICD-10-CM | POA: Diagnosis not present

## 2022-05-23 DIAGNOSIS — Z79899 Other long term (current) drug therapy: Secondary | ICD-10-CM | POA: Diagnosis not present

## 2022-05-23 HISTORY — DX: Chronic kidney disease, stage 3 unspecified: N18.30

## 2022-05-23 HISTORY — PX: COLONOSCOPY WITH PROPOFOL: SHX5780

## 2022-05-23 HISTORY — PX: ESOPHAGOGASTRODUODENOSCOPY (EGD) WITH PROPOFOL: SHX5813

## 2022-05-23 LAB — GLUCOSE, CAPILLARY: Glucose-Capillary: 134 mg/dL — ABNORMAL HIGH (ref 70–99)

## 2022-05-23 SURGERY — COLONOSCOPY WITH PROPOFOL
Anesthesia: General

## 2022-05-23 MED ORDER — PROPOFOL 500 MG/50ML IV EMUL
INTRAVENOUS | Status: DC | PRN
  Administered 2022-05-23: 125 ug/kg/min via INTRAVENOUS

## 2022-05-23 MED ORDER — GLYCOPYRROLATE 0.2 MG/ML IJ SOLN
INTRAMUSCULAR | Status: DC | PRN
  Administered 2022-05-23: .2 mg via INTRAVENOUS

## 2022-05-23 MED ORDER — LIDOCAINE HCL (CARDIAC) PF 100 MG/5ML IV SOSY
PREFILLED_SYRINGE | INTRAVENOUS | Status: DC | PRN
  Administered 2022-05-23: 100 mg via INTRAVENOUS

## 2022-05-23 MED ORDER — SODIUM CHLORIDE 0.9 % IV SOLN: INTRAVENOUS | Status: DC

## 2022-05-23 MED ORDER — LIDOCAINE HCL (PF) 2 % IJ SOLN
INTRAMUSCULAR | Status: AC
  Filled 2022-05-23: qty 5

## 2022-05-23 MED ORDER — PROPOFOL 10 MG/ML IV BOLUS
INTRAVENOUS | Status: AC
  Filled 2022-05-23: qty 20

## 2022-05-23 MED ORDER — GLYCOPYRROLATE 0.2 MG/ML IJ SOLN
INTRAMUSCULAR | Status: AC
  Filled 2022-05-23: qty 1

## 2022-05-23 MED ORDER — PROPOFOL 10 MG/ML IV BOLUS
INTRAVENOUS | Status: DC | PRN
  Administered 2022-05-23: 100 mg via INTRAVENOUS
  Administered 2022-05-23: 30 mg via INTRAVENOUS
  Administered 2022-05-23: 20 mg via INTRAVENOUS

## 2022-05-23 NOTE — Addendum Note (Signed)
Addendum  created 05/23/22 0958 by Ludie Hudon, [place], CRNA   Flowsheet accepted, LDA properties accepted

## 2022-05-23 NOTE — Anesthesia Postprocedure Evaluation (Signed)
Anesthesia Post Note  Patient: Vickie Montoya  Procedure(s) Performed: COLONOSCOPY WITH PROPOFOL ESOPHAGOGASTRODUODENOSCOPY (EGD) WITH PROPOFOL  Patient location during evaluation: PACU Anesthesia Type: General Level of consciousness: awake Pain management: satisfactory to patient Vital Signs Assessment: post-procedure vital signs reviewed and stable Respiratory status: nonlabored ventilation Cardiovascular status: stable Anesthetic complications: no  No notable events documented.   Last Vitals:  Vitals:   05/23/22 0922 05/23/22 0933  BP:  102/62  Pulse: 87   Resp: 17   Temp:    SpO2: 97%     Last Pain:  Vitals:   05/23/22 0916  TempSrc: Temporal  PainSc:                  [name],Vickie Hyneman

## 2022-05-23 NOTE — Op Note (Signed)
[facility] Gastroenterology Patient Name: Vickie Montoya Procedure Date: 05/23/2022 8:22 AM MRN: [id number] Account #: [id number] Date of Birth: 1976-04-24 Admit Type: Outpatient Age: 47 Room: [facility] ENDO ROOM 3 Gender: Female Note Status: Finalized Instrument Name: Park Meo [id number] Procedure:             Colonoscopy Indications:           Iron deficiency anemia Providers:             Andrey Farmer MD, MD Medicines:             Monitored Anesthesia Care Complications:         No immediate complications. Procedure:             Pre-Anesthesia Assessment:                        - Prior to the procedure, a History and Physical was                         performed, and patient medications and allergies were                         reviewed. The patient is competent. The risks and                         benefits of the procedure and the sedation options and                         risks were discussed with the patient. All questions                         were answered and informed consent was obtained.                         Patient identification and proposed procedure were                         verified by the physician, the nurse, the                         anesthesiologist, the anesthetist and the technician                         in the endoscopy suite. Mental Status Examination:                         alert and oriented. Airway Examination: normal                         oropharyngeal airway and neck mobility. Respiratory                         Examination: clear to auscultation. CV Examination:                         normal. Prophylactic Antibiotics: The patient does not                         require prophylactic antibiotics. Prior  Anticoagulants: The patient has taken no anticoagulant                         or antiplatelet agents except for aspirin. ASA Grade                         Assessment: III - A patient with  severe systemic                         disease. After reviewing the risks and benefits, the                         patient was deemed in satisfactory condition to                         undergo the procedure. The anesthesia plan was to use                         monitored anesthesia care (MAC). Immediately prior to                         administration of medications, the patient was                         re-assessed for adequacy to receive sedatives. The                         heart rate, respiratory rate, oxygen saturations,                         blood pressure, adequacy of pulmonary ventilation, and                         response to care were monitored throughout the                         procedure. The physical status of the patient was                         re-assessed after the procedure.                        After obtaining informed consent, the colonoscope was                         passed under direct vision. Throughout the procedure,                         the patient's blood pressure, pulse, and oxygen                         saturations were monitored continuously. The                         Colonoscope was introduced through the anus and                         advanced to the the terminal ileum. The colonoscopy  was performed without difficulty. The patient                         tolerated the procedure well. The quality of the bowel                         preparation was good. The terminal ileum, ileocecal                         valve, appendiceal orifice, and rectum were                         photographed. Findings:      The perianal and digital rectal examinations were normal.      The terminal ileum appeared normal.      Bleeding internal hemorrhoids were found during retroflexion. The       hemorrhoids were Grade I (internal hemorrhoids that do not prolapse).      The exam was otherwise without abnormality on direct and  retroflexion       views. Impression:            - The examined portion of the ileum was normal.                        - Bleeding internal hemorrhoids.                        - The examination was otherwise normal on direct and                         retroflexion views.                        - No specimens collected. Recommendation:        - Discharge patient to home.                        - Resume previous diet.                        - Continue present medications.                        - Repeat colonoscopy in 10 years for screening                         purposes.                        - Return to referring physician as previously                         scheduled. Procedure Code(s):     --- Professional ---                        [phone], Colonoscopy, flexible; diagnostic, including                         collection of specimen(s) by brushing or washing, when  performed (separate procedure) Diagnosis Code(s):     --- Professional ---                        K64.0, First degree hemorrhoids                        D50.9, Iron deficiency anemia, unspecified CPT copyright 2022 American Medical Association. All rights reserved. The codes documented in this report are preliminary and upon coder review may  be revised to meet current compliance requirements. Andrey Farmer MD, MD 05/23/2022 9:19:35 AM Number of Addenda: 0 Note Initiated On: 05/23/2022 8:22 AM Scope Withdrawal Time: 0 hours 7 minutes 34 seconds  Total Procedure Duration: 0 hours 11 minutes 4 seconds  Estimated Blood Loss:  Estimated blood loss: none.      [facility]

## 2022-05-23 NOTE — Progress Notes (Signed)
Patient is currently on her period and declines a urine pregnancy test.  Waiver signed and witnessed.  Anesthesiologist is aware.

## 2022-05-23 NOTE — H&P (Signed)
Outpatient short stay form Pre-procedure 05/23/2022  Lesly Rubenstein, MD  Primary Physician: Hilton Sinclair, PA-C  Reason for visit:  IDA  History of present illness:    47 y/o lady with history of dystonia, hypertension, obesity, and DM II here for EGD/Colonoscopy due to [place]. No blood thinners except aspirin, history of c-sections, no family history of GI malignancies.    Current Facility-Administered Medications:    0.9 %  sodium chloride infusion, , Intravenous, Continuous, Xylan Sheils, Hilton Cork, MD  Medications Prior to Admission  Medication Sig Dispense Refill Last Dose   amLODipine (NORVASC) 10 MG tablet Take 10 mg by mouth daily.   05/22/2022   aspirin EC 81 MG tablet Take 81 mg by mouth at bedtime.    05/22/2022   carvedilol (COREG) 12.5 MG tablet Take 25 mg by mouth 2 (two) times daily.   05/22/2022   [employer]S VITAMIN C 250 MG tablet TAKE 1 TABLET BY MOUTH EVERY DAY 100 tablet 1 05/22/2022   cyanocobalamin 1000 MCG tablet Take by mouth.   05/22/2022   ferrous sulfate 325 (65 FE) MG tablet Take 1 tablet (325 mg total) by mouth 2 (two) times daily with a meal. 180 tablet 1 Past Week   hydrALAZINE (APRESOLINE) 50 MG tablet Take 50 mg by mouth every 8 (eight) hours.   05/22/2022   insulin detemir (LEVEMIR) 100 UNIT/ML injection Inject 54 Units into the skin at bedtime.    Past Month   lisinopril (PRINIVIL,ZESTRIL) 20 MG tablet Take 20 mg by mouth daily.    05/22/2022   pravastatin (PRAVACHOL) 10 MG tablet Take 10 mg by mouth at bedtime.    05/22/2022   cetirizine (ZYRTEC) 10 MG tablet Take 10 mg by mouth daily. (Patient not taking: Reported on 05/23/2022)   Not Taking   Dulaglutide 0.75 MG/0.5ML SOPN Inject into the skin. Inject 0.5 mLs (0.75 mg total) subcutaneously once a week for 30 days   05/17/2022   escitalopram (LEXAPRO) 10 MG tablet Take by mouth. (Patient not taking: Reported on 08/28/2020)      metroNIDAZOLE (FLAGYL) 500 MG tablet Take 500 mg by mouth 2 (two) times daily. (Patient not  taking: Reported on 03/14/2022)        Allergies  Allergen Reactions   Sitagliptin Other (See Comments)    Hyperglycemia  Jardience      Past Medical History:  Diagnosis Date   Blind    Chronic kidney disease (CKD), stage III (moderate) (HCC)    Depression    Diabetes mellitus without complication ([place])    Hypertension    Migraine     Review of systems:  Otherwise negative.    Physical Exam  Gen: Alert, oriented. Appears stated age.  HEENT: PERRLA. Lungs: No respiratory distress CV: RRR Abd: soft, benign, no masses Ext: No edema    Planned procedures: Proceed with EGD/colonoscopy. The patient understands the nature of the planned procedure, indications, risks, alternatives and potential complications including but not limited to bleeding, infection, perforation, damage to internal organs and possible oversedation/side effects from anesthesia. The patient agrees and gives consent to proceed.  Please refer to procedure notes for findings, recommendations and patient disposition/instructions.     Lesly Rubenstein, MD [facility]

## 2022-05-23 NOTE — Transfer of Care (Signed)
Immediate Anesthesia Transfer of Care Note  Patient: Vickie Montoya  Procedure(s) Performed: COLONOSCOPY WITH PROPOFOL ESOPHAGOGASTRODUODENOSCOPY (EGD) WITH PROPOFOL  Patient Location: PACU and Endoscopy Unit  Anesthesia Type:General  Level of Consciousness: drowsy and patient cooperative  Airway & Oxygen Therapy: Patient Spontanous Breathing  Post-op Assessment: Report given to RN and Post -op Vital signs reviewed and stable  Post vital signs: Reviewed and stable  Last Vitals:  Vitals Value Taken Time  BP 107/61 05/23/22 0916  Temp    Pulse 87 05/23/22 0917  Resp 24 05/23/22 0917  SpO2 97 % 05/23/22 0917  Vitals shown include unvalidated device data.  Last Pain:  Vitals:   05/23/22 0810  TempSrc: Temporal  PainSc: 0-No pain         Complications: No notable events documented.

## 2022-05-23 NOTE — Anesthesia Preprocedure Evaluation (Signed)
Anesthesia Evaluation  Patient identified by MRN, date of birth, ID band Patient awake    Reviewed: Allergy & Precautions, NPO status , Patient's Chart, lab work & pertinent test results  Airway Mallampati: III  TM Distance: >3 FB Neck ROM: full    Dental  (+) Poor Dentition   Pulmonary neg pulmonary ROS, Patient abstained from smoking.   Pulmonary exam normal  + decreased breath sounds      Cardiovascular Exercise Tolerance: Poor hypertension, Pt. on medications negative cardio ROS Normal cardiovascular exam Rhythm:Regular     Neuro/Psych  Headaches  Anxiety Depression    Legally blind negative neurological ROS  negative psych ROS   GI/Hepatic negative GI ROS, Neg liver ROS,,,  Endo/Other  negative endocrine ROSdiabetes, Well Controlled, Type 1, Insulin Dependent  Morbid obesity  Renal/GU negative Renal ROS  negative genitourinary   Musculoskeletal   Abdominal  (+) + obese  Peds negative pediatric ROS (+)  Hematology negative hematology ROS (+) Blood dyscrasia, anemia   Anesthesia Other Findings Past Medical History: No date: Blind No date: Chronic kidney disease (CKD), stage III (moderate) (HCC) No date: Depression No date: Diabetes mellitus without complication (HCC) No date: Hypertension No date: Migraine  Past Surgical History: No date: CESAREAN SECTION No date: FRACTURE SURGERY 01/18/2019: ORIF TOE FRACTURE; Left     Comment:  Procedure: OPEN REDUCTION INTERNAL FIXATION (ORIF)               METATARSAL (TOE) FRACTURE;  Surgeon: Samara Deist, DPM;              Location: [facility] ORS;  Service: Podiatry;  Laterality:               Left; No date: TUBAL LIGATION No date: tubiligation     Reproductive/Obstetrics negative OB ROS                             Anesthesia Physical Anesthesia Plan  ASA: 3  Anesthesia Plan: General   Post-op Pain Management:    Induction:  Intravenous  PONV Risk Score and Plan: Propofol infusion and TIVA  Airway Management Planned: Natural Airway  Additional Equipment:   Intra-op Plan:   Post-operative Plan:   Informed Consent: I have reviewed the patients History and Physical, chart, labs and discussed the procedure including the risks, benefits and alternatives for the proposed anesthesia with the patient or authorized representative who has indicated his/her understanding and acceptance.     Dental Advisory Given  Plan Discussed with: CRNA and Surgeon  Anesthesia Plan Comments:        Anesthesia Quick Evaluation

## 2022-05-23 NOTE — Interval H&P Note (Signed)
History and Physical Interval Note:  05/23/2022 8:26 AM  Vickie Montoya  has presented today for surgery, with the diagnosis of IDA,IRREGULAR BOWEL HABITS.  The various methods of treatment have been discussed with the patient and family. After consideration of risks, benefits and other options for treatment, the patient has consented to  Procedure(s) with comments: COLONOSCOPY WITH PROPOFOL (N/A) - legally blind ESOPHAGOGASTRODUODENOSCOPY (EGD) WITH PROPOFOL (N/A) as a surgical intervention.  The patient's history has been reviewed, patient examined, no change in status, stable for surgery.  I have reviewed the patient's chart and labs.  Questions were answered to the patient's satisfaction.     Lesly Rubenstein  Ok to proceed with EGD/Colonoscopy

## 2022-05-23 NOTE — Op Note (Signed)
[facility] Gastroenterology Patient Name: Vickie Montoya Procedure Date: 05/23/2022 8:22 AM MRN: [id number] Account #: [id number] Date of Birth: Sep 17, 1975 Admit Type: Outpatient Age: 47 Room: [facility] ENDO ROOM 3 Gender: Female Note Status: Finalized Instrument Name: Upper Endoscope [id number] Procedure:             Upper GI endoscopy Indications:           Iron deficiency anemia Providers:             Andrey Farmer MD, MD Medicines:             Monitored Anesthesia Care Complications:         No immediate complications. Estimated blood loss:                         Minimal. Procedure:             Pre-Anesthesia Assessment:                        - Prior to the procedure, a History and Physical was                         performed, and patient medications and allergies were                         reviewed. The patient is competent. The risks and                         benefits of the procedure and the sedation options and                         risks were discussed with the patient. All questions                         were answered and informed consent was obtained.                         Patient identification and proposed procedure were                         verified by the physician, the nurse, the                         anesthesiologist, the anesthetist and the technician                         in the endoscopy suite. Mental Status Examination:                         alert and oriented. Airway Examination: normal                         oropharyngeal airway and neck mobility. Respiratory                         Examination: clear to auscultation. CV Examination:                         normal. Prophylactic Antibiotics: The patient does not  require prophylactic antibiotics. Prior                         Anticoagulants: The patient has taken no anticoagulant                         or antiplatelet agents except for aspirin. ASA  Grade                         Assessment: III - A patient with severe systemic                         disease. After reviewing the risks and benefits, the                         patient was deemed in satisfactory condition to                         undergo the procedure. The anesthesia plan was to use                         monitored anesthesia care (MAC). Immediately prior to                         administration of medications, the patient was                         re-assessed for adequacy to receive sedatives. The                         heart rate, respiratory rate, oxygen saturations,                         blood pressure, adequacy of pulmonary ventilation, and                         response to care were monitored throughout the                         procedure. The physical status of the patient was                         re-assessed after the procedure.                        After obtaining informed consent, the endoscope was                         passed under direct vision. Throughout the procedure,                         the patient's blood pressure, pulse, and oxygen                         saturations were monitored continuously. The Endoscope                         was introduced through the mouth, and advanced to the  second part of duodenum. The upper GI endoscopy was                         accomplished without difficulty. The patient tolerated                         the procedure well. Findings:      The examined esophagus was normal.      The entire examined stomach was normal. Biopsies were taken with a cold       forceps for Helicobacter pylori testing. Estimated blood loss was       minimal.      The examined duodenum was normal. Impression:            - Normal esophagus.                        - Normal stomach. Biopsied.                        - Normal examined duodenum. Recommendation:        - Discharge patient to home.                         - Resume previous diet.                        - Continue present medications.                        - Await pathology results.                        - Return to referring physician as previously                         scheduled. Procedure Code(s):     --- Professional ---                        [phone], Esophagogastroduodenoscopy, flexible,                         transoral; with biopsy, single or multiple Diagnosis Code(s):     --- Professional ---                        D50.9, Iron deficiency anemia, unspecified CPT copyright 2022 American Medical Association. All rights reserved. The codes documented in this report are preliminary and upon coder review may  be revised to meet current compliance requirements. Andrey Farmer MD, MD 05/23/2022 9:14:53 AM Number of Addenda: 0 Note Initiated On: 05/23/2022 8:22 AM Estimated Blood Loss:  Estimated blood loss was minimal.      [facility]

## 2022-05-24 ENCOUNTER — Encounter: Payer: Self-pay | Admitting: Gastroenterology

## 2022-05-24 LAB — SURGICAL PATHOLOGY

## 2022-09-12 ENCOUNTER — Inpatient Hospital Stay: Payer: BC Managed Care – PPO | Attending: Oncology

## 2022-09-12 DIAGNOSIS — Z8349 Family history of other endocrine, nutritional and metabolic diseases: Secondary | ICD-10-CM | POA: Insufficient documentation

## 2022-09-12 DIAGNOSIS — N183 Chronic kidney disease, stage 3 unspecified: Secondary | ICD-10-CM | POA: Insufficient documentation

## 2022-09-12 DIAGNOSIS — Z79899 Other long term (current) drug therapy: Secondary | ICD-10-CM | POA: Diagnosis not present

## 2022-09-12 DIAGNOSIS — Z833 Family history of diabetes mellitus: Secondary | ICD-10-CM | POA: Diagnosis not present

## 2022-09-12 DIAGNOSIS — E611 Iron deficiency: Secondary | ICD-10-CM | POA: Diagnosis not present

## 2022-09-12 DIAGNOSIS — R251 Tremor, unspecified: Secondary | ICD-10-CM | POA: Insufficient documentation

## 2022-09-12 DIAGNOSIS — Z801 Family history of malignant neoplasm of trachea, bronchus and lung: Secondary | ICD-10-CM | POA: Insufficient documentation

## 2022-09-12 DIAGNOSIS — D631 Anemia in chronic kidney disease: Secondary | ICD-10-CM | POA: Diagnosis present

## 2022-09-12 DIAGNOSIS — Z803 Family history of malignant neoplasm of breast: Secondary | ICD-10-CM | POA: Diagnosis not present

## 2022-09-12 DIAGNOSIS — E1122 Type 2 diabetes mellitus with diabetic chronic kidney disease: Secondary | ICD-10-CM | POA: Diagnosis not present

## 2022-09-12 LAB — CBC WITH DIFFERENTIAL/PLATELET
Abs Immature Granulocytes: 0.04 10*3/uL (ref 0.00–0.07)
Basophils Absolute: 0.1 10*3/uL (ref 0.0–0.1)
Basophils Relative: 1 %
Eosinophils Absolute: 0.3 10*3/uL (ref 0.0–0.5)
Eosinophils Relative: 3 %
HCT: 35.1 % — ABNORMAL LOW (ref 36.0–46.0)
Hemoglobin: 10.8 g/dL — ABNORMAL LOW (ref 12.0–15.0)
Immature Granulocytes: 0 %
Lymphocytes Relative: 14 %
Lymphs Abs: 1.6 10*3/uL (ref 0.7–4.0)
MCH: 29.9 pg (ref 26.0–34.0)
MCHC: 30.8 g/dL (ref 30.0–36.0)
MCV: 97.2 fL (ref 80.0–100.0)
Monocytes Absolute: 0.7 10*3/uL (ref 0.1–1.0)
Monocytes Relative: 7 %
Neutro Abs: 8.1 10*3/uL — ABNORMAL HIGH (ref 1.7–7.7)
Neutrophils Relative %: 75 %
Platelets: 331 10*3/uL (ref 150–400)
RBC: 3.61 MIL/uL — ABNORMAL LOW (ref 3.87–5.11)
RDW: 13.2 % (ref 11.5–15.5)
WBC: 10.8 10*3/uL — ABNORMAL HIGH (ref 4.0–10.5)
nRBC: 0 % (ref 0.0–0.2)

## 2022-09-12 LAB — IRON AND TIBC
Iron: 46 ug/dL (ref 28–170)
Saturation Ratios: 16 % (ref 10.4–31.8)
TIBC: 281 ug/dL (ref 250–450)
UIBC: 235 ug/dL

## 2022-09-12 LAB — FERRITIN: Ferritin: 23 ng/mL (ref 11–307)

## 2022-09-16 ENCOUNTER — Ambulatory Visit: Payer: BC Managed Care – PPO

## 2022-09-16 ENCOUNTER — Inpatient Hospital Stay (HOSPITAL_BASED_OUTPATIENT_CLINIC_OR_DEPARTMENT_OTHER): Payer: BC Managed Care – PPO | Admitting: Oncology

## 2022-09-16 ENCOUNTER — Inpatient Hospital Stay: Payer: BC Managed Care – PPO

## 2022-09-16 ENCOUNTER — Ambulatory Visit: Payer: BC Managed Care – PPO | Admitting: Oncology

## 2022-09-16 ENCOUNTER — Encounter: Payer: Self-pay | Admitting: Oncology

## 2022-09-16 VITALS — BP 110/64 | HR 72

## 2022-09-16 VITALS — BP 103/70 | HR 77 | Temp 96.4°F | Resp 18

## 2022-09-16 DIAGNOSIS — D631 Anemia in chronic kidney disease: Secondary | ICD-10-CM

## 2022-09-16 DIAGNOSIS — N183 Chronic kidney disease, stage 3 unspecified: Secondary | ICD-10-CM

## 2022-09-16 DIAGNOSIS — D509 Iron deficiency anemia, unspecified: Secondary | ICD-10-CM

## 2022-09-16 MED ORDER — SODIUM CHLORIDE 0.9 % IV SOLN
Freq: Once | INTRAVENOUS | Status: AC
  Filled 2022-09-16: qty 250

## 2022-09-16 MED ORDER — SODIUM CHLORIDE 0.9% FLUSH
10.0000 mL | Freq: Once | INTRAVENOUS | Status: AC | PRN
  Administered 2022-09-16: 10 mL
  Filled 2022-09-16: qty 10

## 2022-09-16 MED ORDER — SODIUM CHLORIDE 0.9 % IV SOLN
200.0000 mg | Freq: Once | INTRAVENOUS | Status: AC
  Administered 2022-09-16: 200 mg via INTRAVENOUS
  Filled 2022-09-16: qty 200

## 2022-09-16 NOTE — Assessment & Plan Note (Addendum)
#  Anemia of chronic kidney disease. Labs are reviewed and discussed with patient Lab Results  Component Value Date   HGB 10.8 (L) 09/12/2022   TIBC 281 09/12/2022   IRONPCTSAT 16 09/12/2022   FERRITIN 23 09/12/2022    Recommend patient to get IV Venofer weekly x 3 to further improve iron store.

## 2022-09-16 NOTE — Progress Notes (Signed)
Patient tolerated infusion well, no questions/concerns voiced. Monitored 30 min post transfusion. Patient stable at discharge with husband. VSS. AVS given.

## 2022-09-16 NOTE — Progress Notes (Signed)
Hematology/Oncology Progress note [phone] [phone]      Patient Care Team: Ardyth Man, PA-C as PCP - General (Family Medicine) Rickard Patience, MD as Consulting Physician (Hematology and Oncology)   ASSESSMENT & PLAN:   Anemia associated with stage 3 chronic renal failure (HCC) #Anemia of chronic kidney disease. Labs are reviewed and discussed with patient Lab Results  Component Value Date   HGB 10.8 (L) 09/12/2022   TIBC 281 09/12/2022   IRONPCTSAT 16 09/12/2022   FERRITIN 23 09/12/2022    Recommend patient to get IV Venofer weekly x 3 to further improve iron store.  Orders Placed This Encounter  Procedures   CBC with Differential (Cancer Center Only)    Standing Status:   Future    Standing Expiration Date:   09/16/2023   Iron and TIBC    Standing Status:   Future    Standing Expiration Date:   09/16/2023   Ferritin    Standing Status:   Future    Standing Expiration Date:   09/16/2023   Retic Panel    Standing Status:   Future    Standing Expiration Date:   09/16/2023   Follow up in 4 months.  All questions were answered. The patient knows to call the clinic with any problems, questions or concerns.  Rickard Patience, MD, PhD [facility] Hematology Oncology 09/16/2022   CHIEF COMPLAINTS/REASON FOR VISIT:  iron deficiency anemia  HISTORY OF PRESENTING ILLNESS:  Vickie Montoya is a  47 y.o.  female with PMH listed below was seen in consultation at the request of Ardyth Man, [place]  for evaluation of iron deficiency anemia.   Reviewed patient's recent labs  10/24/2019, labs revealed anemia with hemoglobin of 10.3, WBC 13.5, platelet count 481, differential showed neutrophilia..  Ferritin 10. Reviewed patient's previous labs ordered by primary care physician's office, anemia is chronic onset , duration is since at least 2009.  Chronic decreased iron saturation and ferritin. No aggravating or improving factors. Patient follows up with neurology  Dr. Malvin Johns for facial spasms, body jerking and tremors. Associated signs and symptoms: Patient reports fatigue.  Denies SOB with exertion.  Denies weight loss, easy bruising, hematochezia, hemoptysis, hematuria. Context:  History of iron deficiency: Patient has been on oral iron supplementation Rectal bleeding: Denies Menstrual bleeding/ Vaginal bleeding : Patient reports normal flow, usually last 3 days. hematemesis or hemoptysis : denies Blood in urine : denies   INTERVAL HISTORY Vickie Montoya is a 47 y.o. female who has above history reviewed by me today presents for follow up visit for management of anemia.  Patient reports feeling well.  She was accompanied by her husband. No new complaints.  Patient takes oral iron supplementation.   Review of Systems  Constitutional:  Negative for fatigue.  Respiratory:  Negative for cough and shortness of breath.   Cardiovascular:  Negative for chest pain.  Gastrointestinal:  Negative for abdominal pain.  Genitourinary:  Negative for dysuria.   Musculoskeletal:  Negative for back pain.  Neurological:        Body jerks patient spasms and tremors.  Psychiatric/Behavioral:  Negative for confusion.     MEDICAL HISTORY:  Past Medical History:  Diagnosis Date   Blind    Chronic kidney disease (CKD), stage III (moderate) (HCC)    Depression    Diabetes mellitus without complication (HCC)    Hypertension    Migraine     SURGICAL HISTORY: Past Surgical History:  Procedure Laterality Date  CESAREAN SECTION     COLONOSCOPY WITH PROPOFOL N/A 05/23/2022   Procedure: COLONOSCOPY WITH PROPOFOL;  Surgeon: Regis Bill, MD;  Location: [facility] ENDOSCOPY;  Service: Endoscopy;  Laterality: N/A;  legally blind   ESOPHAGOGASTRODUODENOSCOPY (EGD) WITH PROPOFOL N/A 05/23/2022   Procedure: ESOPHAGOGASTRODUODENOSCOPY (EGD) WITH PROPOFOL;  Surgeon: Regis Bill, MD;  Location: [facility] ENDOSCOPY;  Service: Endoscopy;  Laterality: N/A;   FRACTURE  SURGERY     ORIF TOE FRACTURE Left 01/18/2019   Procedure: OPEN REDUCTION INTERNAL FIXATION (ORIF) METATARSAL (TOE) FRACTURE;  Surgeon: Gwyneth Revels, DPM;  Location: [facility] ORS;  Service: Podiatry;  Laterality: Left;   TUBAL LIGATION     tubiligation      SOCIAL HISTORY: Social History   Socioeconomic History   Marital status: Married    Spouse name: Not on file   Number of children: Not on file   Years of education: Not on file   Highest education level: Not on file  Occupational History   Occupation: disabled   Tobacco Use   Smoking status: Never   Smokeless tobacco: Never  Vaping Use   Vaping Use: Never used  Substance and Sexual Activity   Alcohol use: Yes    Comment: occasional    Drug use: Yes    Types: Marijuana    Comment: occasional this am 05/23/2022   Sexual activity: Not on file  Other Topics Concern   Not on file  Social History Narrative   Not on file   Social Determinants of Health   Financial Resource Strain: Not on file  Food Insecurity: Not on file  Transportation Needs: Not on file  Physical Activity: Not on file  Stress: Not on file  Social Connections: Not on file  Intimate Partner Violence: Not on file    FAMILY HISTORY: Family History  Problem Relation Age of Onset   Hyperlipidemia Mother    Diabetes Father    Breast cancer Maternal Aunt    Lung cancer Maternal Uncle     ALLERGIES:  is allergic to sitagliptin.  MEDICATIONS:  Current Outpatient Medications  Medication Sig Dispense Refill   amLODipine (NORVASC) 10 MG tablet Take 10 mg by mouth daily.     aspirin EC 81 MG tablet Take 81 mg by mouth at bedtime.      carvedilol (COREG) 12.5 MG tablet Take 25 mg by mouth 2 (two) times daily.     [employer]S VITAMIN C 250 MG tablet TAKE 1 TABLET BY MOUTH EVERY DAY 100 tablet 1   cyanocobalamin 1000 MCG tablet Take by mouth.     Dulaglutide 0.75 MG/0.5ML SOPN Inject into the skin. Inject 0.5 mLs (0.75 mg total) subcutaneously once a week for 30  days     ferrous sulfate 325 (65 FE) MG tablet Take 1 tablet (325 mg total) by mouth 2 (two) times daily with a meal. 180 tablet 1   hydrALAZINE (APRESOLINE) 50 MG tablet Take 50 mg by mouth every 8 (eight) hours.     LANTUS SOLOSTAR 100 UNIT/ML Solostar Pen Inject into the skin.     lisinopril (PRINIVIL,ZESTRIL) 20 MG tablet Take 20 mg by mouth daily.      pravastatin (PRAVACHOL) 10 MG tablet Take 10 mg by mouth at bedtime.      cetirizine (ZYRTEC) 10 MG tablet Take 10 mg by mouth daily. (Patient not taking: Reported on 05/23/2022)     escitalopram (LEXAPRO) 10 MG tablet Take by mouth. (Patient not taking: Reported on 08/28/2020)  insulin detemir (LEVEMIR) 100 UNIT/ML injection Inject 54 Units into the skin at bedtime.  (Patient not taking: Reported on 09/16/2022)     metroNIDAZOLE (FLAGYL) 500 MG tablet Take 500 mg by mouth 2 (two) times daily. (Patient not taking: Reported on 03/14/2022)     No current facility-administered medications for this visit.     PHYSICAL EXAMINATION: ECOG PERFORMANCE STATUS: 2 - Symptomatic, <50% confined to bed Vitals:   09/16/22 1208  BP: 103/70  Pulse: 77  Resp: 18  Temp: (!) 96.4 F (35.8 C)  SpO2: 96%   There were no vitals filed for this visit.   Physical Exam Constitutional:      General: She is not in acute distress. HENT:     Head: Normocephalic and atraumatic.  Eyes:     Comments: Not able to open her eyes.  Cardiovascular:     Rate and Rhythm: Normal rate and regular rhythm.     Heart sounds: Normal heart sounds.  Pulmonary:     Effort: Pulmonary effort is normal. No respiratory distress.     Breath sounds: No wheezing.  Abdominal:     General: Bowel sounds are normal. There is no distension.  Musculoskeletal:        General: No deformity. Normal range of motion.     Cervical back: Normal range of motion and neck supple.  Skin:    General: Skin is warm and dry.     Findings: No erythema or rash.  Neurological:     Mental  Status: She is alert. Mental status is at baseline.     Cranial Nerves: No cranial nerve deficit.     Coordination: Coordination normal.     Comments: Constant body jerks, grimace, movement  Psychiatric:        Mood and Affect: Mood normal.       LABORATORY DATA:  I have reviewed the data as listed    Latest Ref Rng & Units 09/12/2022    2:16 PM 03/11/2022    1:32 PM 09/06/2021    2:47 PM  CBC  WBC 4.0 - 10.5 K/uL 10.8  10.4  10.0   Hemoglobin 12.0 - 15.0 g/dL 82.9  56.2  13.0   Hematocrit 36.0 - 46.0 % 35.1  38.9  35.3   Platelets 150 - 400 K/uL 331  321  321       Latest Ref Rng & Units 09/06/2021    2:47 PM 02/10/2020    1:31 PM 11/12/2019    4:04 PM  CMP  Glucose 70 - 99 mg/dL 865  87  784   BUN 6 - 20 mg/dL 24  22  32   Creatinine 0.44 - 1.00 mg/dL 6.96  2.95  2.84   Sodium 135 - 145 mmol/L 136  140  138   Potassium 3.5 - 5.1 mmol/L 4.8  4.3  4.6   Chloride 98 - 111 mmol/L 106  112  105   CO2 22 - 32 mmol/L 24  22  28    Calcium 8.9 - 10.3 mg/dL 8.6  8.4  8.9   Total Protein 6.5 - 8.1 g/dL 7.6  7.4  7.6   Total Bilirubin 0.3 - 1.2 mg/dL 0.5  0.6  0.4   Alkaline Phos 38 - 126 U/L 47  46  54   AST 15 - 41 U/L 14  14  14    ALT 0 - 44 U/L 12  13  12       Iron/TIBC/Ferritin/ %Sat  Component Value Date/Time   IRON 46 09/12/2022 1416   TIBC 281 09/12/2022 1416   FERRITIN 23 09/12/2022 1416   IRONPCTSAT 16 09/12/2022 1416     RADIOGRAPHIC STUDIES: I have personally reviewed the radiological images as listed and agreed with the findings in the report. No results found.

## 2022-09-16 NOTE — Patient Instructions (Signed)

## 2022-09-23 ENCOUNTER — Inpatient Hospital Stay: Payer: BC Managed Care – PPO

## 2022-09-23 VITALS — BP 114/73 | HR 71 | Temp 96.5°F | Resp 16

## 2022-09-23 DIAGNOSIS — N183 Chronic kidney disease, stage 3 unspecified: Secondary | ICD-10-CM | POA: Diagnosis not present

## 2022-09-23 DIAGNOSIS — D509 Iron deficiency anemia, unspecified: Secondary | ICD-10-CM

## 2022-09-23 MED ORDER — SODIUM CHLORIDE 0.9 % IV SOLN
Freq: Once | INTRAVENOUS | Status: AC
  Filled 2022-09-23: qty 250

## 2022-09-23 MED ORDER — SODIUM CHLORIDE 0.9 % IV SOLN
200.0000 mg | Freq: Once | INTRAVENOUS | Status: AC
  Administered 2022-09-23: 200 mg via INTRAVENOUS
  Filled 2022-09-23: qty 200

## 2022-09-23 NOTE — Patient Instructions (Signed)

## 2022-09-30 ENCOUNTER — Inpatient Hospital Stay: Payer: BC Managed Care – PPO

## 2022-09-30 VITALS — BP 106/63 | HR 69 | Temp 97.8°F | Resp 17

## 2022-09-30 DIAGNOSIS — D509 Iron deficiency anemia, unspecified: Secondary | ICD-10-CM

## 2022-09-30 DIAGNOSIS — N183 Chronic kidney disease, stage 3 unspecified: Secondary | ICD-10-CM | POA: Diagnosis not present

## 2022-09-30 MED ORDER — SODIUM CHLORIDE 0.9 % IV SOLN
Freq: Once | INTRAVENOUS | Status: AC
  Filled 2022-09-30: qty 250

## 2022-09-30 MED ORDER — SODIUM CHLORIDE 0.9 % IV SOLN
200.0000 mg | Freq: Once | INTRAVENOUS | Status: AC
  Administered 2022-09-30: 200 mg via INTRAVENOUS
  Filled 2022-09-30: qty 200

## 2023-01-17 ENCOUNTER — Inpatient Hospital Stay: Payer: BC Managed Care – PPO | Attending: Oncology

## 2023-01-17 DIAGNOSIS — E611 Iron deficiency: Secondary | ICD-10-CM | POA: Diagnosis not present

## 2023-01-17 DIAGNOSIS — I129 Hypertensive chronic kidney disease with stage 1 through stage 4 chronic kidney disease, or unspecified chronic kidney disease: Secondary | ICD-10-CM | POA: Diagnosis not present

## 2023-01-17 DIAGNOSIS — Z79899 Other long term (current) drug therapy: Secondary | ICD-10-CM | POA: Diagnosis not present

## 2023-01-17 DIAGNOSIS — Z833 Family history of diabetes mellitus: Secondary | ICD-10-CM | POA: Insufficient documentation

## 2023-01-17 DIAGNOSIS — E1122 Type 2 diabetes mellitus with diabetic chronic kidney disease: Secondary | ICD-10-CM | POA: Diagnosis not present

## 2023-01-17 DIAGNOSIS — R251 Tremor, unspecified: Secondary | ICD-10-CM | POA: Diagnosis not present

## 2023-01-17 DIAGNOSIS — N183 Chronic kidney disease, stage 3 unspecified: Secondary | ICD-10-CM | POA: Diagnosis present

## 2023-01-17 DIAGNOSIS — Z83438 Family history of other disorder of lipoprotein metabolism and other lipidemia: Secondary | ICD-10-CM | POA: Diagnosis not present

## 2023-01-17 DIAGNOSIS — R5383 Other fatigue: Secondary | ICD-10-CM | POA: Diagnosis not present

## 2023-01-17 DIAGNOSIS — D631 Anemia in chronic kidney disease: Secondary | ICD-10-CM | POA: Diagnosis present

## 2023-01-17 DIAGNOSIS — Z801 Family history of malignant neoplasm of trachea, bronchus and lung: Secondary | ICD-10-CM | POA: Insufficient documentation

## 2023-01-17 DIAGNOSIS — Z803 Family history of malignant neoplasm of breast: Secondary | ICD-10-CM | POA: Insufficient documentation

## 2023-01-17 LAB — CBC WITH DIFFERENTIAL (CANCER CENTER ONLY)
Abs Immature Granulocytes: 0.05 10*3/uL (ref 0.00–0.07)
Basophils Absolute: 0.1 10*3/uL (ref 0.0–0.1)
Basophils Relative: 1 %
Eosinophils Absolute: 0.2 10*3/uL (ref 0.0–0.5)
Eosinophils Relative: 2 %
HCT: 33 % — ABNORMAL LOW (ref 36.0–46.0)
Hemoglobin: 10.2 g/dL — ABNORMAL LOW (ref 12.0–15.0)
Immature Granulocytes: 1 %
Lymphocytes Relative: 18 %
Lymphs Abs: 1.9 10*3/uL (ref 0.7–4.0)
MCH: 30 pg (ref 26.0–34.0)
MCHC: 30.9 g/dL (ref 30.0–36.0)
MCV: 97.1 fL (ref 80.0–100.0)
Monocytes Absolute: 0.9 10*3/uL (ref 0.1–1.0)
Monocytes Relative: 8 %
Neutro Abs: 7.4 10*3/uL (ref 1.7–7.7)
Neutrophils Relative %: 70 %
Platelet Count: 281 10*3/uL (ref 150–400)
RBC: 3.4 MIL/uL — ABNORMAL LOW (ref 3.87–5.11)
RDW: 13.6 % (ref 11.5–15.5)
WBC Count: 10.4 10*3/uL (ref 4.0–10.5)
nRBC: 0 % (ref 0.0–0.2)

## 2023-01-17 LAB — RETIC PANEL
Immature Retic Fract: 7.6 % (ref 2.3–15.9)
RBC.: 3.38 MIL/uL — ABNORMAL LOW (ref 3.87–5.11)
Retic Count, Absolute: 46.3 10*3/uL (ref 19.0–186.0)
Retic Ct Pct: 1.4 % (ref 0.4–3.1)
Reticulocyte Hemoglobin: 30.4 pg (ref >27.9)

## 2023-01-17 LAB — FERRITIN: Ferritin: 14 ng/mL (ref 11–307)

## 2023-01-17 LAB — IRON AND TIBC
Iron: 26 ug/dL — ABNORMAL LOW (ref 28–170)
Saturation Ratios: 12 % (ref 10.4–31.8)
TIBC: 227 ug/dL — ABNORMAL LOW (ref 250–450)
UIBC: 201 ug/dL

## 2023-01-18 ENCOUNTER — Inpatient Hospital Stay (HOSPITAL_BASED_OUTPATIENT_CLINIC_OR_DEPARTMENT_OTHER): Payer: BC Managed Care – PPO | Admitting: Oncology

## 2023-01-18 ENCOUNTER — Inpatient Hospital Stay: Payer: BC Managed Care – PPO

## 2023-01-18 ENCOUNTER — Encounter: Payer: Self-pay | Admitting: Oncology

## 2023-01-18 VITALS — BP 113/50 | HR 58

## 2023-01-18 VITALS — BP 97/58 | HR 57 | Temp 97.6°F | Resp 18

## 2023-01-18 DIAGNOSIS — D631 Anemia in chronic kidney disease: Secondary | ICD-10-CM | POA: Diagnosis not present

## 2023-01-18 DIAGNOSIS — N183 Chronic kidney disease, stage 3 unspecified: Secondary | ICD-10-CM | POA: Diagnosis not present

## 2023-01-18 DIAGNOSIS — D509 Iron deficiency anemia, unspecified: Secondary | ICD-10-CM

## 2023-01-18 DIAGNOSIS — I129 Hypertensive chronic kidney disease with stage 1 through stage 4 chronic kidney disease, or unspecified chronic kidney disease: Secondary | ICD-10-CM | POA: Diagnosis not present

## 2023-01-18 MED ORDER — SODIUM CHLORIDE 0.9 % IV SOLN
INTRAVENOUS | Status: DC
  Filled 2023-01-18 (×2): qty 250

## 2023-01-18 MED ORDER — SODIUM CHLORIDE 0.9 % IV SOLN
200.0000 mg | Freq: Once | INTRAVENOUS | Status: AC
  Administered 2023-01-18: 200 mg via INTRAVENOUS
  Filled 2023-01-18: qty 10

## 2023-01-18 MED ORDER — SODIUM CHLORIDE 0.9% FLUSH
10.0000 mL | Freq: Once | INTRAVENOUS | Status: DC
  Filled 2023-01-18: qty 10

## 2023-01-18 NOTE — Progress Notes (Signed)
Hematology/Oncology Progress note [phone] [phone]      Patient Care Team: Ardyth Man, PA-C as PCP - General (Family Medicine) Rickard Patience, MD as Consulting Physician (Hematology and Oncology)   ASSESSMENT & PLAN:   Anemia associated with stage 3 chronic renal failure (HCC) Anemia of chronic kidney disease. Labs are reviewed and discussed with patient Lab Results  Component Value Date   HGB 10.2 (L) 01/17/2023   TIBC 227 (L) 01/17/2023   IRONPCTSAT 12 01/17/2023   FERRITIN 14 01/17/2023    Recommend patient to get IV Venofer weekly x 4 to further improve iron store.  Orders Placed This Encounter  Procedures   CBC with Differential (Cancer Center Only)    Standing Status:   Future    Standing Expiration Date:   01/18/2024   Iron and TIBC    Standing Status:   Future    Standing Expiration Date:   01/18/2024   Ferritin    Standing Status:   Future    Standing Expiration Date:   01/18/2024   Retic Panel    Standing Status:   Future    Standing Expiration Date:   01/18/2024   Vitamin B12    Standing Status:   Future    Standing Expiration Date:   01/18/2024   Folate    Standing Status:   Future    Standing Expiration Date:   01/18/2024   Follow up in 3 months.  All questions were answered. The patient knows to call the clinic with any problems, questions or concerns.  Rickard Patience, MD, PhD [facility] Hematology Oncology 01/18/2023   CHIEF COMPLAINTS/REASON FOR VISIT:  iron deficiency anemia  HISTORY OF PRESENTING ILLNESS:  Vickie Montoya is a  47 y.o.  female with PMH listed below was seen in consultation at the request of Ardyth Man, [place]  for evaluation of iron deficiency anemia.   Reviewed patient's recent labs  10/24/2019, labs revealed anemia with hemoglobin of 10.3, WBC 13.5, platelet count 481, differential showed neutrophilia..  Ferritin 10. Reviewed patient's previous labs ordered by primary care physician's office, anemia is  chronic onset , duration is since at least 2009.  Chronic decreased iron saturation and ferritin. No aggravating or improving factors. Patient follows up with neurology Dr. Malvin Johns for facial spasms, body jerking and tremors. Associated signs and symptoms: Patient reports fatigue.  Denies SOB with exertion.  Denies weight loss, easy bruising, hematochezia, hemoptysis, hematuria. Context:  History of iron deficiency: Patient has been on oral iron supplementation Rectal bleeding: Denies Menstrual bleeding/ Vaginal bleeding : Patient reports normal flow, usually last 3 days. hematemesis or hemoptysis : denies Blood in urine : denies   INTERVAL HISTORY Vickie Montoya is a 47 y.o. female who has above history reviewed by me today presents for follow up visit for management of anemia.  Patient reports feeling well.  She was accompanied by her husband. No new complaints.  + fatigue  Review of Systems  Constitutional:  Positive for fatigue.  Respiratory:  Negative for cough and shortness of breath.   Cardiovascular:  Negative for chest pain.  Gastrointestinal:  Negative for abdominal pain.  Genitourinary:  Negative for dysuria.   Musculoskeletal:  Negative for back pain.  Neurological:        Body jerks patient spasms and tremors.  Psychiatric/Behavioral:  Negative for confusion.     MEDICAL HISTORY:  Past Medical History:  Diagnosis Date   Blind    Chronic kidney disease (CKD), stage  III (moderate) (HCC)    Depression    Diabetes mellitus without complication (HCC)    Hypertension    Migraine     SURGICAL HISTORY: Past Surgical History:  Procedure Laterality Date   CESAREAN SECTION     COLONOSCOPY WITH PROPOFOL N/A 05/23/2022   Procedure: COLONOSCOPY WITH PROPOFOL;  Surgeon: Regis Bill, MD;  Location: [facility] ENDOSCOPY;  Service: Endoscopy;  Laterality: N/A;  legally blind   ESOPHAGOGASTRODUODENOSCOPY (EGD) WITH PROPOFOL N/A 05/23/2022   Procedure:  ESOPHAGOGASTRODUODENOSCOPY (EGD) WITH PROPOFOL;  Surgeon: Regis Bill, MD;  Location: [facility] ENDOSCOPY;  Service: Endoscopy;  Laterality: N/A;   FRACTURE SURGERY     ORIF TOE FRACTURE Left 01/18/2019   Procedure: OPEN REDUCTION INTERNAL FIXATION (ORIF) METATARSAL (TOE) FRACTURE;  Surgeon: Gwyneth Revels, DPM;  Location: [facility] ORS;  Service: Podiatry;  Laterality: Left;   TUBAL LIGATION     tubiligation      SOCIAL HISTORY: Social History   Socioeconomic History   Marital status: Married    Spouse name: Not on file   Number of children: Not on file   Years of education: Not on file   Highest education level: Not on file  Occupational History   Occupation: disabled   Tobacco Use   Smoking status: Never   Smokeless tobacco: Never  Vaping Use   Vaping status: Never Used  Substance and Sexual Activity   Alcohol use: Yes    Comment: occasional    Drug use: Yes    Types: Marijuana    Comment: occasional this am 05/23/2022   Sexual activity: Not on file  Other Topics Concern   Not on file  Social History Narrative   Not on file   Social Determinants of Health   Financial Resource Strain: Low Risk  (05/04/2022)   Received from [facility], [facility]   Overall Financial Resource Strain (CARDIA)    Difficulty of Paying Living Expenses: Not hard at all  Food Insecurity: Food Insecurity Present (05/04/2022)   Received from [facility], [facility]   Hunger Vital Sign    Worried About Running Out of Food in the Last Year: Sometimes true    Ran Out of Food in the Last Year: Sometimes true  Transportation Needs: No Transportation Needs (05/04/2022)   Received from [facility], [facility]   [facility] - Transportation    In the past 12 months, has lack of transportation kept you from medical appointments or from getting medications?: No    Lack of Transportation (Non-Medical):  No  Physical Activity: Not on file  Stress: Not on file  Social Connections: Not on file  Intimate Partner Violence: Not on file    FAMILY HISTORY: Family History  Problem Relation Age of Onset   Hyperlipidemia Mother    Diabetes Father    Breast cancer Maternal Aunt    Lung cancer Maternal Uncle     ALLERGIES:  is allergic to sitagliptin.  MEDICATIONS:  Current Outpatient Medications  Medication Sig Dispense Refill   amLODipine (NORVASC) 10 MG tablet Take 10 mg by mouth daily.     aspirin EC 81 MG tablet Take 81 mg by mouth at bedtime.      carvedilol (COREG) 12.5 MG tablet Take 25 mg by mouth 2 (two) times daily.     cetirizine (ZYRTEC) 10 MG tablet Take 10 mg by mouth daily.     [employer] VITAMIN C 250 MG tablet  TAKE 1 TABLET BY MOUTH EVERY DAY 100 tablet 1   cyanocobalamin 1000 MCG tablet Take by mouth.     ferrous sulfate 325 (65 FE) MG tablet Take 1 tablet (325 mg total) by mouth 2 (two) times daily with a meal. 180 tablet 1   hydrALAZINE (APRESOLINE) 50 MG tablet Take 50 mg by mouth every 8 (eight) hours.     LANTUS SOLOSTAR 100 UNIT/ML Solostar Pen Inject into the skin.     lisinopril (PRINIVIL,ZESTRIL) 20 MG tablet Take 20 mg by mouth daily.      pravastatin (PRAVACHOL) 10 MG tablet Take 10 mg by mouth at bedtime.      Dulaglutide 0.75 MG/0.5ML SOPN Inject into the skin. Inject 0.5 mLs (0.75 mg total) subcutaneously once a week for 30 days (Patient not taking: Reported on 01/18/2023)     escitalopram (LEXAPRO) 10 MG tablet Take by mouth. (Patient not taking: Reported on 01/18/2023)     insulin detemir (LEVEMIR) 100 UNIT/ML injection Inject 54 Units into the skin at bedtime.  (Patient not taking: Reported on 09/16/2022)     insulin glargine (LANTUS SOLOSTAR) 100 UNIT/ML Solostar Pen Inject into the skin. (Patient not taking: Reported on 01/18/2023)     metroNIDAZOLE (FLAGYL) 500 MG tablet Take 500 mg by mouth 2 (two) times daily. (Patient not taking: Reported on 03/14/2022)     No  current facility-administered medications for this visit.   Facility-Administered Medications Ordered in Other Visits  Medication Dose Route Frequency Provider Last Rate Last Admin   0.9 %  sodium chloride infusion   Intravenous Continuous Rickard Patience, MD   Stopped at 01/18/23 1637   sodium chloride flush (NS) 0.9 % injection 10 mL  10 mL Intravenous Once Rickard Patience, MD         PHYSICAL EXAMINATION: ECOG PERFORMANCE STATUS: 2 - Symptomatic, <50% confined to bed Vitals:   01/18/23 1444  BP: (!) 97/58  Pulse: (!) 57  Resp: 18  Temp: 97.6 F (36.4 C)  SpO2: 97%   There were no vitals filed for this visit.   Physical Exam Constitutional:      General: She is not in acute distress. HENT:     Head: Normocephalic and atraumatic.  Eyes:     Comments: Not able to open her eyes.  Cardiovascular:     Rate and Rhythm: Normal rate and regular rhythm.     Heart sounds: Normal heart sounds.  Pulmonary:     Effort: Pulmonary effort is normal. No respiratory distress.     Breath sounds: No wheezing.  Abdominal:     General: Bowel sounds are normal. There is no distension.  Musculoskeletal:        General: No deformity. Normal range of motion.     Cervical back: Normal range of motion and neck supple.  Skin:    General: Skin is warm and dry.     Findings: No erythema or rash.  Neurological:     Mental Status: She is alert. Mental status is at baseline.     Cranial Nerves: No cranial nerve deficit.     Coordination: Coordination normal.     Comments: Constant body jerks, grimace, movement  Psychiatric:        Mood and Affect: Mood normal.       LABORATORY DATA:  I have reviewed the data as listed    Latest Ref Rng & Units 01/17/2023    1:54 PM 09/12/2022    2:16 PM 03/11/2022  1:32 PM  CBC  WBC 4.0 - 10.5 K/uL 10.4  10.8  10.4   Hemoglobin 12.0 - 15.0 g/dL 60.4  54.0  98.1   Hematocrit 36.0 - 46.0 % 33.0  35.1  38.9   Platelets 150 - 400 K/uL 281  331  321       Latest  Ref Rng & Units 09/06/2021    2:47 PM 02/10/2020    1:31 PM 11/12/2019    4:04 PM  CMP  Glucose 70 - 99 mg/dL 191  87  478   BUN 6 - 20 mg/dL 24  22  32   Creatinine 0.44 - 1.00 mg/dL 2.95  6.21  3.08   Sodium 135 - 145 mmol/L 136  140  138   Potassium 3.5 - 5.1 mmol/L 4.8  4.3  4.6   Chloride 98 - 111 mmol/L 106  112  105   CO2 22 - 32 mmol/L 24  22  28    Calcium 8.9 - 10.3 mg/dL 8.6  8.4  8.9   Total Protein 6.5 - 8.1 g/dL 7.6  7.4  7.6   Total Bilirubin 0.3 - 1.2 mg/dL 0.5  0.6  0.4   Alkaline Phos 38 - 126 U/L 47  46  54   AST 15 - 41 U/L 14  14  14    ALT 0 - 44 U/L 12  13  12       Iron/TIBC/Ferritin/ %Sat    Component Value Date/Time   IRON 26 (L) 01/17/2023 1354   TIBC 227 (L) 01/17/2023 1354   FERRITIN 14 01/17/2023 1354   IRONPCTSAT 12 01/17/2023 1354     RADIOGRAPHIC STUDIES: I have personally reviewed the radiological images as listed and agreed with the findings in the report. No results found.

## 2023-01-18 NOTE — Assessment & Plan Note (Addendum)
Anemia of chronic kidney disease. Labs are reviewed and discussed with patient Lab Results  Component Value Date   HGB 10.2 (L) 01/17/2023   TIBC 227 (L) 01/17/2023   IRONPCTSAT 12 01/17/2023   FERRITIN 14 01/17/2023    Recommend patient to get IV Venofer weekly x 4 to further improve iron store.

## 2023-01-18 NOTE — Patient Instructions (Signed)
Iron Sucrose Injection What is this medication? IRON SUCROSE (EYE ern SOO krose) treats low levels of iron (iron deficiency anemia) in people with kidney disease. Iron is a mineral that plays an important role in making red blood cells, which carry oxygen from your lungs to the rest of your body. This medicine may be used for other purposes; ask your health care provider or pharmacist if you have questions. COMMON BRAND NAME(S): Venofer What should I tell my care team before I take this medication? They need to know if you have any of these conditions: Anemia not caused by low iron levels Heart disease High levels of iron in the blood Kidney disease Liver disease An unusual or allergic reaction to iron, other medications, foods, dyes, or preservatives Pregnant or trying to get pregnant Breastfeeding How should I use this medication? This medication is for infusion into a vein. It is given in a hospital or clinic setting. Talk to your care team about the use of this medication in children. While this medication may be prescribed for children as young as 2 years for selected conditions, precautions do apply. Overdosage: If you think you have taken too much of this medicine contact a poison control center or emergency room at once. NOTE: This medicine is only for you. Do not share this medicine with others. What if I miss a dose? Keep appointments for follow-up doses. It is important not to miss your dose. Call your care team if you are unable to keep an appointment. What may interact with this medication? Do not take this medication with any of the following: Deferoxamine Dimercaprol Other iron products This medication may also interact with the following: Chloramphenicol Deferasirox This list may not describe all possible interactions. Give your health care provider a list of all the medicines, herbs, non-prescription drugs, or dietary supplements you use. Also tell them if you smoke,  drink alcohol, or use illegal drugs. Some items may interact with your medicine. What should I watch for while using this medication? Visit your care team regularly. Tell your care team if your symptoms do not start to get better or if they get worse. You may need blood work done while you are taking this medication. You may need to follow a special diet. Talk to your care team. Foods that contain iron include: whole grains/cereals, dried fruits, beans, or peas, leafy green vegetables, and organ meats (liver, kidney). What side effects may I notice from receiving this medication? Side effects that you should report to your care team as soon as possible: Allergic reactions--skin rash, itching, hives, swelling of the face, lips, tongue, or throat Low blood pressure--dizziness, feeling faint or lightheaded, blurry vision Shortness of breath Side effects that usually do not require medical attention (report to your care team if they continue or are bothersome): Flushing Headache Joint pain Muscle pain Nausea Pain, redness, or irritation at injection site This list may not describe all possible side effects. Call your doctor for medical advice about side effects. You may report side effects to FDA at 1-800-FDA-1088. Where should I keep my medication? This medication is given in a hospital or clinic. It will not be stored at home. NOTE: This sheet is a summary. It may not cover all possible information. If you have questions about this medicine, talk to your doctor, pharmacist, or health care provider.  2024 Elsevier/Gold Standard (2022-09-23 00:00:00)

## 2023-01-25 ENCOUNTER — Inpatient Hospital Stay: Payer: BC Managed Care – PPO

## 2023-01-25 VITALS — BP 133/62 | HR 61 | Temp 99.1°F | Resp 18

## 2023-01-25 DIAGNOSIS — N183 Chronic kidney disease, stage 3 unspecified: Secondary | ICD-10-CM

## 2023-01-25 DIAGNOSIS — I129 Hypertensive chronic kidney disease with stage 1 through stage 4 chronic kidney disease, or unspecified chronic kidney disease: Secondary | ICD-10-CM | POA: Diagnosis not present

## 2023-01-25 DIAGNOSIS — D509 Iron deficiency anemia, unspecified: Secondary | ICD-10-CM

## 2023-01-25 MED ORDER — SODIUM CHLORIDE 0.9 % IV SOLN
INTRAVENOUS | Status: DC
  Filled 2023-01-25: qty 250

## 2023-01-25 MED ORDER — SODIUM CHLORIDE 0.9 % IV SOLN
200.0000 mg | Freq: Once | INTRAVENOUS | Status: AC
  Administered 2023-01-25: 200 mg via INTRAVENOUS
  Filled 2023-01-25: qty 200

## 2023-01-29 ENCOUNTER — Other Ambulatory Visit: Payer: Self-pay | Admitting: Oncology

## 2023-02-01 ENCOUNTER — Other Ambulatory Visit: Payer: Self-pay

## 2023-02-01 ENCOUNTER — Emergency Department
Admission: EM | Admit: 2023-02-01 | Discharge: 2023-02-01 | Disposition: A | Discharge status: Home / Self Care | Payer: BC Managed Care – PPO | Attending: Emergency Medicine | Admitting: Emergency Medicine

## 2023-02-01 ENCOUNTER — Inpatient Hospital Stay: Payer: BC Managed Care – PPO

## 2023-02-01 ENCOUNTER — Emergency Department: Payer: BC Managed Care – PPO

## 2023-02-01 DIAGNOSIS — E119 Type 2 diabetes mellitus without complications: Secondary | ICD-10-CM | POA: Diagnosis not present

## 2023-02-01 DIAGNOSIS — N189 Chronic kidney disease, unspecified: Secondary | ICD-10-CM | POA: Insufficient documentation

## 2023-02-01 DIAGNOSIS — Z20822 Contact with and (suspected) exposure to covid-19: Secondary | ICD-10-CM | POA: Diagnosis not present

## 2023-02-01 DIAGNOSIS — R112 Nausea with vomiting, unspecified: Secondary | ICD-10-CM | POA: Diagnosis present

## 2023-02-01 DIAGNOSIS — I129 Hypertensive chronic kidney disease with stage 1 through stage 4 chronic kidney disease, or unspecified chronic kidney disease: Secondary | ICD-10-CM | POA: Diagnosis not present

## 2023-02-01 DIAGNOSIS — R1010 Upper abdominal pain, unspecified: Secondary | ICD-10-CM | POA: Diagnosis not present

## 2023-02-01 LAB — CBC
HCT: 34.1 % — ABNORMAL LOW (ref 36.0–46.0)
Hemoglobin: 11.1 g/dL — ABNORMAL LOW (ref 12.0–15.0)
MCH: 30.2 pg (ref 26.0–34.0)
MCHC: 32.6 g/dL (ref 30.0–36.0)
MCV: 92.9 fL (ref 80.0–100.0)
Platelets: 369 10*3/uL (ref 150–400)
RBC: 3.67 MIL/uL — ABNORMAL LOW (ref 3.87–5.11)
RDW: 14.1 % (ref 11.5–15.5)
WBC: 19.8 10*3/uL — ABNORMAL HIGH (ref 4.0–10.5)
nRBC: 0 % (ref 0.0–0.2)

## 2023-02-01 LAB — URINALYSIS, ROUTINE W REFLEX MICROSCOPIC
Bilirubin Urine: NEGATIVE
Glucose, UA: NEGATIVE mg/dL
Hgb urine dipstick: NEGATIVE
Ketones, ur: 20 mg/dL — AB
Leukocytes,Ua: NEGATIVE
Nitrite: NEGATIVE
Protein, ur: 100 mg/dL — AB
Specific Gravity, Urine: 1.032 — ABNORMAL HIGH (ref 1.005–1.030)
pH: 5 (ref 5.0–8.0)

## 2023-02-01 LAB — COMPREHENSIVE METABOLIC PANEL
ALT: 13 U/L (ref 0–44)
AST: 25 U/L (ref 15–41)
Albumin: 3.7 g/dL (ref 3.5–5.0)
Alkaline Phosphatase: 47 U/L (ref 38–126)
Anion gap: 13 (ref 5–15)
BUN: 18 mg/dL (ref 6–20)
CO2: 20 mmol/L — ABNORMAL LOW (ref 22–32)
Calcium: 8.9 mg/dL (ref 8.9–10.3)
Chloride: 105 mmol/L (ref 98–111)
Creatinine, Ser: 1.56 mg/dL — ABNORMAL HIGH (ref 0.44–1.00)
GFR, Estimated: 41 mL/min — ABNORMAL LOW (ref >60)
Glucose, Bld: 203 mg/dL — ABNORMAL HIGH (ref 70–99)
Potassium: 4.9 mmol/L (ref 3.5–5.1)
Sodium: 138 mmol/L (ref 135–145)
Total Bilirubin: 1.6 mg/dL — ABNORMAL HIGH (ref 0.3–1.2)
Total Protein: 8.1 g/dL (ref 6.5–8.1)

## 2023-02-01 LAB — SARS CORONAVIRUS 2 BY RT PCR: SARS Coronavirus 2 by RT PCR: NEGATIVE

## 2023-02-01 LAB — LIPASE, BLOOD: Lipase: 30 U/L (ref 11–51)

## 2023-02-01 LAB — HCG, QUANTITATIVE, PREGNANCY: hCG, Beta Chain, Quant, S: <1 m[IU]/mL (ref <5)

## 2023-02-01 LAB — LACTIC ACID, PLASMA: Lactic Acid, Venous: 1.4 mmol/L (ref 0.5–1.9)

## 2023-02-01 MED ORDER — BACLOFEN 10 MG PO TABS
10.0000 mg | ORAL_TABLET | Freq: Three times a day (TID) | ORAL | 0 refills | Status: DC
Start: 2023-02-01 — End: 2023-02-01

## 2023-02-01 MED ORDER — ONDANSETRON 4 MG PO TBDP
4.0000 mg | ORAL_TABLET | Freq: Once | ORAL | Status: AC
  Administered 2023-02-01: 4 mg via ORAL
  Filled 2023-02-01: qty 1

## 2023-02-01 MED ORDER — IOHEXOL 300 MG/ML  SOLN
75.0000 mL | Freq: Once | INTRAMUSCULAR | Status: AC | PRN
  Administered 2023-02-01: 75 mL via INTRAVENOUS

## 2023-02-01 MED ORDER — PREDNISONE 10 MG (21) PO TBPK: ORAL_TABLET | ORAL | 0 refills | Status: DC

## 2023-02-01 MED ORDER — METOCLOPRAMIDE HCL 5 MG/ML IJ SOLN
10.0000 mg | Freq: Once | INTRAMUSCULAR | Status: AC
  Administered 2023-02-01: 10 mg via INTRAVENOUS
  Filled 2023-02-01: qty 2

## 2023-02-01 MED ORDER — METOCLOPRAMIDE HCL 10 MG PO TABS
10.0000 mg | ORAL_TABLET | Freq: Three times a day (TID) | ORAL | 0 refills | Status: AC
Start: 2023-02-01 — End: ?

## 2023-02-01 MED ORDER — SODIUM CHLORIDE 0.9 % IV SOLN: Freq: Once | INTRAVENOUS | Status: AC

## 2023-02-01 NOTE — ED Notes (Signed)
This RN attempted twice for an IV. EMS attempted twice as well. Pt will need US guided IV.

## 2023-02-01 NOTE — ED Notes (Signed)
Pt used bedpan. Missed most of bedpan. Unable to obtain urine for UA. Will attempt again next try.

## 2023-02-01 NOTE — ED Notes (Signed)
This RN and second RN I&O straight cath pt. Urine clear and somewhat cloud.

## 2023-02-01 NOTE — ED Provider Notes (Signed)
[facility] Provider Note    Event Date/Time   First MD Initiated Contact with Patient 02/01/23 1159     (approximate)   History   Emesis   HPI  Vickie Montoya is a 47 y.o. female who is legally blind, has diabetes, hypertension, CKD presents emergency department with nausea vomiting started last night.  Patient states she feels very dehydrated 7 abdominal pain in the upper abdomen.  No diarrhea.  EMS reports that the patient lives in hoarder conditions and there was dog feces on the floor of the entire house.  Patient denies fever, cough or congestion      Physical Exam   Triage Vital Signs: ED Triage Vitals  Encounter Vitals Group     BP 02/01/23 1202 (!) 133/98     Systolic BP Percentile --      Diastolic BP Percentile --      Pulse Rate 02/01/23 1202 65     Resp 02/01/23 1202 16     Temp 02/01/23 1202 98.2 F (36.8 C)     Temp Source 02/01/23 1202 Oral     SpO2 02/01/23 1202 98 %     Weight 02/01/23 1200 225 lb (102.1 kg)     Height 02/01/23 1200 5' 7.75" (1.721 m)     Head Circumference --      Peak Flow --      Pain Score 02/01/23 1200 6     Pain Loc --      Pain Education --      Exclude from Growth Chart --     Most recent vital signs: Vitals:   02/01/23 1202 02/01/23 1645  BP: (!) 133/98 (!) 148/75  Pulse: 65 90  Resp: 16 16  Temp: 98.2 F (36.8 C) 99 F (37.2 C)  SpO2: 98% 96%     General: Awake, no distress.   CV:  Good peripheral perfusion. regular rate and  rhythm Resp:  Normal effort. Lungs cta Abd:  No distention.  Tender in the left upper and right upper quadrant, bowel sounds normal Other:      ED Results / Procedures / Treatments   Labs (all labs ordered are listed, but only abnormal results are displayed) Labs Reviewed  COMPREHENSIVE METABOLIC PANEL - Abnormal; Notable for the following components:      Result Value   CO2 20 (*)    Glucose, Bld 203 (*)    Creatinine, Ser 1.56 (*)    Total Bilirubin  1.6 (*)    GFR, Estimated 41 (*)    All other components within normal limits  CBC - Abnormal; Notable for the following components:   WBC 19.8 (*)    RBC 3.67 (*)    Hemoglobin 11.1 (*)    HCT 34.1 (*)    All other components within normal limits  URINALYSIS, ROUTINE W REFLEX MICROSCOPIC - Abnormal; Notable for the following components:   Color, Urine YELLOW (*)    APPearance HAZY (*)    Specific Gravity, Urine 1.032 (*)    Ketones, ur 20 (*)    Protein, ur 100 (*)    Bacteria, UA FEW (*)    All other components within normal limits  SARS CORONAVIRUS 2 BY RT PCR  CULTURE, BLOOD (ROUTINE X 2)  CULTURE, BLOOD (ROUTINE X 2)  LIPASE, BLOOD  LACTIC ACID, PLASMA  HCG, QUANTITATIVE, PREGNANCY  LACTIC ACID, PLASMA  POC URINE PREG, ED     EKG     RADIOLOGY  CT abdomen pelvis IV contrast    PROCEDURES:   Procedures   MEDICATIONS ORDERED IN ED: Medications  0.9 %  sodium chloride infusion (0 mLs Intravenous Stopped 02/01/23 1531)  metoCLOPramide (REGLAN) injection 10 mg (10 mg Intravenous Given 02/01/23 1412)  ondansetron (ZOFRAN-ODT) disintegrating tablet 4 mg (4 mg Oral Given 02/01/23 1414)  iohexol (OMNIPAQUE) 300 MG/ML solution 75 mL (75 mLs Intravenous Contrast Given 02/01/23 1550)     IMPRESSION / MDM / ASSESSMENT AND PLAN / ED COURSE  I reviewed the triage vital signs and the nursing notes.                              Differential diagnosis includes, but is not limited to, COVID, acute cholecystitis, acute pancreatitis, bowel obstruction, sepsis, pyelonephritis, UTI  Patient's presentation is most consistent with acute presentation with potential threat to life or bodily function.   Nursing staff having difficulty establishing IV access.  Consult to the IV team placed  Labs, imaging ordered, patient was given Zofran via EMS and his vomiting has subsided at this time.  Put the call bell in her hand so that she can let us know if she begins vomiting again  WBC  is grossly elevated at 19.8.  Will consider sepsis.  Lactic acid obtained by nursing staff is reassuring   Patient's glucose elevated at 203 but the patient is diabetic.  Remainder of her labs are reassuring, awaiting UA  EKG shows normal sinus rhythm, see physician read  Patient was given 1 L normal saline, Reglan, Zofran ODT.  Vomiting has subsided at this time.  Patient continues to have some abdominal pain.  CT abdomen pelvis with IV contrast was independently reviewed and interpreted by me as being negative for any acute abnormality per the radiologist report.  Nursing staff instructed to In-N-Out cath for UA  UA is reassuring  Did explain everything to the patient.  Given that her WBC is elevated do not see any signs of sepsis.  She is not tachycardic, lactic is normal.  CT and lab work are reassuring.  Feel that she does not need admission at this time.  However gave her very strict instructions to return the emergency department worsening.  She is in agreement treatment plan.  Discharged stable condition.  FINAL CLINICAL IMPRESSION(S) / ED DIAGNOSES   Final diagnoses:  Nausea and vomiting, unspecified vomiting type     Rx / DC Orders   ED Discharge Orders          Ordered    predniSONE (STERAPRED UNI-PAK 21 TAB) 10 MG (21) TBPK tablet  Status:  Discontinued        02/01/23 1551    baclofen (LIORESAL) 10 MG tablet  3 times daily,   Status:  Discontinued        02/01/23 1551    metoCLOPramide (REGLAN) 10 MG tablet  3 times daily with meals        02/01/23 1719             Note:  This document was prepared using Dragon voice recognition software and may include unintentional dictation errors.    Faythe Ghee, PA-C 02/01/23 1720    Trinna Post, MD 02/02/23 [phone]

## 2023-02-01 NOTE — Discharge Instructions (Addendum)
Use the medication for nausea and vomiting.  Try to drink plenty of fluids and stay on a bland diet for a few days.  Return the emergency department if you are worsening

## 2023-02-01 NOTE — ED Notes (Signed)
Pt husband at bedside to assist pt to get dressed and take to lobby.

## 2023-02-01 NOTE — ED Notes (Signed)
IV team at bedside 

## 2023-02-01 NOTE — ED Triage Notes (Signed)
Pt to ed from home via [facility] for N/V that started yesterday. Pt is legally blind. Lives with husband. Pt received IM zofran from EMS.  Pt is caox4, can answer all questions as asked. Vitals for EMS WNL.   Per EMS pt feet are covered in dog feces. House was completely covered in dog feces and hoarding environment.

## 2023-02-01 NOTE — ED Notes (Signed)
This RN to bedside to answer call bell. Pt states "that machine is making a noise". RN educated pt as it is the cardiac monitor and it will make noise.

## 2023-02-03 ENCOUNTER — Encounter: Payer: Self-pay | Admitting: Oncology

## 2023-02-06 LAB — CULTURE, BLOOD (ROUTINE X 2)
Culture: NO GROWTH
Culture: NO GROWTH
Special Requests: ADEQUATE
Special Requests: ADEQUATE

## 2023-02-08 ENCOUNTER — Inpatient Hospital Stay: Payer: BC Managed Care – PPO

## 2023-02-08 ENCOUNTER — Inpatient Hospital Stay: Payer: BC Managed Care – PPO | Attending: Oncology

## 2023-02-08 VITALS — BP 139/58 | HR 64 | Temp 98.3°F | Resp 19

## 2023-02-08 DIAGNOSIS — Z79899 Other long term (current) drug therapy: Secondary | ICD-10-CM | POA: Diagnosis not present

## 2023-02-08 DIAGNOSIS — D613 Idiopathic aplastic anemia: Secondary | ICD-10-CM | POA: Insufficient documentation

## 2023-02-08 DIAGNOSIS — D631 Anemia in chronic kidney disease: Secondary | ICD-10-CM | POA: Diagnosis present

## 2023-02-08 DIAGNOSIS — D509 Iron deficiency anemia, unspecified: Secondary | ICD-10-CM | POA: Insufficient documentation

## 2023-02-08 DIAGNOSIS — N183 Chronic kidney disease, stage 3 unspecified: Secondary | ICD-10-CM | POA: Insufficient documentation

## 2023-02-08 MED ORDER — SODIUM CHLORIDE 0.9 % IV SOLN
200.0000 mg | Freq: Once | INTRAVENOUS | Status: AC
  Administered 2023-02-08: 200 mg via INTRAVENOUS
  Filled 2023-02-08: qty 200

## 2023-02-08 MED ORDER — SODIUM CHLORIDE 0.9 % IV SOLN
INTRAVENOUS | Status: DC
  Filled 2023-02-08: qty 250

## 2023-02-08 NOTE — Patient Instructions (Signed)
Iron Sucrose Injection What is this medication? IRON SUCROSE (EYE ern SOO krose) treats low levels of iron (iron deficiency anemia) in people with kidney disease. Iron is a mineral that plays an important role in making red blood cells, which carry oxygen from your lungs to the rest of your body. This medicine may be used for other purposes; ask your health care provider or pharmacist if you have questions. COMMON BRAND NAME(S): Venofer What should I tell my care team before I take this medication? They need to know if you have any of these conditions: Anemia not caused by low iron levels Heart disease High levels of iron in the blood Kidney disease Liver disease An unusual or allergic reaction to iron, other medications, foods, dyes, or preservatives Pregnant or trying to get pregnant Breastfeeding How should I use this medication? This medication is for infusion into a vein. It is given in a hospital or clinic setting. Talk to your care team about the use of this medication in children. While this medication may be prescribed for children as young as 2 years for selected conditions, precautions do apply. Overdosage: If you think you have taken too much of this medicine contact a poison control center or emergency room at once. NOTE: This medicine is only for you. Do not share this medicine with others. What if I miss a dose? Keep appointments for follow-up doses. It is important not to miss your dose. Call your care team if you are unable to keep an appointment. What may interact with this medication? Do not take this medication with any of the following: Deferoxamine Dimercaprol Other iron products This medication may also interact with the following: Chloramphenicol Deferasirox This list may not describe all possible interactions. Give your health care provider a list of all the medicines, herbs, non-prescription drugs, or dietary supplements you use. Also tell them if you smoke,  drink alcohol, or use illegal drugs. Some items may interact with your medicine. What should I watch for while using this medication? Visit your care team regularly. Tell your care team if your symptoms do not start to get better or if they get worse. You may need blood work done while you are taking this medication. You may need to follow a special diet. Talk to your care team. Foods that contain iron include: whole grains/cereals, dried fruits, beans, or peas, leafy green vegetables, and organ meats (liver, kidney). What side effects may I notice from receiving this medication? Side effects that you should report to your care team as soon as possible: Allergic reactions--skin rash, itching, hives, swelling of the face, lips, tongue, or throat Low blood pressure--dizziness, feeling faint or lightheaded, blurry vision Shortness of breath Side effects that usually do not require medical attention (report to your care team if they continue or are bothersome): Flushing Headache Joint pain Muscle pain Nausea Pain, redness, or irritation at injection site This list may not describe all possible side effects. Call your doctor for medical advice about side effects. You may report side effects to FDA at 1-800-FDA-1088. Where should I keep my medication? This medication is given in a hospital or clinic. It will not be stored at home. NOTE: This sheet is a summary. It may not cover all possible information. If you have questions about this medicine, talk to your doctor, pharmacist, or health care provider.  2024 Elsevier/Gold Standard (2022-09-23 00:00:00)

## 2023-02-15 ENCOUNTER — Inpatient Hospital Stay: Payer: BC Managed Care – PPO

## 2023-02-22 ENCOUNTER — Inpatient Hospital Stay: Payer: BC Managed Care – PPO

## 2023-02-22 VITALS — BP 124/76 | HR 99 | Temp 97.9°F

## 2023-02-22 DIAGNOSIS — D509 Iron deficiency anemia, unspecified: Secondary | ICD-10-CM

## 2023-02-22 DIAGNOSIS — N183 Chronic kidney disease, stage 3 unspecified: Secondary | ICD-10-CM | POA: Diagnosis not present

## 2023-02-22 MED ORDER — SODIUM CHLORIDE 0.9% FLUSH
10.0000 mL | Freq: Once | INTRAVENOUS | Status: AC | PRN
  Administered 2023-02-22 (×2): 10 mL
  Filled 2023-02-22: qty 10

## 2023-02-22 MED ORDER — IRON SUCROSE 20 MG/ML IV SOLN
200.0000 mg | Freq: Once | INTRAVENOUS | Status: AC
Start: 2023-02-22 — End: 2023-02-22
  Administered 2023-02-22: 200 mg via INTRAVENOUS
  Filled 2023-02-22: qty 10

## 2023-02-27 ENCOUNTER — Other Ambulatory Visit: Payer: Self-pay | Admitting: Oncology

## 2023-02-28 ENCOUNTER — Encounter: Payer: Self-pay | Admitting: Oncology

## 2023-03-13 ENCOUNTER — Other Ambulatory Visit: Payer: Self-pay

## 2023-03-13 ENCOUNTER — Emergency Department: Payer: BC Managed Care – PPO

## 2023-03-13 ENCOUNTER — Emergency Department
Admission: EM | Admit: 2023-03-13 | Discharge: 2023-03-13 | Disposition: A | Discharge status: Home / Self Care | Payer: BC Managed Care – PPO | Attending: Emergency Medicine | Admitting: Emergency Medicine

## 2023-03-13 DIAGNOSIS — E1122 Type 2 diabetes mellitus with diabetic chronic kidney disease: Secondary | ICD-10-CM | POA: Diagnosis not present

## 2023-03-13 DIAGNOSIS — E1142 Type 2 diabetes mellitus with diabetic polyneuropathy: Secondary | ICD-10-CM | POA: Diagnosis not present

## 2023-03-13 DIAGNOSIS — M25511 Pain in right shoulder: Secondary | ICD-10-CM | POA: Insufficient documentation

## 2023-03-13 DIAGNOSIS — N183 Chronic kidney disease, stage 3 unspecified: Secondary | ICD-10-CM | POA: Insufficient documentation

## 2023-03-13 DIAGNOSIS — I129 Hypertensive chronic kidney disease with stage 1 through stage 4 chronic kidney disease, or unspecified chronic kidney disease: Secondary | ICD-10-CM | POA: Insufficient documentation

## 2023-03-13 MED ORDER — LIDOCAINE 5 % EX PTCH: 1.0000 | MEDICATED_PATCH | Freq: Two times a day (BID) | CUTANEOUS | 0 refills | Status: AC

## 2023-03-13 MED ORDER — LIDOCAINE 5 % EX PTCH
1.0000 | MEDICATED_PATCH | CUTANEOUS | Status: DC
  Administered 2023-03-13: 1 via TRANSDERMAL
  Filled 2023-03-13: qty 1

## 2023-03-13 MED ORDER — ACETAMINOPHEN 325 MG PO TABS
650.0000 mg | ORAL_TABLET | Freq: Once | ORAL | Status: AC
  Administered 2023-03-13: 650 mg via ORAL
  Filled 2023-03-13: qty 2

## 2023-03-13 NOTE — ED Provider Notes (Signed)
[facility] Provider Note    Event Date/Time   First MD Initiated Contact with Patient 03/13/23 [phone]     (approximate)   History   Shoulder Injury   HPI  Vickie Montoya is a 47 y.o. female with a past medical history of diabetes, hyperlipidemia, morbid obesity, craniocervical dystonia with upper limb involvement, who presents today for evaluation of right shoulder pain.  Patient reports that this has been ongoing for 3 weeks.  She denies specific injury.  She reports that her pain is worsened with overhead movements but she is still able to do these movements.  She has not had any numbness or tingling.  No fevers or chills.  No weakness.  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, associated with type 2 diabetes mellitus (HCC) 10/08/2018   Vitreous hemorrhage, left eye (HCC) 10/08/2018   Diabetes mellitus due to underlying condition with proliferative diabetic retinopathy without macular edema, left eye (HCC) 10/08/2018   Diabetic polyneuropathy associated with type 2 diabetes mellitus (HCC) 08/10/2018   Hyperlipidemia associated with type 2 diabetes mellitus (HCC) 08/10/2018   Kidney disease, chronic, stage III (GFR 30-59 ml/min) (HCC) 08/10/2018   Obesity, morbid, BMI 40.0-49.9 (HCC) 08/10/2018   Uncontrolled type 2 diabetes mellitus with chronic kidney disease 08/06/2018   Hyperlipidemia, mixed 07/30/2018   Acute left-sided thoracic back pain 05/28/2018   Chest discomfort 05/27/2018   Shortness of breath 05/27/2018   Anemia associated with stage 3 chronic renal failure (HCC) 06/06/2017   Decreased visual acuity 06/06/2017   Persistent cough for 3 weeks or longer  06/06/2017   Acute kidney failure, unspecified (HCC) 02/16/2017   Altered mental status 02/16/2017   Blindness 02/16/2017   Anemia 02/16/2017   Vitreous hemorrhage (HCC) 02/16/2017   Chronic tension-type headache, intractable 02/08/2017   Elevated LDL cholesterol level 09/19/2016   Microalbuminuria 09/19/2016   Depression 11/27/2015   Essential hypertension 11/26/2015   Cellulitis and abscess 07/13/2015   Abscess of lower back 07/02/2015   Type 2 diabetes mellitus (HCC) 10/14/2011   Hypertension, benign 10/14/2011          Physical Exam   Triage Vital Signs: ED Triage Vitals  Encounter Vitals Group     BP 03/13/23 0537 115/80     Systolic BP Percentile --      Diastolic BP Percentile --      Pulse Rate 03/13/23 0537 64     Resp 03/13/23 0537 20     Temp 03/13/23 0537 98.8 F (37.1 C)     Temp Source 03/13/23 0537 Oral     SpO2 03/13/23 0537 98 %     Weight 03/13/23 0539 224 lb 13.9 oz (102 kg)     Height 03/13/23 0539 5' 7.5" (1.715 m)     Head Circumference --      Peak Flow --      Pain Score 03/13/23 0536 10     Pain Loc --      Pain Education --      Exclude from Growth Chart --     Most recent vital signs: Vitals:   03/13/23 0537  BP: 115/80  Pulse: 64  Resp: 20  Temp: 98.8 F (37.1 C)  SpO2: 98%  Physical Exam Vitals and nursing note reviewed.  Constitutional:      General: Awake and alert. No acute distress.    Appearance: Normal appearance. The patient is obese.  HENT:     Head: Normocephalic and atraumatic.     Mouth: Mucous membranes are moist.  Eyes:     General: PERRL. Normal EOMs        Right eye: No discharge.        Left eye: No discharge.     Conjunctiva/sclera: Conjunctivae normal.  Cardiovascular:     Rate and Rhythm: Normal rate and regular rhythm.     Pulses: Normal pulses.  Pulmonary:     Effort: Pulmonary effort is normal. No respiratory distress.     Breath sounds: Normal breath sounds.  Abdominal:     Abdomen is  soft. There is no abdominal tenderness. No rebound or guarding. No distention. Musculoskeletal:        General: No swelling. Normal range of motion.     Cervical back: Normal range of motion and neck supple. No midline cervical spine tenderness.  Full range of motion of neck.  Negative Spurling test.  Negative Lhermitte sign.  Normal strength and sensation in bilateral upper extremities. Normal grip strength bilaterally.  Normal intrinsic muscle function of the hand bilaterally.  Normal radial pulses bilaterally. Right shoulder: No obvious deformity, swelling, ecchymosis, or erythema No clavicular or AC joint tenderness, anterior and posterior shoulder joint line tenderness. Able to actively and passively forward flex and abduct at shoulder fully, negative drop arm test Normal ROM at elbow and wrist Normal resisted pronation and supination 2+ radial pulse Normal grip strength Normal intrinsic hand muscle function Skin:    General: Skin is warm and dry.     Capillary Refill: Capillary refill takes less than 2 seconds.     Findings: No rash.  Neurological:     Mental Status: The patient is awake and alert.  Has dystonic movements which appears to be at her baseline.     ED Results / Procedures / Treatments   Labs (all labs ordered are listed, but only abnormal results are displayed) Labs Reviewed - No data to display   EKG     RADIOLOGY I independently reviewed and interpreted imaging and agree with radiologists findings.     PROCEDURES:  Critical Care performed:   Procedures   MEDICATIONS ORDERED IN ED: Medications  lidocaine (LIDODERM) 5 % 1 patch (1 patch Transdermal Patch Applied 03/13/23 0743)  acetaminophen (TYLENOL) tablet 650 mg (650 mg Oral Given 03/13/23 0743)     IMPRESSION / MDM / ASSESSMENT AND PLAN / ED COURSE  I reviewed the triage vital signs and the nursing notes.   Differential diagnosis includes, but is not limited to, rotator cuff injury,  labral injury, bursitis, less likely fracture or dislocation.  Patient is awake and alert, hemodynamically stable and neurovascularly intact.  She has full and normal range of motion of her shoulder, though pain with overhead movements.  Symptoms most consistent with rotator cuff injury.  X-ray of her shoulder reveals degenerative changes with osteoarthritis and high riding humeral head suggestive of rotator cuff disease.  I recommended close outpatient follow-up with orthopedics and the appropriate follow-up information was provided.  She was treated symptomatically with improvement of her symptoms.  She was not given NSAIDs given her recent creatinine of 1.5 per chart review.  We discussed return precautions and the importance of close outpatient follow-up.  Patient understands and agrees with  plan.  She was discharged with her family member.   Patient's presentation is most consistent with acute complicated illness / injury requiring diagnostic workup.      FINAL CLINICAL IMPRESSION(S) / ED DIAGNOSES   Final diagnoses:  Acute pain of right shoulder     Rx / DC Orders   ED Discharge Orders          Ordered    lidocaine (LIDODERM) 5 %  Every 12 hours        03/13/23 0730             Note:  This document was prepared using Dragon voice recognition software and may include unintentional dictation errors.   Jackelyn Hoehn, PA-C 03/13/23 1610    Chesley Noon, MD 03/13/23 1515

## 2023-03-13 NOTE — Discharge Instructions (Signed)
Please follow-up with orthopedics.  Please return for any new, worsening, or change in symptoms or other concerns.  It was a pleasure caring for you today.

## 2023-03-13 NOTE — ED Triage Notes (Signed)
Pt arrived POV for right shoulder pain for 3 weeks, denies injury and or fall. Pt reports had her schedule Botox injection 3 weeks ago as well to right side of her neck and thinks that may have contributed to her shoulder pain for the past weeks. ROM noted, radial pulse present, CNS intact. VSS, Pt is at her baseline.

## 2023-03-13 NOTE — ED Notes (Signed)
See triage note  Presents with right shoulder pain  States pain started about 3 weeks ago  denies any injury  No deformity noted  Good pulses and ROM

## 2023-03-21 ENCOUNTER — Other Ambulatory Visit: Payer: Self-pay | Admitting: Oncology

## 2023-03-22 ENCOUNTER — Encounter: Payer: Self-pay | Admitting: Oncology

## 2023-04-03 ENCOUNTER — Other Ambulatory Visit: Payer: BC Managed Care – PPO

## 2023-04-03 ENCOUNTER — Inpatient Hospital Stay: Payer: BC Managed Care – PPO | Attending: Oncology

## 2023-04-03 DIAGNOSIS — N183 Chronic kidney disease, stage 3 unspecified: Secondary | ICD-10-CM | POA: Insufficient documentation

## 2023-04-03 DIAGNOSIS — D631 Anemia in chronic kidney disease: Secondary | ICD-10-CM | POA: Insufficient documentation

## 2023-04-03 DIAGNOSIS — E538 Deficiency of other specified B group vitamins: Secondary | ICD-10-CM | POA: Insufficient documentation

## 2023-04-03 DIAGNOSIS — Z79899 Other long term (current) drug therapy: Secondary | ICD-10-CM | POA: Diagnosis not present

## 2023-04-03 LAB — IRON AND TIBC
Iron: 50 ug/dL (ref 28–170)
Saturation Ratios: 19 % (ref 10.4–31.8)
TIBC: 258 ug/dL (ref 250–450)
UIBC: 208 ug/dL

## 2023-04-03 LAB — CBC WITH DIFFERENTIAL (CANCER CENTER ONLY)
Abs Immature Granulocytes: 0.04 10*3/uL (ref 0.00–0.07)
Basophils Absolute: 0.1 10*3/uL (ref 0.0–0.1)
Basophils Relative: 1 %
Eosinophils Absolute: 0.1 10*3/uL (ref 0.0–0.5)
Eosinophils Relative: 1 %
HCT: 33.4 % — ABNORMAL LOW (ref 36.0–46.0)
Hemoglobin: 10.5 g/dL — ABNORMAL LOW (ref 12.0–15.0)
Immature Granulocytes: 0 %
Lymphocytes Relative: 16 %
Lymphs Abs: 1.7 10*3/uL (ref 0.7–4.0)
MCH: 31.1 pg (ref 26.0–34.0)
MCHC: 31.4 g/dL (ref 30.0–36.0)
MCV: 98.8 fL (ref 80.0–100.0)
Monocytes Absolute: 0.7 10*3/uL (ref 0.1–1.0)
Monocytes Relative: 7 %
Neutro Abs: 8.1 10*3/uL — ABNORMAL HIGH (ref 1.7–7.7)
Neutrophils Relative %: 75 %
Platelet Count: 313 10*3/uL (ref 150–400)
RBC: 3.38 MIL/uL — ABNORMAL LOW (ref 3.87–5.11)
RDW: 14.4 % (ref 11.5–15.5)
WBC Count: 10.8 10*3/uL — ABNORMAL HIGH (ref 4.0–10.5)
nRBC: 0 % (ref 0.0–0.2)

## 2023-04-03 LAB — FERRITIN: Ferritin: 36 ng/mL (ref 11–307)

## 2023-04-03 LAB — RETIC PANEL
Immature Retic Fract: 8.1 % (ref 2.3–15.9)
RBC.: 3.31 MIL/uL — ABNORMAL LOW (ref 3.87–5.11)
Retic Count, Absolute: 42 10*3/uL (ref 19.0–186.0)
Retic Ct Pct: 1.3 % (ref 0.4–3.1)
Reticulocyte Hemoglobin: 33 pg (ref >27.9)

## 2023-04-03 LAB — VITAMIN B12: Vitamin B-12: 152 pg/mL — ABNORMAL LOW (ref 180–914)

## 2023-04-03 LAB — FOLATE: Folate: 6.8 ng/mL (ref >5.9)

## 2023-04-05 ENCOUNTER — Ambulatory Visit: Payer: BC Managed Care – PPO | Admitting: Oncology

## 2023-04-05 ENCOUNTER — Ambulatory Visit: Payer: BC Managed Care – PPO

## 2023-04-05 ENCOUNTER — Inpatient Hospital Stay: Payer: BC Managed Care – PPO | Admitting: Oncology

## 2023-04-05 ENCOUNTER — Inpatient Hospital Stay: Payer: BC Managed Care – PPO

## 2023-04-07 ENCOUNTER — Telehealth: Payer: Self-pay

## 2023-04-07 DIAGNOSIS — D509 Iron deficiency anemia, unspecified: Secondary | ICD-10-CM

## 2023-04-07 NOTE — Telephone Encounter (Signed)
-----   Message from Rickard Patience sent at 04/06/2023 11:02 PM EST ----- Please let her know that her B12 level is low.  Recommend patient to take B12 daily. OTC supply.  Please add blood work prior to her MD appt on 05/15/23 + Venofer   check cbc retic panel, B12. Thanks.

## 2023-04-07 NOTE — Telephone Encounter (Signed)
Called and spoke to pt's husband [name]. Informed him of MD recommendation.   Please schedule lab (cbc, retic panel, B12) on 1/10 @ 2:30p. [name] aware.

## 2023-05-11 ENCOUNTER — Other Ambulatory Visit: Payer: Self-pay

## 2023-05-11 ENCOUNTER — Telehealth: Payer: Self-pay | Admitting: Oncology

## 2023-05-11 DIAGNOSIS — D509 Iron deficiency anemia, unspecified: Secondary | ICD-10-CM

## 2023-05-11 NOTE — Telephone Encounter (Signed)
 Called patient to change lab appointment due to weather. Per chat Dr. Cathie Hoops, if husband can not bring in tomorrow or Monday early am for labs, she will need to be rescheduled to a different day.   No answer no voicemail. BC

## 2023-05-12 ENCOUNTER — Inpatient Hospital Stay: Payer: BC Managed Care – PPO

## 2023-05-15 ENCOUNTER — Inpatient Hospital Stay: Payer: BC Managed Care – PPO | Attending: Oncology

## 2023-05-15 ENCOUNTER — Inpatient Hospital Stay: Payer: BC Managed Care – PPO

## 2023-05-15 ENCOUNTER — Inpatient Hospital Stay: Payer: BC Managed Care – PPO | Admitting: Oncology

## 2023-05-15 DIAGNOSIS — M2550 Pain in unspecified joint: Secondary | ICD-10-CM | POA: Diagnosis not present

## 2023-05-15 DIAGNOSIS — Z803 Family history of malignant neoplasm of breast: Secondary | ICD-10-CM | POA: Insufficient documentation

## 2023-05-15 DIAGNOSIS — Z79899 Other long term (current) drug therapy: Secondary | ICD-10-CM | POA: Insufficient documentation

## 2023-05-15 DIAGNOSIS — M25519 Pain in unspecified shoulder: Secondary | ICD-10-CM | POA: Diagnosis not present

## 2023-05-15 DIAGNOSIS — F32A Depression, unspecified: Secondary | ICD-10-CM | POA: Insufficient documentation

## 2023-05-15 DIAGNOSIS — D509 Iron deficiency anemia, unspecified: Secondary | ICD-10-CM

## 2023-05-15 DIAGNOSIS — Z833 Family history of diabetes mellitus: Secondary | ICD-10-CM | POA: Insufficient documentation

## 2023-05-15 DIAGNOSIS — R5383 Other fatigue: Secondary | ICD-10-CM | POA: Diagnosis not present

## 2023-05-15 DIAGNOSIS — F1290 Cannabis use, unspecified, uncomplicated: Secondary | ICD-10-CM | POA: Insufficient documentation

## 2023-05-15 DIAGNOSIS — D631 Anemia in chronic kidney disease: Secondary | ICD-10-CM | POA: Insufficient documentation

## 2023-05-15 DIAGNOSIS — E538 Deficiency of other specified B group vitamins: Secondary | ICD-10-CM | POA: Insufficient documentation

## 2023-05-15 DIAGNOSIS — Z83438 Family history of other disorder of lipoprotein metabolism and other lipidemia: Secondary | ICD-10-CM | POA: Diagnosis not present

## 2023-05-15 DIAGNOSIS — Z801 Family history of malignant neoplasm of trachea, bronchus and lung: Secondary | ICD-10-CM | POA: Insufficient documentation

## 2023-05-15 DIAGNOSIS — N183 Chronic kidney disease, stage 3 unspecified: Secondary | ICD-10-CM | POA: Insufficient documentation

## 2023-05-15 LAB — IRON AND TIBC
Iron: 27 ug/dL — ABNORMAL LOW (ref 28–170)
Saturation Ratios: 10 % — ABNORMAL LOW (ref 10.4–31.8)
TIBC: 280 ug/dL (ref 250–450)
UIBC: 253 ug/dL

## 2023-05-15 LAB — CBC WITH DIFFERENTIAL (CANCER CENTER ONLY)
Abs Immature Granulocytes: 0.03 10*3/uL (ref 0.00–0.07)
Basophils Absolute: 0.1 10*3/uL (ref 0.0–0.1)
Basophils Relative: 1 %
Eosinophils Absolute: 0.3 10*3/uL (ref 0.0–0.5)
Eosinophils Relative: 3 %
HCT: 30.1 % — ABNORMAL LOW (ref 36.0–46.0)
Hemoglobin: 9.3 g/dL — ABNORMAL LOW (ref 12.0–15.0)
Immature Granulocytes: 0 %
Lymphocytes Relative: 18 %
Lymphs Abs: 1.7 10*3/uL (ref 0.7–4.0)
MCH: 30.8 pg (ref 26.0–34.0)
MCHC: 30.9 g/dL (ref 30.0–36.0)
MCV: 99.7 fL (ref 80.0–100.0)
Monocytes Absolute: 0.8 10*3/uL (ref 0.1–1.0)
Monocytes Relative: 9 %
Neutro Abs: 6.2 10*3/uL (ref 1.7–7.7)
Neutrophils Relative %: 69 %
Platelet Count: 383 10*3/uL (ref 150–400)
RBC: 3.02 MIL/uL — ABNORMAL LOW (ref 3.87–5.11)
RDW: 14.1 % (ref 11.5–15.5)
WBC Count: 9.1 10*3/uL (ref 4.0–10.5)
nRBC: 0 % (ref 0.0–0.2)

## 2023-05-15 LAB — RETIC PANEL
Immature Retic Fract: 15.6 % (ref 2.3–15.9)
RBC.: 2.98 MIL/uL — ABNORMAL LOW (ref 3.87–5.11)
Retic Count, Absolute: 101 10*3/uL (ref 19.0–186.0)
Retic Ct Pct: 3.4 % — ABNORMAL HIGH (ref 0.4–3.1)
Reticulocyte Hemoglobin: 32.6 pg (ref >27.9)

## 2023-05-15 LAB — VITAMIN B12: Vitamin B-12: 100 pg/mL — ABNORMAL LOW (ref 180–914)

## 2023-05-15 LAB — FOLATE: Folate: 7.4 ng/mL (ref >5.9)

## 2023-05-15 LAB — FERRITIN: Ferritin: 16 ng/mL (ref 11–307)

## 2023-05-18 ENCOUNTER — Inpatient Hospital Stay (HOSPITAL_BASED_OUTPATIENT_CLINIC_OR_DEPARTMENT_OTHER): Payer: BC Managed Care – PPO | Admitting: Oncology

## 2023-05-18 ENCOUNTER — Inpatient Hospital Stay: Payer: BC Managed Care – PPO

## 2023-05-18 ENCOUNTER — Encounter: Payer: Self-pay | Admitting: Oncology

## 2023-05-18 VITALS — BP 118/71 | HR 70

## 2023-05-18 VITALS — BP 115/52 | HR 69 | Temp 97.6°F | Resp 20

## 2023-05-18 DIAGNOSIS — E538 Deficiency of other specified B group vitamins: Secondary | ICD-10-CM | POA: Diagnosis not present

## 2023-05-18 DIAGNOSIS — D631 Anemia in chronic kidney disease: Secondary | ICD-10-CM

## 2023-05-18 DIAGNOSIS — N183 Chronic kidney disease, stage 3 unspecified: Secondary | ICD-10-CM

## 2023-05-18 DIAGNOSIS — D509 Iron deficiency anemia, unspecified: Secondary | ICD-10-CM

## 2023-05-18 MED ORDER — IRON SUCROSE 20 MG/ML IV SOLN
200.0000 mg | Freq: Once | INTRAVENOUS | Status: AC
  Administered 2023-05-18: 200 mg via INTRAVENOUS

## 2023-05-18 MED ORDER — CYANOCOBALAMIN 1000 MCG/ML IJ SOLN
1000.0000 ug | Freq: Once | INTRAMUSCULAR | Status: AC
  Administered 2023-05-18: 1000 ug via INTRAMUSCULAR
  Filled 2023-05-18: qty 1

## 2023-05-18 MED ORDER — SODIUM CHLORIDE 0.9% FLUSH
10.0000 mL | Freq: Once | INTRAVENOUS | Status: AC | PRN
  Administered 2023-05-18: 10 mL
  Filled 2023-05-18: qty 10

## 2023-05-18 NOTE — Assessment & Plan Note (Addendum)
Anemia of chronic kidney disease. Labs are reviewed and discussed with patient Lab Results  Component Value Date   HGB 9.3 (L) 05/15/2023   TIBC 280 05/15/2023   IRONPCTSAT 10 (L) 05/15/2023   FERRITIN 16 05/15/2023    Recommend patient to get IV Venofer weekly x 4 to further improve iron store.

## 2023-05-18 NOTE — Assessment & Plan Note (Signed)
Recommend Vitamin B12 IM supplementation, weekly x 4 followed by monthly.

## 2023-05-18 NOTE — Progress Notes (Signed)
Patients right shoulder pain is at a 10, ongoing for a while now, she is taking tylenol for the pain which isn't helping much at all.

## 2023-05-18 NOTE — Progress Notes (Signed)
Hematology/Oncology Progress note [phone] [phone]      Patient Care Team: Ardyth Man, PA-C as PCP - General (Family Medicine) Rickard Patience, MD as Consulting Physician (Hematology and Oncology)   ASSESSMENT & PLAN:   Anemia associated with stage 3 chronic renal failure (HCC) Anemia of chronic kidney disease. Labs are reviewed and discussed with patient Lab Results  Component Value Date   HGB 9.3 (L) 05/15/2023   TIBC 280 05/15/2023   IRONPCTSAT 10 (L) 05/15/2023   FERRITIN 16 05/15/2023    Recommend patient to get IV Venofer weekly x 4 to further improve iron store.  B12 deficiency Recommend Vitamin B12 IM supplementation, weekly x 4 followed by monthly.   Orders Placed This Encounter  Procedures   CBC with Differential (Cancer Center Only)    Standing Status:   Future    Expected Date:   08/16/2023    Expiration Date:   05/17/2024   Ferritin    Standing Status:   Future    Expected Date:   08/16/2023    Expiration Date:   05/17/2024   Iron and TIBC    Standing Status:   Future    Expected Date:   08/16/2023    Expiration Date:   05/17/2024   Retic Panel    Standing Status:   Future    Expected Date:   08/16/2023    Expiration Date:   05/17/2024   Vitamin B12    Standing Status:   Future    Expected Date:   08/16/2023    Expiration Date:   05/17/2024   Anti-parietal antibody    Standing Status:   Future    Expected Date:   08/16/2023    Expiration Date:   05/17/2024   Intrinsic Factor Antibodies    Standing Status:   Future    Expected Date:   08/16/2023    Expiration Date:   05/17/2024   Follow up in 3 months.  All questions were answered. The patient knows to call the clinic with any problems, questions or concerns.  Rickard Patience, MD, PhD [facility] Hematology Oncology 05/18/2023   CHIEF COMPLAINTS/REASON FOR VISIT:  iron deficiency anemia  HISTORY OF PRESENTING ILLNESS:  Vickie Montoya is a  48 y.o.  female with PMH listed below  was seen in consultation at the request of Ardyth Man, [place]  for evaluation of iron deficiency anemia.   Reviewed patient's recent labs  10/24/2019, labs revealed anemia with hemoglobin of 10.3, WBC 13.5, platelet count 481, differential showed neutrophilia..  Ferritin 10. Reviewed patient's previous labs ordered by primary care physician's office, anemia is chronic onset , duration is since at least 2009.  Chronic decreased iron saturation and ferritin. No aggravating or improving factors. Patient follows up with neurology Dr. Malvin Johns for facial spasms, body jerking and tremors. Associated signs and symptoms: Patient reports fatigue.  Denies SOB with exertion.  Denies weight loss, easy bruising, hematochezia, hemoptysis, hematuria. Context:  History of iron deficiency: Patient has been on oral iron supplementation Rectal bleeding: Denies Menstrual bleeding/ Vaginal bleeding : Patient reports normal flow, usually last 3 days. hematemesis or hemoptysis : denies Blood in urine : denies   INTERVAL HISTORY Vickie Montoya is a 48 y.o. female who has above history reviewed by me today presents for follow up visit for management of anemia.  Patient reports feeling well.  She was accompanied by her husband. No new complaints.  + fatigue + severe shoulder pain. Husband plans to  take her to PCP for evaluation.   Review of Systems  Constitutional:  Positive for fatigue.  Respiratory:  Negative for cough and shortness of breath.   Cardiovascular:  Negative for chest pain.  Gastrointestinal:  Negative for abdominal pain.  Genitourinary:  Negative for dysuria.   Musculoskeletal:  Positive for arthralgias. Negative for back pain.  Neurological:        Body jerks patient spasms and tremors.  Psychiatric/Behavioral:  Negative for confusion.     MEDICAL HISTORY:  Past Medical History:  Diagnosis Date   Blind    Chronic kidney disease (CKD), stage III (moderate) (HCC)    Depression     Diabetes mellitus without complication (HCC)    Hypertension    Migraine     SURGICAL HISTORY: Past Surgical History:  Procedure Laterality Date   CESAREAN SECTION     COLONOSCOPY WITH PROPOFOL N/A 05/23/2022   Procedure: COLONOSCOPY WITH PROPOFOL;  Surgeon: Regis Bill, MD;  Location: [facility] ENDOSCOPY;  Service: Endoscopy;  Laterality: N/A;  legally blind   ESOPHAGOGASTRODUODENOSCOPY (EGD) WITH PROPOFOL N/A 05/23/2022   Procedure: ESOPHAGOGASTRODUODENOSCOPY (EGD) WITH PROPOFOL;  Surgeon: Regis Bill, MD;  Location: [facility] ENDOSCOPY;  Service: Endoscopy;  Laterality: N/A;   FRACTURE SURGERY     ORIF TOE FRACTURE Left 01/18/2019   Procedure: OPEN REDUCTION INTERNAL FIXATION (ORIF) METATARSAL (TOE) FRACTURE;  Surgeon: Gwyneth Revels, DPM;  Location: [facility] ORS;  Service: Podiatry;  Laterality: Left;   TUBAL LIGATION     tubiligation      SOCIAL HISTORY: Social History   Socioeconomic History   Marital status: Married    Spouse name: Not on file   Number of children: Not on file   Years of education: Not on file   Highest education level: Not on file  Occupational History   Occupation: disabled   Tobacco Use   Smoking status: Never   Smokeless tobacco: Never  Vaping Use   Vaping status: Never Used  Substance and Sexual Activity   Alcohol use: Yes    Comment: occasional    Drug use: Yes    Types: Marijuana    Comment: occasional this am 05/23/2022   Sexual activity: Not on file  Other Topics Concern   Not on file  Social History Narrative   Not on file   Social Drivers of Health   Financial Resource Strain: Low Risk  (05/04/2022)   Received from [facility], [facility]   Overall Financial Resource Strain (CARDIA)    Difficulty of Paying Living Expenses: Not hard at all  Food Insecurity: Food Insecurity Present (05/04/2022)   Received from [facility], [facility]   Hunger Vital Sign     Worried About Running Out of Food in the Last Year: Sometimes true    Ran Out of Food in the Last Year: Sometimes true  Transportation Needs: No Transportation Needs (05/04/2022)   Received from [facility], [facility]   [facility] - Transportation    In the past 12 months, has lack of transportation kept you from medical appointments or from getting medications?: No    Lack of Transportation (Non-Medical): No  Physical Activity: Not on file  Stress: Not on file  Social Connections: Not on file  Intimate Partner Violence: Not on file    FAMILY HISTORY: Family History  Problem Relation Age of Onset   Hyperlipidemia Mother    Diabetes Father    Breast  cancer Maternal Aunt    Lung cancer Maternal Uncle     ALLERGIES:  is allergic to sitagliptin.  MEDICATIONS:  Current Outpatient Medications  Medication Sig Dispense Refill   amLODipine (NORVASC) 10 MG tablet Take 10 mg by mouth daily.     aspirin EC 81 MG tablet Take 81 mg by mouth at bedtime.      carvedilol (COREG) 12.5 MG tablet Take 25 mg by mouth 2 (two) times daily.     cetirizine (ZYRTEC) 10 MG tablet Take 10 mg by mouth daily.     [employer] VITAMIN C 250 MG tablet TAKE 1 TABLET BY MOUTH EVERY DAY 100 tablet 1   cyanocobalamin 1000 MCG tablet Take by mouth.     Dulaglutide 0.75 MG/0.5ML SOPN Inject into the skin. Inject 0.5 mLs (0.75 mg total) subcutaneously once a week for 30 days (Patient not taking: Reported on 01/18/2023)     escitalopram (LEXAPRO) 10 MG tablet Take by mouth. (Patient not taking: Reported on 01/18/2023)     ferrous sulfate 325 (65 FE) MG tablet TAKE 1 TABLET BY MOUTH 2 TIMES DAILY WITH A MEAL. 180 tablet 1   hydrALAZINE (APRESOLINE) 50 MG tablet Take 50 mg by mouth every 8 (eight) hours.     insulin detemir (LEVEMIR) 100 UNIT/ML injection Inject 54 Units into the skin at bedtime.  (Patient not taking: Reported on 09/16/2022)     insulin glargine (LANTUS SOLOSTAR) 100 UNIT/ML  Solostar Pen Inject into the skin. (Patient not taking: Reported on 01/18/2023)     LANTUS SOLOSTAR 100 UNIT/ML Solostar Pen Inject into the skin.     lisinopril (PRINIVIL,ZESTRIL) 20 MG tablet Take 20 mg by mouth daily.      metoCLOPramide (REGLAN) 10 MG tablet Take 1 tablet (10 mg total) by mouth 3 (three) times daily with meals. 30 tablet 0   metroNIDAZOLE (FLAGYL) 500 MG tablet Take 500 mg by mouth 2 (two) times daily. (Patient not taking: Reported on 03/14/2022)     pravastatin (PRAVACHOL) 10 MG tablet Take 10 mg by mouth at bedtime.      No current facility-administered medications for this visit.     PHYSICAL EXAMINATION: ECOG PERFORMANCE STATUS: 2 - Symptomatic, <50% confined to bed Vitals:   05/18/23 1428  BP: (!) 115/52  Pulse: 69  Resp: 20  Temp: 97.6 F (36.4 C)  SpO2: 95%   There were no vitals filed for this visit.   Physical Exam Constitutional:      General: She is not in acute distress. HENT:     Head: Normocephalic and atraumatic.  Eyes:     Comments: Not able to open her eyes.  Cardiovascular:     Rate and Rhythm: Normal rate and regular rhythm.  Pulmonary:     Effort: Pulmonary effort is normal. No respiratory distress.     Breath sounds: No wheezing.  Abdominal:     General: Bowel sounds are normal. There is no distension.  Musculoskeletal:        General: Normal range of motion.     Cervical back: Normal range of motion and neck supple.  Skin:    General: Skin is warm and dry.     Findings: No erythema or rash.  Neurological:     Mental Status: She is alert. Mental status is at baseline.     Comments: Constant body jerks, grimace, movement  Psychiatric:        Mood and Affect: Mood normal.  LABORATORY DATA:  I have reviewed the data as listed    Latest Ref Rng & Units 05/15/2023    2:22 PM 04/03/2023    3:18 PM 02/01/2023   12:03 PM  CBC  WBC 4.0 - 10.5 K/uL 9.1  10.8  19.8   Hemoglobin 12.0 - 15.0 g/dL 9.3  96.2  95.2    Hematocrit 36.0 - 46.0 % 30.1  33.4  34.1   Platelets 150 - 400 K/uL 383  313  369       Latest Ref Rng & Units 02/01/2023   12:03 PM 09/06/2021    2:47 PM 02/10/2020    1:31 PM  CMP  Glucose 70 - 99 mg/dL 841  324  87   BUN 6 - 20 mg/dL 18  24  22    Creatinine 0.44 - 1.00 mg/dL 4.01  0.27  2.53   Sodium 135 - 145 mmol/L 138  136  140   Potassium 3.5 - 5.1 mmol/L 4.9  4.8  4.3   Chloride 98 - 111 mmol/L 105  106  112   CO2 22 - 32 mmol/L 20  24  22    Calcium 8.9 - 10.3 mg/dL 8.9  8.6  8.4   Total Protein 6.5 - 8.1 g/dL 8.1  7.6  7.4   Total Bilirubin 0.3 - 1.2 mg/dL 1.6  0.5  0.6   Alkaline Phos 38 - 126 U/L 47  47  46   AST 15 - 41 U/L 25  14  14    ALT 0 - 44 U/L 13  12  13       Iron/TIBC/Ferritin/ %Sat    Component Value Date/Time   IRON 27 (L) 05/15/2023 1422   TIBC 280 05/15/2023 1422   FERRITIN 16 05/15/2023 1422   IRONPCTSAT 10 (L) 05/15/2023 1422     RADIOGRAPHIC STUDIES: I have personally reviewed the radiological images as listed and agreed with the findings in the report. DG Shoulder Right Result Date: 03/13/2023 CLINICAL DATA:  48 year old female with history of right-sided shoulder pain for the past 3 weeks. EXAM: RIGHT SHOULDER - 2+ VIEW COMPARISON:  No priors. FINDINGS: Five views of the right shoulder demonstrate no acute displaced fracture or dislocation. Humeral head is high-riding, suggesting underlying rotator cuff disease. Mild degenerative changes of osteoarthritis are noted in the glenohumeral and acromioclavicular joints. IMPRESSION: 1. No acute radiographic abnormality of the right shoulder. 2. Degenerative changes of osteoarthritis, and high-riding humeral head (suggesting rotator cuff disease), as above. Electronically Signed   By: Trudie Reed M.D.   On: 03/13/2023 06:56

## 2023-05-23 ENCOUNTER — Inpatient Hospital Stay: Payer: BC Managed Care – PPO

## 2023-05-23 VITALS — BP 115/63 | HR 61 | Temp 96.6°F | Resp 18

## 2023-05-23 DIAGNOSIS — N183 Chronic kidney disease, stage 3 unspecified: Secondary | ICD-10-CM | POA: Diagnosis not present

## 2023-05-23 DIAGNOSIS — D509 Iron deficiency anemia, unspecified: Secondary | ICD-10-CM

## 2023-05-23 MED ORDER — IRON SUCROSE 20 MG/ML IV SOLN
200.0000 mg | Freq: Once | INTRAVENOUS | Status: AC
  Administered 2023-05-23: 200 mg via INTRAVENOUS

## 2023-05-23 MED ORDER — SODIUM CHLORIDE 0.9% FLUSH
10.0000 mL | Freq: Once | INTRAVENOUS | Status: AC | PRN
  Administered 2023-05-23: 10 mL
  Filled 2023-05-23: qty 10

## 2023-05-23 MED ORDER — CYANOCOBALAMIN 1000 MCG/ML IJ SOLN
1000.0000 ug | Freq: Once | INTRAMUSCULAR | Status: AC
  Administered 2023-05-23: 1000 ug via INTRAMUSCULAR

## 2023-05-30 ENCOUNTER — Inpatient Hospital Stay: Payer: BC Managed Care – PPO

## 2023-05-30 VITALS — BP 133/83 | HR 79 | Temp 97.8°F | Resp 18

## 2023-05-30 DIAGNOSIS — D509 Iron deficiency anemia, unspecified: Secondary | ICD-10-CM

## 2023-05-30 DIAGNOSIS — N183 Chronic kidney disease, stage 3 unspecified: Secondary | ICD-10-CM | POA: Diagnosis not present

## 2023-05-30 MED ORDER — IRON SUCROSE 20 MG/ML IV SOLN
200.0000 mg | Freq: Once | INTRAVENOUS | Status: AC
  Administered 2023-05-30: 200 mg via INTRAVENOUS
  Filled 2023-05-30: qty 10

## 2023-05-30 MED ORDER — CYANOCOBALAMIN 1000 MCG/ML IJ SOLN
1000.0000 ug | Freq: Once | INTRAMUSCULAR | Status: AC
  Administered 2023-05-30: 1000 ug via INTRAMUSCULAR
  Filled 2023-05-30: qty 1

## 2023-05-30 MED ORDER — SODIUM CHLORIDE 0.9% FLUSH
10.0000 mL | Freq: Once | INTRAVENOUS | Status: AC | PRN
  Administered 2023-05-30: 10 mL
  Filled 2023-05-30: qty 10

## 2023-05-30 NOTE — Progress Notes (Signed)
[facility] Clinical Social Work  Clinical Social Work was referred by nurse for assessment of psychosocial needs.  Clinical Social Worker met with patient and her husband, Arlys John, to offer support and assess for needs.  Arlys John stated they are trying to move out of the trailer they have been living in for 26 years.  He is interested in financial assistance.  Patient is not receiving chemotherapy, so she is not eligible for the [facility].  CSW contacted supervisor to confirm other means of assistance.        Dorothey Baseman, LCSW  Clinical Social Worker [facility]

## 2023-06-06 ENCOUNTER — Telehealth: Payer: Self-pay | Admitting: Oncology

## 2023-06-06 ENCOUNTER — Inpatient Hospital Stay: Payer: BC Managed Care – PPO

## 2023-06-06 NOTE — Telephone Encounter (Signed)
Patients husband called to cancel appointment he will call back to reschedule when he gets home. Appointment cancelled as requested

## 2023-06-15 ENCOUNTER — Telehealth: Payer: Self-pay

## 2023-06-15 ENCOUNTER — Inpatient Hospital Stay: Payer: BC Managed Care – PPO | Attending: Oncology

## 2023-06-15 VITALS — BP 116/76 | HR 75 | Temp 98.1°F | Resp 18

## 2023-06-15 DIAGNOSIS — Z79899 Other long term (current) drug therapy: Secondary | ICD-10-CM | POA: Diagnosis not present

## 2023-06-15 DIAGNOSIS — D631 Anemia in chronic kidney disease: Secondary | ICD-10-CM | POA: Diagnosis present

## 2023-06-15 DIAGNOSIS — E538 Deficiency of other specified B group vitamins: Secondary | ICD-10-CM | POA: Insufficient documentation

## 2023-06-15 DIAGNOSIS — D509 Iron deficiency anemia, unspecified: Secondary | ICD-10-CM

## 2023-06-15 DIAGNOSIS — N183 Chronic kidney disease, stage 3 unspecified: Secondary | ICD-10-CM | POA: Insufficient documentation

## 2023-06-15 MED ORDER — CYANOCOBALAMIN 1000 MCG/ML IJ SOLN
1000.0000 ug | Freq: Once | INTRAMUSCULAR | Status: AC
  Administered 2023-06-15: 1000 ug via INTRAMUSCULAR
  Filled 2023-06-15: qty 1

## 2023-06-15 MED ORDER — IRON SUCROSE 20 MG/ML IV SOLN
200.0000 mg | Freq: Once | INTRAVENOUS | Status: AC
  Administered 2023-06-15: 200 mg via INTRAVENOUS
  Filled 2023-06-15: qty 10

## 2023-06-15 NOTE — Telephone Encounter (Signed)
Urgent referral faxed to [facility] for IDA, black starry stools

## 2023-06-29 NOTE — Telephone Encounter (Signed)
 Received call from [facility] stating that pt was scheduled to see them on 2/18 but cancelled/no show to appt. They will reach out to r/s

## 2023-07-04 ENCOUNTER — Inpatient Hospital Stay: Payer: BC Managed Care – PPO | Attending: Oncology

## 2023-07-04 DIAGNOSIS — D631 Anemia in chronic kidney disease: Secondary | ICD-10-CM | POA: Diagnosis present

## 2023-07-04 DIAGNOSIS — Z79899 Other long term (current) drug therapy: Secondary | ICD-10-CM | POA: Insufficient documentation

## 2023-07-04 DIAGNOSIS — N183 Chronic kidney disease, stage 3 unspecified: Secondary | ICD-10-CM | POA: Diagnosis present

## 2023-07-04 DIAGNOSIS — D509 Iron deficiency anemia, unspecified: Secondary | ICD-10-CM

## 2023-07-04 MED ORDER — CYANOCOBALAMIN 1000 MCG/ML IJ SOLN
1000.0000 ug | Freq: Once | INTRAMUSCULAR | Status: AC
  Administered 2023-07-04: 1000 ug via INTRAMUSCULAR
  Filled 2023-07-04: qty 1

## 2023-07-16 ENCOUNTER — Other Ambulatory Visit: Payer: Self-pay

## 2023-07-16 ENCOUNTER — Emergency Department
Admission: EM | Admit: 2023-07-16 | Discharge: 2023-07-17 | Disposition: A | Discharge status: Home / Self Care | Attending: Emergency Medicine | Admitting: Emergency Medicine

## 2023-07-16 DIAGNOSIS — N189 Chronic kidney disease, unspecified: Secondary | ICD-10-CM | POA: Diagnosis not present

## 2023-07-16 DIAGNOSIS — D631 Anemia in chronic kidney disease: Secondary | ICD-10-CM | POA: Insufficient documentation

## 2023-07-16 DIAGNOSIS — L03113 Cellulitis of right upper limb: Secondary | ICD-10-CM | POA: Diagnosis not present

## 2023-07-16 DIAGNOSIS — E1122 Type 2 diabetes mellitus with diabetic chronic kidney disease: Secondary | ICD-10-CM | POA: Insufficient documentation

## 2023-07-16 DIAGNOSIS — M25521 Pain in right elbow: Secondary | ICD-10-CM | POA: Diagnosis present

## 2023-07-16 HISTORY — DX: Unspecified abnormal involuntary movements: R25.9

## 2023-07-16 HISTORY — DX: Tremor, unspecified: R25.1

## 2023-07-16 LAB — CBC WITH DIFFERENTIAL/PLATELET
Abs Immature Granulocytes: 0.05 10*3/uL (ref 0.00–0.07)
Basophils Absolute: 0.1 10*3/uL (ref 0.0–0.1)
Basophils Relative: 1 %
Eosinophils Absolute: 0.2 10*3/uL (ref 0.0–0.5)
Eosinophils Relative: 2 %
HCT: 30.5 % — ABNORMAL LOW (ref 36.0–46.0)
Hemoglobin: 9.6 g/dL — ABNORMAL LOW (ref 12.0–15.0)
Immature Granulocytes: 1 %
Lymphocytes Relative: 18 %
Lymphs Abs: 1.8 10*3/uL (ref 0.7–4.0)
MCH: 30.5 pg (ref 26.0–34.0)
MCHC: 31.5 g/dL (ref 30.0–36.0)
MCV: 96.8 fL (ref 80.0–100.0)
Monocytes Absolute: 0.8 10*3/uL (ref 0.1–1.0)
Monocytes Relative: 7 %
Neutro Abs: 7.3 10*3/uL (ref 1.7–7.7)
Neutrophils Relative %: 71 %
Platelets: 466 10*3/uL — ABNORMAL HIGH (ref 150–400)
RBC: 3.15 MIL/uL — ABNORMAL LOW (ref 3.87–5.11)
RDW: 12.9 % (ref 11.5–15.5)
WBC: 10.2 10*3/uL (ref 4.0–10.5)
nRBC: 0 % (ref 0.0–0.2)

## 2023-07-16 LAB — COMPREHENSIVE METABOLIC PANEL
ALT: 11 U/L (ref 0–44)
AST: 12 U/L — ABNORMAL LOW (ref 15–41)
Albumin: 3 g/dL — ABNORMAL LOW (ref 3.5–5.0)
Alkaline Phosphatase: 42 U/L (ref 38–126)
Anion gap: 8 (ref 5–15)
BUN: 22 mg/dL — ABNORMAL HIGH (ref 6–20)
CO2: 23 mmol/L (ref 22–32)
Calcium: 8.6 mg/dL — ABNORMAL LOW (ref 8.9–10.3)
Chloride: 107 mmol/L (ref 98–111)
Creatinine, Ser: 1.6 mg/dL — ABNORMAL HIGH (ref 0.44–1.00)
GFR, Estimated: 40 mL/min — ABNORMAL LOW (ref >60)
Glucose, Bld: 239 mg/dL — ABNORMAL HIGH (ref 70–99)
Potassium: 4.7 mmol/L (ref 3.5–5.1)
Sodium: 138 mmol/L (ref 135–145)
Total Bilirubin: 0.3 mg/dL (ref 0.0–1.2)
Total Protein: 7.4 g/dL (ref 6.5–8.1)

## 2023-07-16 LAB — LACTIC ACID, PLASMA: Lactic Acid, Venous: 1.6 mmol/L (ref 0.5–1.9)

## 2023-07-16 NOTE — ED Triage Notes (Signed)
 Pt arrived POV with a possible abscess to right elbow x1 week, pt reports no injury or sores, it "just appeared and got bigger". Redness noted around the abscess with some warmth, afebrile. NAD Noted, pt is at her known baseline.

## 2023-07-17 MED ORDER — CEPHALEXIN 500 MG PO CAPS
500.0000 mg | ORAL_CAPSULE | Freq: Once | ORAL | Status: AC
  Administered 2023-07-17: 500 mg via ORAL
  Filled 2023-07-17: qty 1

## 2023-07-17 MED ORDER — HYDROCODONE-ACETAMINOPHEN 5-325 MG PO TABS
1.0000 | ORAL_TABLET | Freq: Once | ORAL | Status: AC
  Administered 2023-07-17: 1 via ORAL
  Filled 2023-07-17: qty 1

## 2023-07-17 MED ORDER — CEPHALEXIN 500 MG PO CAPS: 500.0000 mg | ORAL_CAPSULE | Freq: Four times a day (QID) | ORAL | 0 refills | Status: AC

## 2023-07-17 NOTE — Discharge Instructions (Signed)
 You are seen in the ER today for evaluation of pain and swelling in your arm.  I suspect you have an infection of your skin.  Take your antibiotics as directed.  Follow-up with your primary care doctor within few days if your symptoms are not improving.  Return to the ER for any new or worsening symptoms.

## 2023-07-17 NOTE — ED Provider Notes (Signed)
 [facility] Provider Note    Event Date/Time   First MD Initiated Contact with Patient 07/16/23 2337     (approximate)   History   Abscess   HPI  Vickie Montoya is a 48 year old female with history of T2DM, anemia, CKD, blindness presenting to the emergency department for evaluation of elbow pain.  For the last week, patient has noted some pain and swelling of her right elbow.  No identifiable injury.  Has had increased pain and swelling leading her to present to the ER.  No fevers.  No drainage from the area.      Physical Exam   Triage Vital Signs: ED Triage Vitals  Encounter Vitals Group     BP 07/16/23 2201 123/69     Systolic BP Percentile --      Diastolic BP Percentile --      Pulse Rate 07/16/23 2201 67     Resp 07/16/23 2201 19     Temp 07/16/23 2201 98.6 F (37 C)     Temp src --      SpO2 07/16/23 2201 100 %     Weight 07/16/23 2203 227 lb 1.2 oz (103 kg)     Height 07/16/23 2203 5' 7.5" (1.715 m)     Head Circumference --      Peak Flow --      Pain Score 07/16/23 2202 9     Pain Loc --      Pain Education --      Exclude from Growth Chart --     Most recent vital signs: Vitals:   07/16/23 2201  BP: 123/69  Pulse: 67  Resp: 19  Temp: 98.6 F (37 C)  SpO2: 100%     General: Awake, interactive  CV:  Regular rate, good peripheral perfusion.  Resp:  Unlabored respirations Abd:  Nondistended.  Neuro:  No gross facial asymmetry, frequent movements of her extremities that has been reports is baseline MSK:  There is an area of erythema and nonfluctuant swelling just inferior to the elbow.  Patient does not have any tenderness over the elbow joint itself and is able to freely range both passively and actively.  There are 2+ radial pulses bilaterally.  Under ultrasound, cobblestoning with scattered fluid is noted, but no well defined fluid collection is identifiable     ED Results / Procedures / Treatments   Labs (all  labs ordered are listed, but only abnormal results are displayed) Labs Reviewed  COMPREHENSIVE METABOLIC PANEL - Abnormal; Notable for the following components:      Result Value   Glucose, Bld 239 (*)    BUN 22 (*)    Creatinine, Ser 1.60 (*)    Calcium 8.6 (*)    Albumin 3.0 (*)    AST 12 (*)    GFR, Estimated 40 (*)    All other components within normal limits  CBC WITH DIFFERENTIAL/PLATELET - Abnormal; Notable for the following components:   RBC 3.15 (*)    Hemoglobin 9.6 (*)    HCT 30.5 (*)    Platelets 466 (*)    All other components within normal limits  LACTIC ACID, PLASMA     EKG EKG independently reviewed interpreted by myself (ER attending) demonstrates:    RADIOLOGY Imaging independently reviewed and interpreted by myself demonstrates:    PROCEDURES:  Critical Care performed: No  Procedures   MEDICATIONS ORDERED IN ED: Medications  HYDROcodone-acetaminophen (NORCO/VICODIN) 5-325 MG per tablet 1 tablet (  has no administration in time range)  cephALEXin (KEFLEX) capsule 500 mg (has no administration in time range)     IMPRESSION / MDM / ASSESSMENT AND PLAN / ED COURSE  I reviewed the triage vital signs and the nursing notes.  Differential diagnosis includes, but is not limited to, cellulitis, cyst, consideration for bursitis though location is somewhat inferior to where would be expected for the olecranon, very low suspicion elbow septic arthritis based on location and exam, ultrasound appearance not suggestive of abscess  Patient's presentation is most consistent with acute complicated illness / injury requiring diagnostic workup.  48 year old female presenting with elbow pain and swelling.  Labs sent from triage with stable anemia, renal dysfunction.  Normal lactate.  Noted to have cellulitis on exam with area of swelling that is not fluctuant or consistent with abscess.  This is inferior to where would expect her bursa to be located in I do not see a  clear fluid pocket on ultrasound.  No evidence of sepsis.  Will trial oral antibiotics.  Patient comfortable this plan.  Strict return precautions provided.      FINAL CLINICAL IMPRESSION(S) / ED DIAGNOSES   Final diagnoses:  Cellulitis of arm, right     Rx / DC Orders   ED Discharge Orders          Ordered    cephALEXin (KEFLEX) 500 MG capsule  4 times daily        07/17/23 0031             Note:  This document was prepared using Dragon voice recognition software and may include unintentional dictation errors.   Trinna Post, MD 07/17/23 [id number]

## 2023-07-17 NOTE — ED Notes (Addendum)
 Pt presenting to ED with abscess on R elbow, pt reports it's been there for about a week. Abscess appears, red, swollen, warm to the touch. Pt denies fevers at home.   Ambulatory, AAOx4

## 2023-08-04 ENCOUNTER — Inpatient Hospital Stay: Payer: BC Managed Care – PPO

## 2023-08-14 ENCOUNTER — Inpatient Hospital Stay (HOSPITAL_BASED_OUTPATIENT_CLINIC_OR_DEPARTMENT_OTHER): Admitting: Oncology

## 2023-08-14 ENCOUNTER — Other Ambulatory Visit: Payer: Self-pay

## 2023-08-14 ENCOUNTER — Inpatient Hospital Stay

## 2023-08-14 ENCOUNTER — Inpatient Hospital Stay: Attending: Oncology

## 2023-08-14 ENCOUNTER — Encounter: Payer: Self-pay | Admitting: Oncology

## 2023-08-14 ENCOUNTER — Other Ambulatory Visit: Payer: BC Managed Care – PPO

## 2023-08-14 VITALS — BP 131/59 | HR 59 | Temp 97.2°F | Resp 20

## 2023-08-14 VITALS — BP 118/99 | HR 58 | Temp 97.2°F | Resp 20

## 2023-08-14 DIAGNOSIS — R251 Tremor, unspecified: Secondary | ICD-10-CM | POA: Diagnosis not present

## 2023-08-14 DIAGNOSIS — N183 Chronic kidney disease, stage 3 unspecified: Secondary | ICD-10-CM

## 2023-08-14 DIAGNOSIS — D509 Iron deficiency anemia, unspecified: Secondary | ICD-10-CM

## 2023-08-14 DIAGNOSIS — Z83438 Family history of other disorder of lipoprotein metabolism and other lipidemia: Secondary | ICD-10-CM | POA: Diagnosis not present

## 2023-08-14 DIAGNOSIS — D631 Anemia in chronic kidney disease: Secondary | ICD-10-CM

## 2023-08-14 DIAGNOSIS — F1290 Cannabis use, unspecified, uncomplicated: Secondary | ICD-10-CM | POA: Diagnosis not present

## 2023-08-14 DIAGNOSIS — E538 Deficiency of other specified B group vitamins: Secondary | ICD-10-CM | POA: Diagnosis not present

## 2023-08-14 DIAGNOSIS — Z801 Family history of malignant neoplasm of trachea, bronchus and lung: Secondary | ICD-10-CM | POA: Insufficient documentation

## 2023-08-14 DIAGNOSIS — I129 Hypertensive chronic kidney disease with stage 1 through stage 4 chronic kidney disease, or unspecified chronic kidney disease: Secondary | ICD-10-CM | POA: Insufficient documentation

## 2023-08-14 DIAGNOSIS — Z7289 Other problems related to lifestyle: Secondary | ICD-10-CM | POA: Insufficient documentation

## 2023-08-14 DIAGNOSIS — M2550 Pain in unspecified joint: Secondary | ICD-10-CM | POA: Diagnosis not present

## 2023-08-14 DIAGNOSIS — Z79899 Other long term (current) drug therapy: Secondary | ICD-10-CM | POA: Insufficient documentation

## 2023-08-14 DIAGNOSIS — Z833 Family history of diabetes mellitus: Secondary | ICD-10-CM | POA: Diagnosis not present

## 2023-08-14 DIAGNOSIS — Z803 Family history of malignant neoplasm of breast: Secondary | ICD-10-CM | POA: Diagnosis not present

## 2023-08-14 DIAGNOSIS — E1122 Type 2 diabetes mellitus with diabetic chronic kidney disease: Secondary | ICD-10-CM | POA: Diagnosis not present

## 2023-08-14 DIAGNOSIS — R5383 Other fatigue: Secondary | ICD-10-CM | POA: Diagnosis not present

## 2023-08-14 LAB — CBC WITH DIFFERENTIAL (CANCER CENTER ONLY)
Abs Immature Granulocytes: 0.09 10*3/uL — ABNORMAL HIGH (ref 0.00–0.07)
Basophils Absolute: 0.1 10*3/uL (ref 0.0–0.1)
Basophils Relative: 1 %
Eosinophils Absolute: 0.4 10*3/uL (ref 0.0–0.5)
Eosinophils Relative: 5 %
HCT: 30.5 % — ABNORMAL LOW (ref 36.0–46.0)
Hemoglobin: 9.5 g/dL — ABNORMAL LOW (ref 12.0–15.0)
Immature Granulocytes: 1 %
Lymphocytes Relative: 26 %
Lymphs Abs: 2.1 10*3/uL (ref 0.7–4.0)
MCH: 30.6 pg (ref 26.0–34.0)
MCHC: 31.1 g/dL (ref 30.0–36.0)
MCV: 98.4 fL (ref 80.0–100.0)
Monocytes Absolute: 0.6 10*3/uL (ref 0.1–1.0)
Monocytes Relative: 7 %
Neutro Abs: 4.8 10*3/uL (ref 1.7–7.7)
Neutrophils Relative %: 60 %
Platelet Count: 303 10*3/uL (ref 150–400)
RBC: 3.1 MIL/uL — ABNORMAL LOW (ref 3.87–5.11)
RDW: 14.5 % (ref 11.5–15.5)
WBC Count: 8 10*3/uL (ref 4.0–10.5)
nRBC: 0 % (ref 0.0–0.2)

## 2023-08-14 LAB — RETIC PANEL
Immature Retic Fract: 14.8 % (ref 2.3–15.9)
RBC.: 3.12 MIL/uL — ABNORMAL LOW (ref 3.87–5.11)
Retic Count, Absolute: 90.5 10*3/uL (ref 19.0–186.0)
Retic Ct Pct: 2.9 % (ref 0.4–3.1)
Reticulocyte Hemoglobin: 31.3 pg (ref >27.9)

## 2023-08-14 LAB — IRON AND TIBC
Iron: 140 ug/dL (ref 28–170)
Saturation Ratios: 51 % — ABNORMAL HIGH (ref 10.4–31.8)
TIBC: 277 ug/dL (ref 250–450)
UIBC: 137 ug/dL

## 2023-08-14 LAB — FERRITIN: Ferritin: 22 ng/mL (ref 11–307)

## 2023-08-14 LAB — VITAMIN B12: Vitamin B-12: 306 pg/mL (ref 180–914)

## 2023-08-14 MED ORDER — CYANOCOBALAMIN 1000 MCG/ML IJ SOLN
1000.0000 ug | Freq: Once | INTRAMUSCULAR | Status: AC
  Administered 2023-08-14: 1000 ug via INTRAMUSCULAR
  Filled 2023-08-14: qty 1

## 2023-08-14 NOTE — Assessment & Plan Note (Addendum)
 Anemia of chronic kidney disease. Labs are reviewed and discussed with patient Lab Results  Component Value Date   HGB 9.5 (L) 08/14/2023   TIBC 277 08/14/2023   IRONPCTSAT 51 (H) 08/14/2023   FERRITIN 22 08/14/2023    Recommend Venofer weekly x 2

## 2023-08-14 NOTE — Progress Notes (Signed)
 Hematology/Oncology Progress note [phone] [phone]      Patient Care Team: Ardyth Man, PA-C as PCP - General (Family Medicine) Rickard Patience, MD as Consulting Physician (Hematology and Oncology)   ASSESSMENT & PLAN:   Anemia associated with stage 3 chronic renal failure (HCC) Anemia of chronic kidney disease. Labs are reviewed and discussed with patient Lab Results  Component Value Date   HGB 9.5 (L) 08/14/2023   TIBC 277 08/14/2023   IRONPCTSAT 51 (H) 08/14/2023   FERRITIN 22 08/14/2023    Recommend Venofer weekly x 2  B12 deficiency Continue Vitamin B12 IM monthly.  B12 level is pending  Orders Placed This Encounter  Procedures   CBC with Differential (Cancer Center Only)    Standing Status:   Future    Expected Date:   12/14/2023    Expiration Date:   08/13/2024   Ferritin    Standing Status:   Future    Expected Date:   12/14/2023    Expiration Date:   08/13/2024   Iron and TIBC    Standing Status:   Future    Expected Date:   12/14/2023    Expiration Date:   08/13/2024   Vitamin B12    Standing Status:   Future    Expected Date:   12/14/2023    Expiration Date:   08/13/2024   Follow up in 4 months.  All questions were answered. The patient knows to call the clinic with any problems, questions or concerns.  Rickard Patience, MD, PhD [facility] Hematology Oncology 08/14/2023   CHIEF COMPLAINTS/REASON FOR VISIT:  iron deficiency anemia  HISTORY OF PRESENTING ILLNESS:  Vickie Montoya is a  48 y.o.  female with PMH listed below was seen in consultation at the request of Ardyth Man, [place]  for evaluation of iron deficiency anemia.   Reviewed patient's recent labs  10/24/2019, labs revealed anemia with hemoglobin of 10.3, WBC 13.5, platelet count 481, differential showed neutrophilia..  Ferritin 10. Reviewed patient's previous labs ordered by primary care physician's office, anemia is chronic onset , duration is since at least 2009.   Chronic decreased iron saturation and ferritin. No aggravating or improving factors. Patient follows up with neurology Dr. Malvin Johns for facial spasms, body jerking and tremors. Associated signs and symptoms: Patient reports fatigue.  Denies SOB with exertion.  Denies weight loss, easy bruising, hematochezia, hemoptysis, hematuria. Context:  History of iron deficiency: Patient has been on oral iron supplementation Rectal bleeding: Denies Menstrual bleeding/ Vaginal bleeding : Patient reports normal flow, usually last 3 days. hematemesis or hemoptysis : denies Blood in urine : denies   INTERVAL HISTORY Vickie Montoya is a 48 y.o. female who has above history reviewed by me today presents for follow up visit for management of anemia.  Patient reports feeling well.  She was accompanied by her husband. No new complaints.  + fatigue  Review of Systems  Constitutional:  Positive for fatigue.  Respiratory:  Negative for cough and shortness of breath.   Cardiovascular:  Negative for chest pain.  Gastrointestinal:  Negative for abdominal pain.  Genitourinary:  Negative for dysuria.   Musculoskeletal:  Positive for arthralgias. Negative for back pain.  Neurological:        Body jerks patient spasms and tremors.  Psychiatric/Behavioral:  Negative for confusion.     MEDICAL HISTORY:  Past Medical History:  Diagnosis Date   Blind    Chronic kidney disease (CKD), stage III (moderate) (HCC)  Depression    Diabetes mellitus without complication (HCC)    Hypertension    Involuntary movements    Migraine    Shakes    Tremors of nervous system     SURGICAL HISTORY: Past Surgical History:  Procedure Laterality Date   CESAREAN SECTION     COLONOSCOPY WITH PROPOFOL N/A 05/23/2022   Procedure: COLONOSCOPY WITH PROPOFOL;  Surgeon: Shane Darling, MD;  Location: [facility] ENDOSCOPY;  Service: Endoscopy;  Laterality: N/A;  legally blind   ESOPHAGOGASTRODUODENOSCOPY (EGD) WITH PROPOFOL N/A  05/23/2022   Procedure: ESOPHAGOGASTRODUODENOSCOPY (EGD) WITH PROPOFOL;  Surgeon: Shane Darling, MD;  Location: [facility] ENDOSCOPY;  Service: Endoscopy;  Laterality: N/A;   FRACTURE SURGERY     ORIF TOE FRACTURE Left 01/18/2019   Procedure: OPEN REDUCTION INTERNAL FIXATION (ORIF) METATARSAL (TOE) FRACTURE;  Surgeon: Anell Baptist, DPM;  Location: [facility] ORS;  Service: Podiatry;  Laterality: Left;   TUBAL LIGATION     tubiligation      SOCIAL HISTORY: Social History   Socioeconomic History   Marital status: Married    Spouse name: Not on file   Number of children: Not on file   Years of education: Not on file   Highest education level: Not on file  Occupational History   Occupation: disabled   Tobacco Use   Smoking status: Never   Smokeless tobacco: Never  Vaping Use   Vaping status: Never Used  Substance and Sexual Activity   Alcohol use: Yes    Comment: occasional    Drug use: Yes    Types: Marijuana   Sexual activity: Not on file  Other Topics Concern   Not on file  Social History Narrative   Not on file   Social Drivers of Health   Financial Resource Strain: Low Risk  (05/04/2022)   Received from [facility], [facility]   Overall Financial Resource Strain (CARDIA)    Difficulty of Paying Living Expenses: Not hard at all  Food Insecurity: Food Insecurity Present (05/04/2022)   Received from [facility], [facility]   Hunger Vital Sign    Worried About Running Out of Food in the Last Year: Sometimes true    Ran Out of Food in the Last Year: Sometimes true  Transportation Needs: No Transportation Needs (05/04/2022)   Received from [facility], [facility]   [facility] - Transportation    In the past 12 months, has lack of transportation kept you from medical appointments or from getting medications?: No    Lack of Transportation (Non-Medical): No  Physical Activity:  Not on file  Stress: Not on file  Social Connections: Not on file  Intimate Partner Violence: Not on file    FAMILY HISTORY: Family History  Problem Relation Age of Onset   Hyperlipidemia Mother    Diabetes Father    Breast cancer Maternal Aunt    Lung cancer Maternal Uncle     ALLERGIES:  is allergic to sitagliptin.  MEDICATIONS:  Current Outpatient Medications  Medication Sig Dispense Refill   amLODipine (NORVASC) 10 MG tablet Take 10 mg by mouth daily.     aspirin EC 81 MG tablet Take 81 mg by mouth at bedtime.      carvedilol (COREG) 12.5 MG tablet Take 25 mg by mouth 2 (two) times daily.     cetirizine (ZYRTEC) 10 MG tablet Take 10 mg by mouth daily.     [employer] VITAMIN C 250  MG tablet TAKE 1 TABLET BY MOUTH EVERY DAY 100 tablet 1   cyanocobalamin 1000 MCG tablet Take by mouth.     Dulaglutide 0.75 MG/0.5ML SOPN Inject into the skin. Inject 0.5 mLs (0.75 mg total) subcutaneously once a week for 30 days (Patient not taking: Reported on 01/18/2023)     escitalopram (LEXAPRO) 10 MG tablet Take by mouth. (Patient not taking: Reported on 01/18/2023)     ferrous sulfate 325 (65 FE) MG tablet TAKE 1 TABLET BY MOUTH 2 TIMES DAILY WITH A MEAL. 180 tablet 1   hydrALAZINE (APRESOLINE) 50 MG tablet Take 50 mg by mouth every 8 (eight) hours.     insulin detemir (LEVEMIR) 100 UNIT/ML injection Inject 54 Units into the skin at bedtime.  (Patient not taking: Reported on 09/16/2022)     insulin glargine (LANTUS SOLOSTAR) 100 UNIT/ML Solostar Pen Inject into the skin. (Patient not taking: Reported on 01/18/2023)     LANTUS SOLOSTAR 100 UNIT/ML Solostar Pen Inject into the skin.     lisinopril (PRINIVIL,ZESTRIL) 20 MG tablet Take 20 mg by mouth daily.      metoCLOPramide (REGLAN) 10 MG tablet Take 1 tablet (10 mg total) by mouth 3 (three) times daily with meals. 30 tablet 0   metroNIDAZOLE (FLAGYL) 500 MG tablet Take 500 mg by mouth 2 (two) times daily. (Patient not taking: Reported on 03/14/2022)      pravastatin (PRAVACHOL) 10 MG tablet Take 10 mg by mouth at bedtime.      No current facility-administered medications for this visit.     PHYSICAL EXAMINATION: ECOG PERFORMANCE STATUS: 2 - Symptomatic, <50% confined to bed Vitals:   08/14/23 1459  BP: (!) 118/99  Pulse: (!) 58  Resp: 20  Temp: (!) 97.2 F (36.2 C)  SpO2: 98%   There were no vitals filed for this visit.   Physical Exam Constitutional:      General: She is not in acute distress. HENT:     Head: Normocephalic and atraumatic.  Eyes:     Comments: Not able to open her eyes.  Cardiovascular:     Rate and Rhythm: Normal rate and regular rhythm.  Pulmonary:     Effort: Pulmonary effort is normal. No respiratory distress.     Breath sounds: No wheezing.  Abdominal:     General: Bowel sounds are normal. There is no distension.  Musculoskeletal:        General: Normal range of motion.     Cervical back: Normal range of motion and neck supple.  Skin:    General: Skin is warm and dry.     Findings: No erythema or rash.  Neurological:     Mental Status: She is alert. Mental status is at baseline.     Comments: Constant body jerks, grimace, movement  Psychiatric:        Mood and Affect: Mood normal.       LABORATORY DATA:  I have reviewed the data as listed    Latest Ref Rng & Units 08/14/2023    2:52 PM 07/16/2023   10:07 PM 05/15/2023    2:22 PM  CBC  WBC 4.0 - 10.5 K/uL 8.0  10.2  9.1   Hemoglobin 12.0 - 15.0 g/dL 9.5  9.6  9.3   Hematocrit 36.0 - 46.0 % 30.5  30.5  30.1   Platelets 150 - 400 K/uL 303  466  383       Latest Ref Rng & Units 07/16/2023   10:07 PM  02/01/2023   12:03 PM 09/06/2021    2:47 PM  CMP  Glucose 70 - 99 mg/dL 782  956  213   BUN 6 - 20 mg/dL 22  18  24    Creatinine 0.44 - 1.00 mg/dL 0.86  5.78  4.69   Sodium 135 - 145 mmol/L 138  138  136   Potassium 3.5 - 5.1 mmol/L 4.7  4.9  4.8   Chloride 98 - 111 mmol/L 107  105  106   CO2 22 - 32 mmol/L 23  20  24    Calcium 8.9 -  10.3 mg/dL 8.6  8.9  8.6   Total Protein 6.5 - 8.1 g/dL 7.4  8.1  7.6   Total Bilirubin 0.0 - 1.2 mg/dL 0.3  1.6  0.5   Alkaline Phos 38 - 126 U/L 42  47  47   AST 15 - 41 U/L 12  25  14    ALT 0 - 44 U/L 11  13  12       Iron/TIBC/Ferritin/ %Sat    Component Value Date/Time   IRON 140 08/14/2023 1452   TIBC 277 08/14/2023 1452   FERRITIN 22 08/14/2023 1452   IRONPCTSAT 51 (H) 08/14/2023 1452     RADIOGRAPHIC STUDIES: I have personally reviewed the radiological images as listed and agreed with the findings in the report. No results found.

## 2023-08-14 NOTE — Assessment & Plan Note (Signed)
 Continue Vitamin B12 IM monthly.  B12 level is pending

## 2023-08-14 NOTE — Progress Notes (Signed)
 Per Dr. Wilhelmenia Harada, B12 injection today, no iron. Labs still pending.

## 2023-08-15 ENCOUNTER — Telehealth: Payer: Self-pay

## 2023-08-15 LAB — INTRINSIC FACTOR ANTIBODIES: Intrinsic Factor: 1 [AU]/ml (ref 0.0–1.1)

## 2023-08-15 LAB — ANTI-PARIETAL ANTIBODY: Parietal Cell Antibody-IgG: 1.5 U (ref 0.0–20.0)

## 2023-08-15 NOTE — Telephone Encounter (Signed)
-----   Message from Timmy Forbes sent at 08/14/2023  7:48 PM EDT ----- Please arrange patient to get Venofer q 2weeks x 2 Thanks.

## 2023-08-15 NOTE — Telephone Encounter (Signed)
 Please contact pt to schedule:   Venofer every  weeks x2  monthly B12 injection x3 Keep appts in Aug as scheduled

## 2023-08-16 ENCOUNTER — Ambulatory Visit: Payer: BC Managed Care – PPO

## 2023-08-16 ENCOUNTER — Ambulatory Visit: Payer: BC Managed Care – PPO | Admitting: Oncology

## 2023-08-21 ENCOUNTER — Other Ambulatory Visit: Payer: Self-pay | Admitting: Oncology

## 2023-08-21 ENCOUNTER — Inpatient Hospital Stay

## 2023-08-21 VITALS — BP 118/73 | HR 56 | Temp 97.4°F | Resp 18

## 2023-08-21 DIAGNOSIS — I129 Hypertensive chronic kidney disease with stage 1 through stage 4 chronic kidney disease, or unspecified chronic kidney disease: Secondary | ICD-10-CM | POA: Diagnosis not present

## 2023-08-21 DIAGNOSIS — D509 Iron deficiency anemia, unspecified: Secondary | ICD-10-CM

## 2023-08-21 MED ORDER — IRON SUCROSE 20 MG/ML IV SOLN
200.0000 mg | Freq: Once | INTRAVENOUS | Status: AC
  Administered 2023-08-21: 200 mg via INTRAVENOUS

## 2023-08-22 ENCOUNTER — Encounter: Payer: Self-pay | Admitting: Oncology

## 2023-08-24 IMAGING — MG MM DIGITAL SCREENING BILAT W/ TOMO AND CAD
8 series · 8 of 24 positions shown · non-contrast
Comparison: Previous exam(s).

ACR Breast Density Category a: The breast tissue is almost entirely
fatty.

CLINICAL DATA: Screening.

EXAM:
DIGITAL SCREENING BILATERAL MAMMOGRAM WITH TOMOSYNTHESIS AND CAD
TECHNIQUE: Bilateral screening digital craniocaudal and mediolateral oblique
mammograms were obtained. Bilateral screening digital breast
tomosynthesis was performed. The images were evaluated with
computer-aided detection.

[L MLO synth-2D]
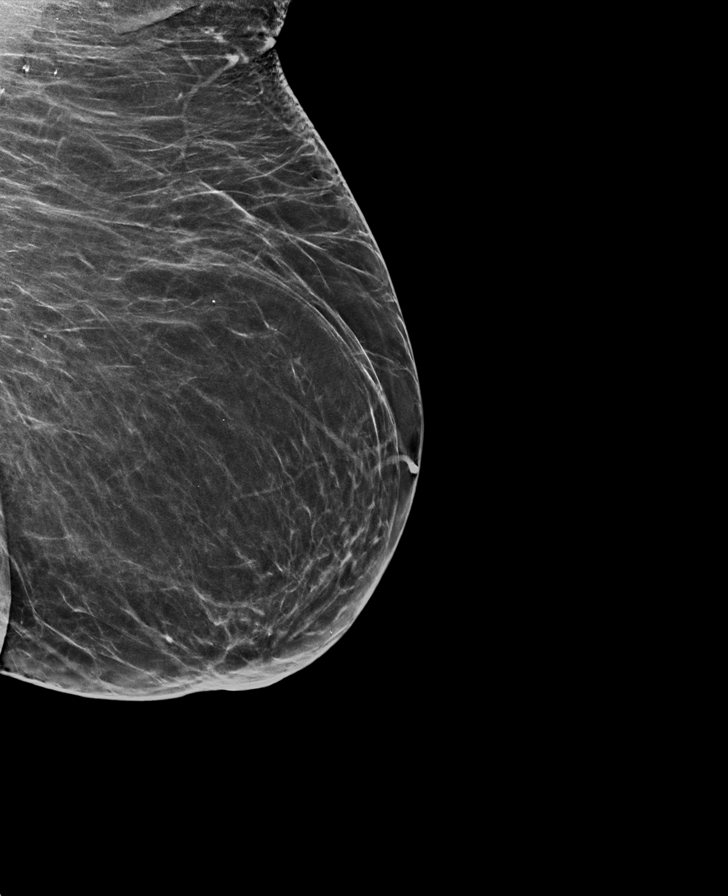

[R CC synth-2D]
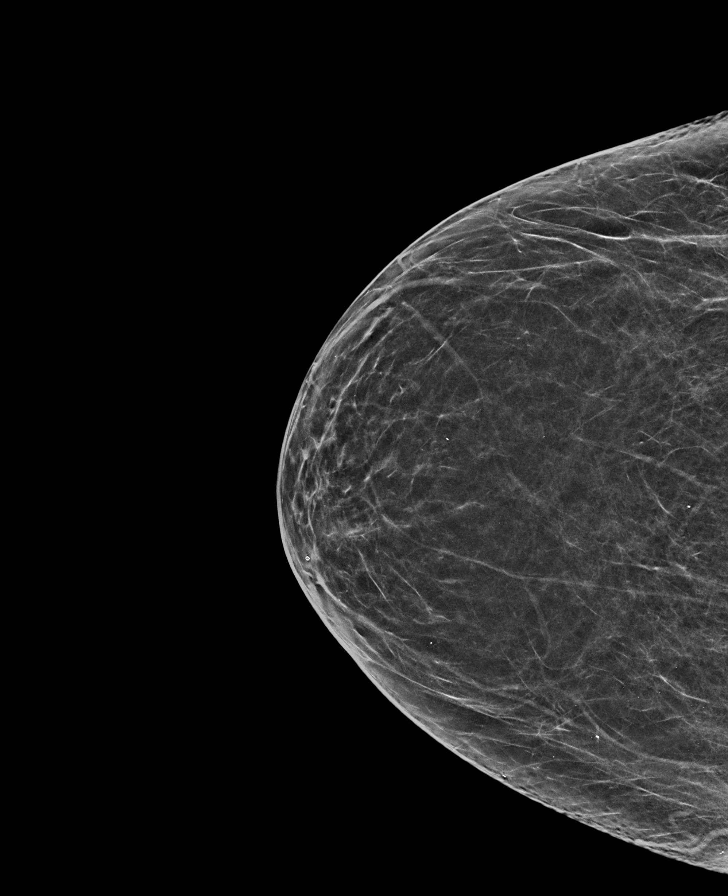

[L CC synth-2D]
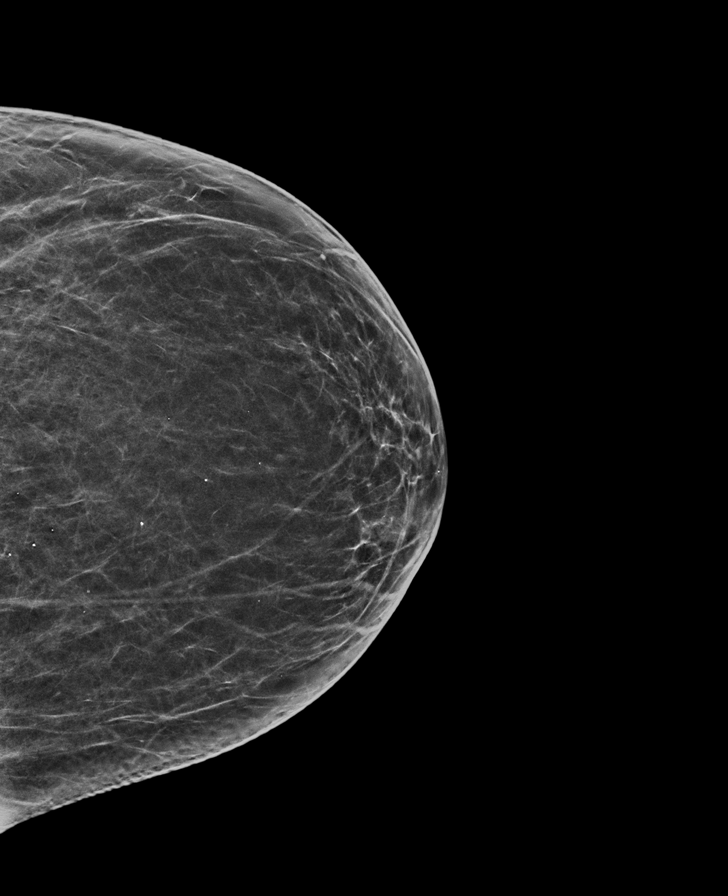

[R MLO synth-2D]
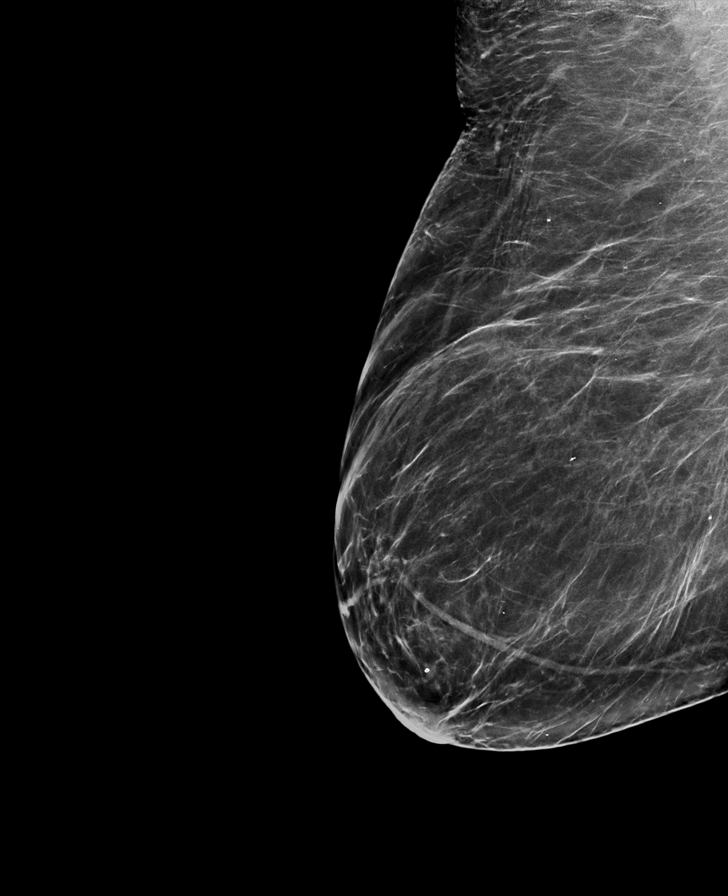

[L CC tomo · tomo slice 32/63.0]
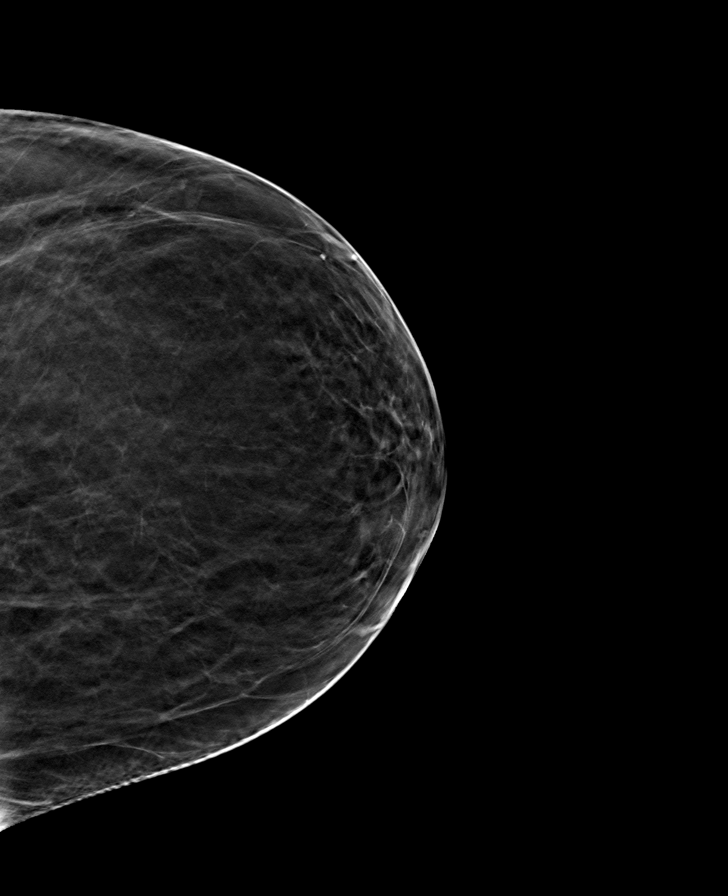

[R CC tomo · tomo slice 29/58.0]
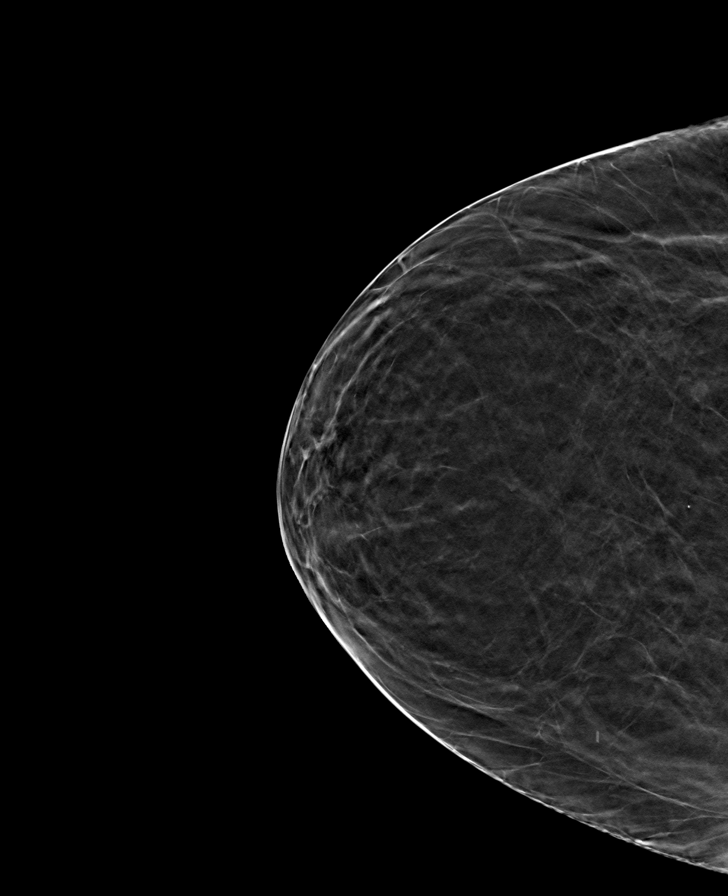

[L MLO tomo · tomo slice 37/72.0]
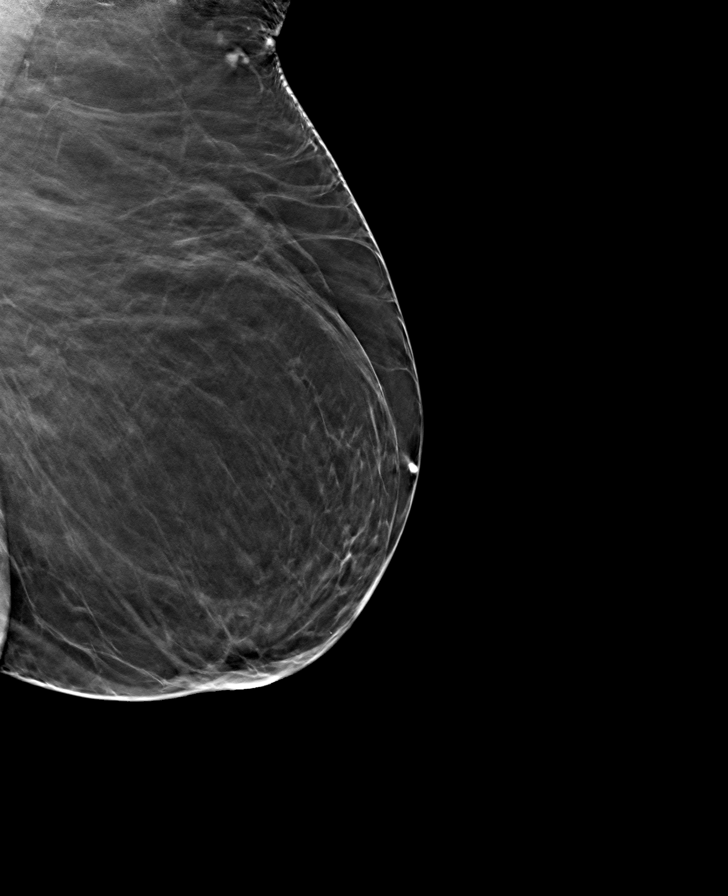

[R MLO tomo · tomo slice 40/79.0]
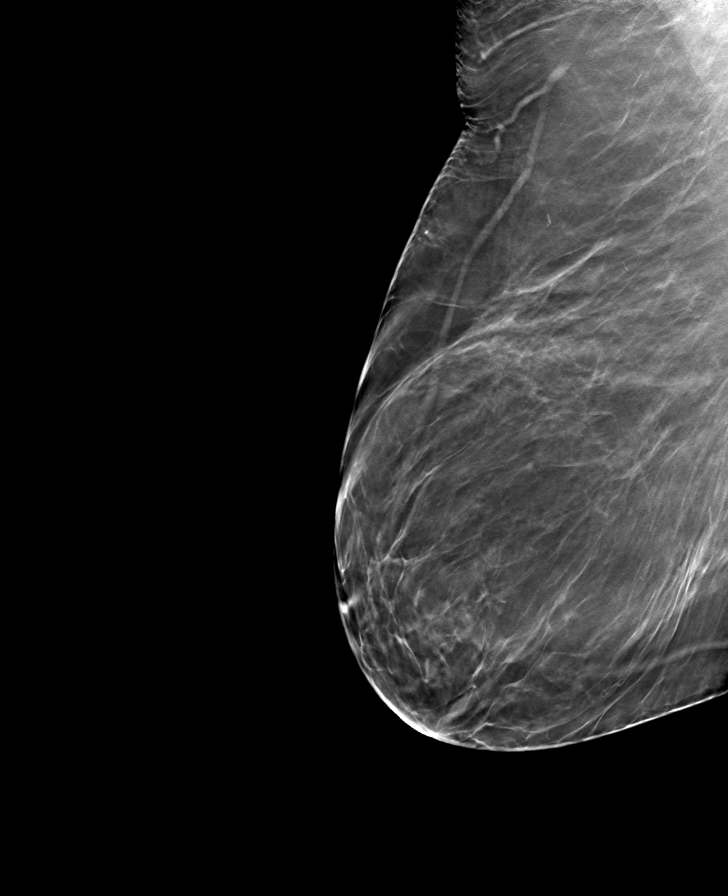

[8 of 24 positions shown; findings below may reference images not displayed]

FINDINGS: There are no findings suspicious for malignancy.
IMPRESSION: No mammographic evidence of malignancy. A result letter of this
screening mammogram will be mailed directly to the patient.

RECOMMENDATION:
Screening mammogram in one year. (Code:0E-3-N98)

BI-RADS CATEGORY  1: Negative.

## 2023-08-28 ENCOUNTER — Inpatient Hospital Stay

## 2023-08-28 VITALS — BP 136/82 | HR 57 | Temp 97.2°F | Resp 18

## 2023-08-28 DIAGNOSIS — D509 Iron deficiency anemia, unspecified: Secondary | ICD-10-CM

## 2023-08-28 DIAGNOSIS — I129 Hypertensive chronic kidney disease with stage 1 through stage 4 chronic kidney disease, or unspecified chronic kidney disease: Secondary | ICD-10-CM | POA: Diagnosis not present

## 2023-08-28 MED ORDER — IRON SUCROSE 20 MG/ML IV SOLN
200.0000 mg | Freq: Once | INTRAVENOUS | Status: AC
  Administered 2023-08-28: 200 mg via INTRAVENOUS

## 2023-09-13 ENCOUNTER — Other Ambulatory Visit: Payer: Self-pay | Admitting: *Deleted

## 2023-09-13 ENCOUNTER — Inpatient Hospital Stay: Attending: Oncology

## 2023-09-13 DIAGNOSIS — E538 Deficiency of other specified B group vitamins: Secondary | ICD-10-CM | POA: Insufficient documentation

## 2023-09-13 DIAGNOSIS — Z79899 Other long term (current) drug therapy: Secondary | ICD-10-CM | POA: Diagnosis not present

## 2023-09-13 DIAGNOSIS — D631 Anemia in chronic kidney disease: Secondary | ICD-10-CM | POA: Insufficient documentation

## 2023-09-13 DIAGNOSIS — D509 Iron deficiency anemia, unspecified: Secondary | ICD-10-CM

## 2023-09-13 DIAGNOSIS — N183 Chronic kidney disease, stage 3 unspecified: Secondary | ICD-10-CM | POA: Diagnosis present

## 2023-09-13 MED ORDER — BD LUER-LOK SYRINGE 25G X 1" 3 ML MISC: 1.0000 | 0 refills | Status: AC

## 2023-09-13 MED ORDER — CYANOCOBALAMIN 1000 MCG/ML IJ SOLN
1000.0000 ug | Freq: Once | INTRAMUSCULAR | Status: AC
  Administered 2023-09-13: 1000 ug via INTRAMUSCULAR
  Filled 2023-09-13: qty 1

## 2023-09-13 MED ORDER — CYANOCOBALAMIN 1000 MCG/ML IJ SOLN
1000.0000 ug | Freq: Once | INTRAMUSCULAR | 0 refills | Status: AC
Start: 2023-09-13 — End: 2023-09-13

## 2023-09-13 NOTE — Progress Notes (Signed)
 Per patient's husband, he would like patient to be able to receive the b12 injections at home. Husband requested to be taught how to administer the b12 injection. Rn provided directions on administering the injections. teach back process performed with pt's husband. Husband repeated back the instructions. pt's husband was teachable caregiver.   Msg sent to Dr. Jackqueline Mason team to arrange for b12 injection and supplies to be [facility]. If md approves home admin of b12 injections, pt's husband would like to be called to confirm that future b12 injection monthly apts would be cnl.

## 2023-09-13 NOTE — Telephone Encounter (Signed)
 Patient came for her b12 injection apt today. She would like to have a prescription sent to her pharmacy to be able to self admin the next b12 injections at home. Pt not due to receive next dose until 1 month from now. I did educate patient's husband on the proper way to admin the b12 injection. Teach back process performed.

## 2023-09-14 NOTE — Telephone Encounter (Signed)
 Spoke to pt's husband Geraldean Klein and informed him that B12 inj rx has been sent along with rx for syringe-needle. Informed him that she will need and injection for May, June and July and we will cancel those appts. Advised for Aug dose not to be given until she see's Dr. Wilhelmenia Harada. He verbalized understanding.   Please cancel Inj for May, June and July. Keep Aug appts as scheduled.

## 2023-10-16 ENCOUNTER — Inpatient Hospital Stay: Attending: Oncology

## 2023-11-10 ENCOUNTER — Emergency Department

## 2023-11-10 ENCOUNTER — Emergency Department
Admission: EM | Admit: 2023-11-10 | Discharge: 2023-11-11 | Disposition: A | Discharge status: Home / Self Care | Attending: Emergency Medicine | Admitting: Emergency Medicine

## 2023-11-10 ENCOUNTER — Other Ambulatory Visit: Payer: Self-pay

## 2023-11-10 DIAGNOSIS — R112 Nausea with vomiting, unspecified: Secondary | ICD-10-CM | POA: Insufficient documentation

## 2023-11-10 DIAGNOSIS — E1165 Type 2 diabetes mellitus with hyperglycemia: Secondary | ICD-10-CM | POA: Insufficient documentation

## 2023-11-10 DIAGNOSIS — E1122 Type 2 diabetes mellitus with diabetic chronic kidney disease: Secondary | ICD-10-CM | POA: Diagnosis not present

## 2023-11-10 DIAGNOSIS — N189 Chronic kidney disease, unspecified: Secondary | ICD-10-CM | POA: Insufficient documentation

## 2023-11-10 DIAGNOSIS — R197 Diarrhea, unspecified: Secondary | ICD-10-CM | POA: Insufficient documentation

## 2023-11-10 DIAGNOSIS — N939 Abnormal uterine and vaginal bleeding, unspecified: Secondary | ICD-10-CM | POA: Diagnosis present

## 2023-11-10 DIAGNOSIS — R319 Hematuria, unspecified: Secondary | ICD-10-CM

## 2023-11-10 DIAGNOSIS — N390 Urinary tract infection, site not specified: Secondary | ICD-10-CM | POA: Insufficient documentation

## 2023-11-10 DIAGNOSIS — D649 Anemia, unspecified: Secondary | ICD-10-CM | POA: Insufficient documentation

## 2023-11-10 DIAGNOSIS — D72829 Elevated white blood cell count, unspecified: Secondary | ICD-10-CM | POA: Insufficient documentation

## 2023-11-10 DIAGNOSIS — E871 Hypo-osmolality and hyponatremia: Secondary | ICD-10-CM | POA: Insufficient documentation

## 2023-11-10 LAB — TYPE AND SCREEN
ABO/RH(D): A POS
Antibody Screen: NEGATIVE

## 2023-11-10 LAB — CBC
HCT: 32.2 % — ABNORMAL LOW (ref 36.0–46.0)
Hemoglobin: 10.5 g/dL — ABNORMAL LOW (ref 12.0–15.0)
MCH: 30.5 pg (ref 26.0–34.0)
MCHC: 32.6 g/dL (ref 30.0–36.0)
MCV: 93.6 fL (ref 80.0–100.0)
Platelets: 369 K/uL (ref 150–400)
RBC: 3.44 MIL/uL — ABNORMAL LOW (ref 3.87–5.11)
RDW: 12.8 % (ref 11.5–15.5)
WBC: 18.7 K/uL — ABNORMAL HIGH (ref 4.0–10.5)
nRBC: 0 % (ref 0.0–0.2)

## 2023-11-10 LAB — URINALYSIS, ROUTINE W REFLEX MICROSCOPIC
Bilirubin Urine: NEGATIVE
Glucose, UA: >=500 mg/dL — AB
Ketones, ur: 20 mg/dL — AB
Leukocytes,Ua: NEGATIVE
Nitrite: NEGATIVE
Protein, ur: >=300 mg/dL — AB
RBC / HPF: >50 RBC/hpf (ref 0–5)
Specific Gravity, Urine: 1.018 (ref 1.005–1.030)
WBC, UA: >50 WBC/hpf (ref 0–5)
pH: 5 (ref 5.0–8.0)

## 2023-11-10 LAB — WET PREP, GENITAL
Clue Cells Wet Prep HPF POC: NONE SEEN
Sperm: NONE SEEN
Trich, Wet Prep: NONE SEEN
WBC, Wet Prep HPF POC: <10 (ref <10)
Yeast Wet Prep HPF POC: NONE SEEN

## 2023-11-10 LAB — BASIC METABOLIC PANEL WITH GFR
Anion gap: 10 (ref 5–15)
BUN: 26 mg/dL — ABNORMAL HIGH (ref 6–20)
CO2: 20 mmol/L — ABNORMAL LOW (ref 22–32)
Calcium: 9.1 mg/dL (ref 8.9–10.3)
Chloride: 104 mmol/L (ref 98–111)
Creatinine, Ser: 1.53 mg/dL — ABNORMAL HIGH (ref 0.44–1.00)
GFR, Estimated: 42 mL/min — ABNORMAL LOW (ref >60)
Glucose, Bld: 347 mg/dL — ABNORMAL HIGH (ref 70–99)
Potassium: 4.6 mmol/L (ref 3.5–5.1)
Sodium: 134 mmol/L — ABNORMAL LOW (ref 135–145)

## 2023-11-10 LAB — LIPASE, BLOOD: Lipase: 39 U/L (ref 11–51)

## 2023-11-10 LAB — HEPATIC FUNCTION PANEL
ALT: 13 U/L (ref 0–44)
AST: 13 U/L — ABNORMAL LOW (ref 15–41)
Albumin: 3.2 g/dL — ABNORMAL LOW (ref 3.5–5.0)
Alkaline Phosphatase: 53 U/L (ref 38–126)
Bilirubin, Direct: <0.1 mg/dL (ref 0.0–0.2)
Total Bilirubin: 0.5 mg/dL (ref 0.0–1.2)
Total Protein: 7.8 g/dL (ref 6.5–8.1)

## 2023-11-10 LAB — BETA-HYDROXYBUTYRIC ACID: Beta-Hydroxybutyric Acid: 0.65 mmol/L — ABNORMAL HIGH (ref 0.05–0.27)

## 2023-11-10 LAB — POC URINE PREG, ED: Preg Test, Ur: NEGATIVE

## 2023-11-10 MED ORDER — IOHEXOL 300 MG/ML  SOLN
80.0000 mL | Freq: Once | INTRAMUSCULAR | Status: AC | PRN
  Administered 2023-11-10: 80 mL via INTRAVENOUS

## 2023-11-10 MED ORDER — ONDANSETRON HCL 4 MG/2ML IJ SOLN
4.0000 mg | Freq: Once | INTRAMUSCULAR | Status: AC
  Administered 2023-11-10: 4 mg via INTRAVENOUS
  Filled 2023-11-10: qty 2

## 2023-11-10 MED ORDER — CEPHALEXIN 500 MG PO CAPS
500.0000 mg | ORAL_CAPSULE | Freq: Once | ORAL | Status: AC
  Administered 2023-11-10: 500 mg via ORAL
  Filled 2023-11-10: qty 1

## 2023-11-10 MED ORDER — SODIUM CHLORIDE 0.9 % IV BOLUS
1000.0000 mL | Freq: Once | INTRAVENOUS | Status: AC
  Administered 2023-11-10: 1000 mL via INTRAVENOUS

## 2023-11-10 MED ORDER — CEPHALEXIN 500 MG PO CAPS: 500.0000 mg | ORAL_CAPSULE | Freq: Two times a day (BID) | ORAL | 0 refills | Status: AC

## 2023-11-10 MED ORDER — MORPHINE SULFATE (PF) 4 MG/ML IV SOLN
4.0000 mg | Freq: Once | INTRAVENOUS | Status: AC
  Administered 2023-11-10: 4 mg via INTRAVENOUS
  Filled 2023-11-10: qty 1

## 2023-11-10 NOTE — ED Triage Notes (Signed)
 Pt to Ed via POV from home. Pt reports vaginal bleeding with large clots x1 day. Pt reports lower abd pressure. Pt states irregular periods. Pt denies blood thinners. Pt reports N/V/D.

## 2023-11-10 NOTE — ED Provider Triage Note (Signed)
 Emergency Medicine Provider Triage Evaluation Note  Vickie Montoya , a 48 y.o. female  was evaluated in triage.  Pt complains of vaginal bleeding w/ large clots x 1 day and lower abdominal pain extending into bladder region. N/V/D also.   Physical Exam  BP 136/81   Pulse 95   Temp 98.1 F (36.7 C) (Oral)   Resp 18   SpO2 98%  Gen:   Awake, no distress   Resp:  Normal effort  MSK:   Moves extremities without difficulty  Other:  TTP diffusely along right and lower abd quadrants.  Medical Decision Making  Medically screening exam initiated at 2:40 PM.  Appropriate orders placed.  Vickie Montoya was informed that the remainder of the evaluation will be completed by another provider, this initial triage assessment does not replace that evaluation, and the importance of remaining in the ED until their evaluation is complete.  T&S, NPO, BMP, CBC, Lipase, CT abd pelvis, UPT, UA   Sheron Salm, PA-C 11/10/23 1442

## 2023-11-10 NOTE — Discharge Instructions (Signed)
 Please take the antibiotics as prescribed.  This is to treat a UTI.  Your urine has been sent for further analysis.  If there are any changes in the medications that are needed, you will be contacted.  The results of the gonorrhea chlamydia test is still pending.  You will be contacted only if you test positive.  You are negative for yeast infection, bacterial vaginosis and trichomonas.  Your blood work showed an elevated white blood cell count which I believe is due to the UTI.  It was otherwise normal aside from an elevated blood sugar.  The CT scan showed thickened endometrium which is the part of your uterus.  The pelvic ultrasound also showed this as well as a fibroid.  Please schedule a follow-up appointment with the OB/GYN.  Their information is attached.  Return to the emergency department with any worsening symptoms.

## 2023-11-10 NOTE — ED Provider Notes (Signed)
 [facility] Provider Note    Event Date/Time   First MD Initiated Contact with Patient 11/10/23 1728     (approximate)   History   Vaginal Bleeding   HPI  Vickie Montoya is a 48 y.o. female with PMH of diabetes, depression, blindness, tremors of nervous system, involuntary movements, hypertension, migraines, CKD presents for evaluation of vaginal bleeding.  Patient states that she has been passing half dollar sized clots for 1 day.  She is having some lower abdominal pressure.  She endorses some nausea, vomiting, diarrhea.  Denies constipation, fevers and urinary symptoms.      Physical Exam   Triage Vital Signs: ED Triage Vitals [11/10/23 1428]  Encounter Vitals Group     BP 136/81     Girls Systolic BP Percentile      Girls Diastolic BP Percentile      Boys Systolic BP Percentile      Boys Diastolic BP Percentile      Pulse Rate 95     Resp 18     Temp 98.1 F (36.7 C)     Temp Source Oral     SpO2 98 %     Weight      Height      Head Circumference      Peak Flow      Pain Score 9     Pain Loc      Pain Education      Exclude from Growth Chart     Most recent vital signs: Vitals:   11/10/23 1815 11/10/23 2245  BP: (!) 162/64 (!) 150/69  Pulse: 100 94  Resp: 18 19  Temp: 97.8 F (36.6 C) 98 F (36.7 C)  SpO2: 100% 100%   General: Awake, dry heaving very forcefully. CV:  Good peripheral perfusion.  RRR. Resp:  Normal effort.  CTAB. Abd:  No distention.  Soft, generalized tenderness to palpation. Pelvic:  No external lesions on labia or perineum, blood in vaginal vault, no cervical motion tenderness.     ED Results / Procedures / Treatments   Labs (all labs ordered are listed, but only abnormal results are displayed) Labs Reviewed  CBC - Abnormal; Notable for the following components:      Result Value   WBC 18.7 (*)    RBC 3.44 (*)    Hemoglobin 10.5 (*)    HCT 32.2 (*)    All other components within normal limits   BASIC METABOLIC PANEL WITH GFR - Abnormal; Notable for the following components:   Sodium 134 (*)    CO2 20 (*)    Glucose, Bld 347 (*)    BUN 26 (*)    Creatinine, Ser 1.53 (*)    GFR, Estimated 42 (*)    All other components within normal limits  URINALYSIS, ROUTINE W REFLEX MICROSCOPIC - Abnormal; Notable for the following components:   Color, Urine YELLOW (*)    APPearance CLOUDY (*)    Glucose, UA >=500 (*)    Hgb urine dipstick LARGE (*)    Ketones, ur 20 (*)    Protein, ur >=300 (*)    Bacteria, UA RARE (*)    All other components within normal limits  BETA-HYDROXYBUTYRIC ACID - Abnormal; Notable for the following components:   Beta-Hydroxybutyric Acid 0.65 (*)    All other components within normal limits  HEPATIC FUNCTION PANEL - Abnormal; Notable for the following components:   Albumin 3.2 (*)    AST 13 (*)  All other components within normal limits  WET PREP, GENITAL  CHLAMYDIA/NGC RT PCR ([facility] ONLY)            URINE CULTURE  LIPASE, BLOOD  POC URINE PREG, ED  TYPE AND SCREEN    RADIOLOGY  CT scan and pelvic ultrasound obtained, interpreted the images as well as reviewed the radiologist report, interpretation as below.   PROCEDURES:  Critical Care performed: No  Procedures   MEDICATIONS ORDERED IN ED: Medications  sodium chloride  0.9 % bolus 1,000 mL (0 mLs Intravenous Stopped 11/10/23 2026)  ondansetron  (ZOFRAN ) injection 4 mg (4 mg Intravenous Given 11/10/23 1834)  morphine  (PF) 4 MG/ML injection 4 mg (4 mg Intravenous Given 11/10/23 1833)  iohexol  (OMNIPAQUE ) 300 MG/ML solution 80 mL (80 mLs Intravenous Contrast Given 11/10/23 1849)  cephALEXin  (KEFLEX ) capsule 500 mg (500 mg Oral Given 11/10/23 2332)     IMPRESSION / MDM / ASSESSMENT AND PLAN / ED COURSE  I reviewed the triage vital signs and the nursing notes.                             48 year old female presents for evaluation of vaginal bleeding with nausea, vomiting and diarrhea.  Vital  signs are stable.  Patient is dry heaving forcefully on exam.  Differential diagnosis includes, but is not limited to, biliary disease, pancreatitis, hepatitis, appendicitis, gastroenteritis, menstrual bleeding, polyps, adenomyosis, leiomyoma, ectopic pregnancy, UTI.  Patient's presentation is most consistent with acute complicated illness / injury requiring diagnostic workup.  CBC notable for leukocytosis at 18.7 and anemia.  Anemia is improved from patient's baseline.  BMP shows mild hyponatremia, elevated glucose, as well as elevated BUN and creatinine which is consistent with patient's baseline.  Lipase is not elevated.  Will add on a hepatic function panel and beta hydroxy to rule out DKA. Beta hydroxy is not significantly elevated. Hepatic panel is unremarkable.l  Explained to patient that I will need a urinalysis to rule out infection as well as a pregnancy test so we can proceed with imaging. Given that she is dry heaving, has abdominal tenderness and leukocytosis will start with CT abdomen and then consider a pelvic ultrasound to evaluate the bleeding.   CT abdomen pelvis shows thickened endometrium and gas within the anterior bladder.  Patient does have history of catheter placement so gas may be due to this, however we will treat patient empirically for UTI and send urine for culture.  She is given her first dose of antibiotics while in the emergency department.  Will obtain pelvic ultrasound. Patient denies any abnormal discharge and pelvic pain so do not feel work up for PID and STDs is warranted at this time.  Pelvic ultrasound shows a left intramural fibroid as well as thickened endometrium.  Recommended that she follow-up with an OB/GYN.  She was provided information on how to do so.  Did consider consulting GYN for start of hormonal medication to stop bleeding but patient does not describe having a large amount of blood loss.  She states she only needs to change her pad a couple  times a day.  Denies golf ball size clots and feeling a pad in less than an hour for more than 2 hours in a row.  Pelvic exam completed to rule out PID.  There was no cervical motion tenderness so low suspicion for this.  During pelvic exam, while patient's partner was out of the room she expressed concern for  STD exposure as her partner has other sexual partners.  Will obtain swabs for gonorrhea, chlamydia and a wet prep.  Explained to patient that she will be contacted only if these are positive.  Wet prep is negative.      FINAL CLINICAL IMPRESSION(S) / ED DIAGNOSES   Final diagnoses:  Urinary tract infection with hematuria, site unspecified     Rx / DC Orders   ED Discharge Orders          Ordered    cephALEXin  (KEFLEX ) 500 MG capsule  2 times daily        11/10/23 2330             Note:  This document was prepared using Dragon voice recognition software and may include unintentional dictation errors.   Cleaster Tinnie LABOR, PA-C 11/10/23 2340    Malvina Alm DASEN, MD 11/11/23 [phone]

## 2023-11-11 ENCOUNTER — Other Ambulatory Visit: Payer: Self-pay | Admitting: Oncology

## 2023-11-11 LAB — CHLAMYDIA/NGC RT PCR (ARMC ONLY)
Chlamydia Tr: NOT DETECTED
N gonorrhoeae: NOT DETECTED

## 2023-11-13 LAB — URINE CULTURE: Culture: >=100000 — AB

## 2023-11-14 ENCOUNTER — Encounter: Payer: Self-pay | Admitting: Oncology

## 2023-11-15 ENCOUNTER — Inpatient Hospital Stay

## 2023-11-17 ENCOUNTER — Telehealth: Payer: Self-pay | Admitting: Oncology

## 2023-11-17 NOTE — Telephone Encounter (Signed)
 Patients husband asked for the refill for her injection to be sent to the pharmacy. He said he requested it a couple of days ago and it is past time for her to have it. Please call [facility] with information.

## 2023-11-20 NOTE — Telephone Encounter (Signed)
 Discussed with MD. Manuelita to wait until lab visit in August. B12 level will be rechecked and refill will be sent if needed. Spoke to pt's husband and informed him of this.

## 2023-12-15 ENCOUNTER — Inpatient Hospital Stay: Attending: Oncology

## 2023-12-20 ENCOUNTER — Inpatient Hospital Stay

## 2023-12-20 ENCOUNTER — Encounter: Payer: Self-pay | Admitting: Oncology

## 2023-12-20 ENCOUNTER — Inpatient Hospital Stay: Admitting: Oncology

## 2024-02-04 ENCOUNTER — Other Ambulatory Visit: Payer: Self-pay | Admitting: Oncology

## 2024-02-05 ENCOUNTER — Encounter: Payer: Self-pay | Admitting: Oncology

## 2024-04-23 ENCOUNTER — Other Ambulatory Visit: Payer: Self-pay | Admitting: Oncology

## 2024-04-24 ENCOUNTER — Encounter: Payer: Self-pay | Admitting: Oncology
